# Patient Record
Sex: Female | Born: 1985 | Hispanic: No | Marital: Married | Smoking: Never smoker
Health system: Southern US, Community
[De-identification: ages and names within clinical notes are randomized; demographics above are authoritative.]

## PROBLEM LIST (undated history)

## (undated) ENCOUNTER — Inpatient Hospital Stay (HOSPITAL_COMMUNITY): Payer: Self-pay

## (undated) DIAGNOSIS — K759 Inflammatory liver disease, unspecified: Secondary | ICD-10-CM

## (undated) DIAGNOSIS — K219 Gastro-esophageal reflux disease without esophagitis: Secondary | ICD-10-CM

## (undated) DIAGNOSIS — T8859XA Other complications of anesthesia, initial encounter: Secondary | ICD-10-CM

## (undated) DIAGNOSIS — M1990 Unspecified osteoarthritis, unspecified site: Secondary | ICD-10-CM

## (undated) DIAGNOSIS — R002 Palpitations: Secondary | ICD-10-CM

## (undated) DIAGNOSIS — E559 Vitamin D deficiency, unspecified: Secondary | ICD-10-CM

## (undated) DIAGNOSIS — B181 Chronic viral hepatitis B without delta-agent: Secondary | ICD-10-CM

## (undated) DIAGNOSIS — R12 Heartburn: Secondary | ICD-10-CM

## (undated) DIAGNOSIS — M549 Dorsalgia, unspecified: Secondary | ICD-10-CM

## (undated) DIAGNOSIS — T4145XA Adverse effect of unspecified anesthetic, initial encounter: Secondary | ICD-10-CM

## (undated) DIAGNOSIS — J189 Pneumonia, unspecified organism: Secondary | ICD-10-CM

## (undated) DIAGNOSIS — E669 Obesity, unspecified: Secondary | ICD-10-CM

## (undated) DIAGNOSIS — E119 Type 2 diabetes mellitus without complications: Secondary | ICD-10-CM

## (undated) DIAGNOSIS — K589 Irritable bowel syndrome without diarrhea: Secondary | ICD-10-CM

## (undated) DIAGNOSIS — D649 Anemia, unspecified: Secondary | ICD-10-CM

## (undated) DIAGNOSIS — R609 Edema, unspecified: Secondary | ICD-10-CM

## (undated) HISTORY — DX: Anemia, unspecified: D64.9

## (undated) HISTORY — PX: CARPAL TUNNEL RELEASE: SHX101

## (undated) HISTORY — DX: Pneumonia, unspecified organism: J18.9

## (undated) HISTORY — DX: Irritable bowel syndrome, unspecified: K58.9

## (undated) HISTORY — DX: Dorsalgia, unspecified: M54.9

## (undated) HISTORY — DX: Edema, unspecified: R60.9

## (undated) HISTORY — DX: Heartburn: R12

## (undated) HISTORY — DX: Palpitations: R00.2

## (undated) HISTORY — DX: Obesity, unspecified: E66.9

## (undated) HISTORY — DX: Unspecified osteoarthritis, unspecified site: M19.90

## (undated) HISTORY — DX: Gastro-esophageal reflux disease without esophagitis: K21.9

## (undated) HISTORY — DX: Chronic viral hepatitis B without delta-agent: B18.1

## (undated) HISTORY — DX: Adverse effect of unspecified anesthetic, initial encounter: T41.45XA

## (undated) HISTORY — DX: Vitamin D deficiency, unspecified: E55.9

## (undated) HISTORY — DX: Other complications of anesthesia, initial encounter: T88.59XA

## (undated) HISTORY — PX: OTHER SURGICAL HISTORY: SHX169

## (undated) HISTORY — DX: Type 2 diabetes mellitus without complications: E11.9

---

## 2012-06-22 NOTE — L&D Delivery Note (Signed)
Delivery Note At 8:30 PM a viable female was delivered via Vaginal, Spontaneous Delivery (Presentation: ; Occiput Anterior).  APGAR: 8, 9; weight TBD.   Placenta status: Intact, Spontaneous.  Cord: 3 vessels with the following complications: None.    Anesthesia: Epidural  Episiotomy: None Lacerations: 2nd degree;Perineal Suture Repair: 3.0 vicryl Est. Blood Loss (mL): 400cc  Mom to postpartum.  Baby to nursery-stable.  Pt c/c/+2 and febrile with chorioamnionitis.  She pushed with good maternal effort to have a liveborn female via NSVD.  Placenta delivered spontaneously intact with 3 vessel cord.  Apgars 8/9. 2nd degree perineal repaired with 3.0 vicryl with good hemostatic result.  No other complications.   Taiesha Bovard L 04/02/2013, 9:16 PM

## 2013-03-06 ENCOUNTER — Encounter (HOSPITAL_COMMUNITY): Payer: Self-pay

## 2013-03-06 ENCOUNTER — Other Ambulatory Visit: Payer: Self-pay | Admitting: Advanced Practice Midwife

## 2013-03-06 ENCOUNTER — Inpatient Hospital Stay (HOSPITAL_COMMUNITY)
Admission: AD | Admit: 2013-03-06 | Discharge: 2013-03-06 | Disposition: A | Discharge status: Home / Self Care | Payer: Medicaid Other | Source: Ambulatory Visit | Attending: Obstetrics and Gynecology | Admitting: Obstetrics and Gynecology

## 2013-03-06 DIAGNOSIS — K5900 Constipation, unspecified: Secondary | ICD-10-CM | POA: Insufficient documentation

## 2013-03-06 DIAGNOSIS — N949 Unspecified condition associated with female genital organs and menstrual cycle: Secondary | ICD-10-CM

## 2013-03-06 DIAGNOSIS — O99891 Other specified diseases and conditions complicating pregnancy: Secondary | ICD-10-CM | POA: Insufficient documentation

## 2013-03-06 DIAGNOSIS — R109 Unspecified abdominal pain: Secondary | ICD-10-CM | POA: Insufficient documentation

## 2013-03-06 DIAGNOSIS — O212 Late vomiting of pregnancy: Secondary | ICD-10-CM | POA: Insufficient documentation

## 2013-03-06 DIAGNOSIS — R002 Palpitations: Secondary | ICD-10-CM | POA: Insufficient documentation

## 2013-03-06 LAB — URINALYSIS, ROUTINE W REFLEX MICROSCOPIC
Glucose, UA: NEGATIVE mg/dL
Hgb urine dipstick: NEGATIVE
Leukocytes, UA: NEGATIVE
Protein, ur: NEGATIVE mg/dL
pH: 6.5 (ref 5.0–8.0)

## 2013-03-06 MED ORDER — METOCLOPRAMIDE HCL 10 MG PO TABS: 10.0000 mg | ORAL_TABLET | Freq: Every day | ORAL | Status: DC

## 2013-03-06 NOTE — MAU Note (Signed)
Patient states she has gotten prenatal care in [place]. Has been having abdominal and back pain for about 3 days. States she has had nausea for the entire pregnancy and has been taking medication but is out of it. Now having nausea and vomiting again. Denies bleeding or leaking and reports good fetal movement.

## 2013-03-06 NOTE — MAU Provider Note (Signed)
History     CSN: [id number]  Arrival date and time: 03/06/13 1301   None     Chief Complaint  Patient presents with  . Abdominal Pain  . Nausea  . Back Pain  27 y.o. G1P0 @[redacted]w[redacted]d    HPI Comments: [name] presents today with abdominal pain and nausea with vomiting. She has pain in lower abdomen described as pulling, made worse by sitting down and sitting down while laying on her side. If she stays on her side, she feels burning pain down the back of her leg. Takes prenatal vitamins but ran out. She has also had nausea for the past two days with one episode of vomiting this morning. She takes Metoclopramide 10mg  QD for nausea, which has helped, but ran out two days ago.She drinks about 8 oz of water because she feels nauseated if she drinks more. Has not been able to eat regularly for two days. She has had prenatal care in [place] but is not able to obtain her records. She has been in the [place] for two days. Says reports having a negative HIV, RPR, and GC/CT and is Rubella immune, but does not have records. She reports good fetal movement, no contractions, no vaginal discharge, no LOF, no vaginal bleeding. She has no diarrhea. She has constipation. She also has palpitations that occur 5-6 times per day, with no chest pain or shortness of breath. She has a history of these palpitations since she was 27 years old. Sometimes they wake her from her sleep, but she does not take any medication. She is currently having palpitations. She has no blurry vision, no headache and no RUQ pain.   OB History   Grav Para Term Preterm Abortions TAB SAB Ect Mult Living   1               History reviewed. No pertinent past medical history.  History reviewed. No pertinent past surgical history.  Family History  Problem Relation Age of Onset  . Alcohol abuse Neg Hx   . Arthritis Neg Hx   . Asthma Neg Hx   . Birth defects Neg Hx   . Cancer Neg Hx   . COPD Neg Hx   . Depression Neg Hx   .  Diabetes Neg Hx   . Drug abuse Neg Hx   . Early death Neg Hx   . Hearing loss Neg Hx   . Heart disease Neg Hx   . Hyperlipidemia Neg Hx   . Hypertension Neg Hx   . Kidney disease Neg Hx   . Learning disabilities Neg Hx   . Mental illness Neg Hx   . Mental retardation Neg Hx   . Miscarriages / Stillbirths Neg Hx   . Stroke Neg Hx   . Vision loss Neg Hx     History  Substance Use Topics  . Smoking status: Never Smoker   . Smokeless tobacco: Not on file  . Alcohol Use: No    Allergies: Allergies not on file  No prescriptions prior to admission    Review of Systems  Constitutional: Negative for fever and chills.  Eyes: Negative for blurred vision and double vision.  Respiratory: Negative for shortness of breath.   Cardiovascular: Positive for palpitations. Negative for chest pain and leg swelling.  Gastrointestinal: Positive for nausea, vomiting, abdominal pain and constipation. Negative for diarrhea.  Neurological: Negative for dizziness and loss of consciousness.   Physical Exam   Blood pressure 97/78, pulse 90, temperature  98.3 F (36.8 C), temperature source Oral, resp. rate 16, height 5' 0.25" (1.53 m), weight 97.07 kg (214 lb), SpO2 100.00%.  Physical Exam  Nursing note and vitals reviewed. Constitutional: She is oriented to person, place, and time. She appears well-developed and well-nourished. No distress.  HENT:  Mouth/Throat: Oropharynx is clear and moist.  Cardiovascular: Normal rate, regular rhythm and normal heart sounds.   No murmur heard. Respiratory: Effort normal and breath sounds normal. No respiratory distress. She has no wheezes.  GI: Soft. Bowel sounds are normal. There is no tenderness. There is no rebound and no guarding.  Musculoskeletal: Normal range of motion. She exhibits edema. She exhibits no tenderness.  Neurological: She is alert and oriented to person, place, and time.  Skin: Skin is warm and dry. She is not diaphoretic. No erythema.    Dilation: Closed Effacement (%): Thick Cervical Position: Posterior Station: -3 Exam by:: Dr Netty/g morris RN  FHT: 140bpm, moderate variability, +accels, no decels, category 1 UC: none   MAU Course  Procedures 12-lead EKG for complaint of palpitations  MDM - cervical exam to check if she is dilated   Assessment and Plan   27 yo G1P0 at [redacted]w[redacted]d with round ligament pain  Assessment: #Round ligament pain #Nausea & vomiting    Plan - restarting her Reglan for her nausea and vomiting since it has worked in the past - encouraged to keep hydrated - given information about round ligament pain - will follow-up in clinic for continued prenatal care   Jacquelin Hawking, MD 03/06/2013, 2:33 PM   I have seen this patient and agree with the above resident's note. Message sent to clinic for pt to establish care.  LEFTWICH-KIRBY, Courtland Reas Certified Nurse-Midwife

## 2013-03-09 NOTE — MAU Provider Note (Signed)
Attestation of Attending Supervision of Advanced Practitioner (CNM/NP): Evaluation and management procedures were performed by the Advanced Practitioner under my supervision and collaboration.  I have reviewed the Advanced Practitioner's note and chart, and I agree with the management and plan.  Tanishi Nault 03/09/2013 8:30 AM   

## 2013-03-13 ENCOUNTER — Ambulatory Visit (INDEPENDENT_AMBULATORY_CARE_PROVIDER_SITE_OTHER): Payer: Medicaid Other | Admitting: Obstetrics & Gynecology

## 2013-03-13 ENCOUNTER — Encounter: Payer: Self-pay | Admitting: Obstetrics & Gynecology

## 2013-03-13 VITALS — BP 105/73 | Temp 97.2°F | Wt 214.5 lb

## 2013-03-13 DIAGNOSIS — Z3403 Encounter for supervision of normal first pregnancy, third trimester: Secondary | ICD-10-CM | POA: Insufficient documentation

## 2013-03-13 DIAGNOSIS — O0930 Supervision of pregnancy with insufficient antenatal care, unspecified trimester: Secondary | ICD-10-CM

## 2013-03-13 LAB — POCT URINALYSIS DIP (DEVICE)
Bilirubin Urine: NEGATIVE
Glucose, UA: NEGATIVE mg/dL
Hgb urine dipstick: NEGATIVE
Ketones, ur: NEGATIVE mg/dL
Leukocytes, UA: NEGATIVE
Nitrite: NEGATIVE
Protein, ur: NEGATIVE mg/dL
Specific Gravity, Urine: 1.02 (ref 1.005–1.030)

## 2013-03-13 NOTE — Progress Notes (Signed)
Transfer care from [place].  No complications except UTI

## 2013-03-13 NOTE — Progress Notes (Signed)
Pulse- 97  Edema-feet and sometimes fingers  Pain-in legs at night Weight gain 11-20lbs New ob packet given

## 2013-03-13 NOTE — Patient Instructions (Signed)

## 2013-03-14 LAB — HEMOGLOBINOPATHY EVALUATION
Hemoglobin Other: 2.1 % — ABNORMAL HIGH
Hgb A2 Quant: 0.3 % — ABNORMAL LOW (ref 2.2–3.2)
Hgb A: 97.6 % (ref 96.8–97.8)
Hgb S Quant: 0 %

## 2013-03-14 LAB — OBSTETRIC PANEL
Basophils Absolute: 0 10*3/uL (ref 0.0–0.1)
Eosinophils Absolute: 0.1 10*3/uL (ref 0.0–0.7)
Eosinophils Relative: 1 % (ref 0–5)
MCH: 21.1 pg — ABNORMAL LOW (ref 26.0–34.0)
MCHC: 31.3 g/dL (ref 30.0–36.0)
MCV: 67.4 fL — ABNORMAL LOW (ref 78.0–100.0)
Platelets: 140 10*3/uL — ABNORMAL LOW (ref 150–400)
RDW: 16 % — ABNORMAL HIGH (ref 11.5–15.5)

## 2013-03-14 LAB — CULTURE, OB URINE

## 2013-03-16 LAB — HGB ELECTROPHORESIS REFLEXED REPORT: Hemoglobin A2 - HGBRFX: 2.1 % (ref 1.8–3.5)

## 2013-03-23 ENCOUNTER — Encounter: Payer: Self-pay | Admitting: Obstetrics & Gynecology

## 2013-03-27 ENCOUNTER — Encounter: Payer: Self-pay | Admitting: General Practice

## 2013-03-27 ENCOUNTER — Ambulatory Visit (INDEPENDENT_AMBULATORY_CARE_PROVIDER_SITE_OTHER): Payer: Medicaid Other | Admitting: Advanced Practice Midwife

## 2013-03-27 VITALS — BP 116/67 | Temp 97.5°F | Wt 216.1 lb

## 2013-03-27 DIAGNOSIS — Z23 Encounter for immunization: Secondary | ICD-10-CM

## 2013-03-27 DIAGNOSIS — Z3403 Encounter for supervision of normal first pregnancy, third trimester: Secondary | ICD-10-CM

## 2013-03-27 DIAGNOSIS — IMO0002 Reserved for concepts with insufficient information to code with codable children: Secondary | ICD-10-CM

## 2013-03-27 DIAGNOSIS — O1203 Gestational edema, third trimester: Secondary | ICD-10-CM

## 2013-03-27 LAB — POCT URINALYSIS DIP (DEVICE)
Bilirubin Urine: NEGATIVE
Nitrite: NEGATIVE
Urobilinogen, UA: 0.2 mg/dL (ref 0.0–1.0)
pH: 5.5 (ref 5.0–8.0)

## 2013-03-27 LAB — OB RESULTS CONSOLE GBS: GBS: NEGATIVE

## 2013-03-27 NOTE — Progress Notes (Signed)
Doing well. Needs proof of pregnancy letter. Getting flu shot today. Plans to breastfeed. GBS done. Other cultures done a month ago. 2+ pedal edema, elevate. BP normal

## 2013-03-27 NOTE — Progress Notes (Signed)
P= 91 Edema noted in feet, pt. States that it was pitting earlier in legs and feet. Reports pain/ pressure at hips and pelvic area.  Pt. Failed 1hr glucose from last visit, will need to schedule and appointment for 3hr; pt. Informed and verbalizes understanding.  Pt. Wants flu vaccine.

## 2013-03-27 NOTE — Addendum Note (Signed)
Addended by: Louanna Raw on: 03/27/2013 01:26 PM   Modules accepted: Orders

## 2013-03-27 NOTE — Patient Instructions (Signed)
Edema  Edema is an abnormal build-up of fluids in tissues. Because this is partly dependent on gravity (water flows to the lowest place), it is more common in the legs and thighs (lower extremities). It is also common in the looser tissues, like around the eyes. Painless swelling of the feet and ankles is common and increases as a person ages. It may affect both legs and may include the calves or even thighs. When squeezed, the fluid may move out of the affected area and may leave a dent for a few moments.  CAUSES    Prolonged standing or sitting in one place for extended periods of time. Movement helps pump tissue fluid into the veins, and absence of movement prevents this, resulting in edema.   Varicose veins. The valves in the veins do not work as well as they should. This causes fluid to leak into the tissues.   Fluid and salt overload.   Injury, burn, or surgery to the leg, ankle, or foot, may damage veins and allow fluid to leak out.   Sunburn damages vessels. Leaky vessels allow fluid to go out into the sunburned tissues.   Allergies (from insect bites or stings, medications or chemicals) cause swelling by allowing vessels to become leaky.   Protein in the blood helps keep fluid in your vessels. Low protein, as in malnutrition, allows fluid to leak out.   Hormonal changes, including pregnancy and menstruation, cause fluid retention. This fluid may leak out of vessels and cause edema.   Medications that cause fluid retention. Examples are sex hormones, blood pressure medications, steroid treatment, or anti-depressants.   Some illnesses cause edema, especially heart failure, kidney disease, or liver disease.   Surgery that cuts veins or lymph nodes, such as surgery done for the heart or for breast cancer, may result in edema.  DIAGNOSIS   Your caregiver is usually easily able to determine what is causing your swelling (edema) by simply asking what is wrong (getting a history) and examining you (doing  a physical). Sometimes x-rays, EKG (electrocardiogram or heart tracing), and blood work may be done to evaluate for underlying medical illness.  TREATMENT   General treatment includes:   Leg elevation (or elevation of the affected body part).   Restriction of fluid intake.   Prevention of fluid overload.   Compression of the affected body part. Compression with elastic bandages or support stockings squeezes the tissues, preventing fluid from entering and forcing it back into the blood vessels.   Diuretics (also called water pills or fluid pills) pull fluid out of your body in the form of increased urination. These are effective in reducing the swelling, but can have side effects and must be used only under your caregiver's supervision. Diuretics are appropriate only for some types of edema.  The specific treatment can be directed at any underlying causes discovered. Heart, liver, or kidney disease should be treated appropriately.  HOME CARE INSTRUCTIONS    Elevate the legs (or affected body part) above the level of the heart, while lying down.   Avoid sitting or standing still for prolonged periods of time.   Avoid putting anything directly under the knees when lying down, and do not wear constricting clothing or garters on the upper legs.   Exercising the legs causes the fluid to work back into the veins and lymphatic channels. This may help the swelling go down.   The pressure applied by elastic bandages or support stockings can help reduce ankle swelling.     A low-salt diet may help reduce fluid retention and decrease the ankle swelling.   Take any medications exactly as prescribed.  SEEK MEDICAL CARE IF:   Your edema is not responding to recommended treatments.  SEEK IMMEDIATE MEDICAL CARE IF:    You develop shortness of breath or chest pain.   You cannot breathe when you lay down; or if, while lying down, you have to get up and go to the window to get your breath.   You are having increasing  swelling without relief from treatment.   You develop a fever over 102 F (38.9 C).   You develop pain or redness in the areas that are swollen.   Tell your caregiver right away if you have gained 3 lb/1.4 kg in 1 day or 5 lb/2.3 kg in a week.  MAKE SURE YOU:    Understand these instructions.   Will watch your condition.   Will get help right away if you are not doing well or get worse.  Document Released: 06/08/2005 Document Revised: 12/08/2011 Document Reviewed: 01/25/2008  [employer] Patient Information 2014 [employer].

## 2013-03-28 ENCOUNTER — Encounter: Payer: Self-pay | Admitting: Advanced Practice Midwife

## 2013-03-30 ENCOUNTER — Inpatient Hospital Stay (HOSPITAL_COMMUNITY)
Admission: AD | Admit: 2013-03-30 | Discharge: 2013-03-30 | Disposition: A | Discharge status: Home / Self Care | Payer: Medicaid Other | Source: Ambulatory Visit | Attending: Family Medicine | Admitting: Family Medicine

## 2013-03-30 ENCOUNTER — Encounter (HOSPITAL_COMMUNITY): Payer: Self-pay | Admitting: *Deleted

## 2013-03-30 DIAGNOSIS — O4700 False labor before 37 completed weeks of gestation, unspecified trimester: Secondary | ICD-10-CM | POA: Insufficient documentation

## 2013-03-30 DIAGNOSIS — R109 Unspecified abdominal pain: Secondary | ICD-10-CM | POA: Insufficient documentation

## 2013-03-30 NOTE — MAU Note (Addendum)
Started having pain in lower abd  Yesterday evening. Becoming more regular, stronger and closer.  Gush of clear fluid at 1100 yesterday, none since

## 2013-03-31 ENCOUNTER — Encounter (HOSPITAL_COMMUNITY): Payer: Self-pay | Admitting: *Deleted

## 2013-03-31 ENCOUNTER — Inpatient Hospital Stay (HOSPITAL_COMMUNITY)
Admission: AD | Admit: 2013-03-31 | Discharge: 2013-03-31 | Disposition: A | Discharge status: Home / Self Care | Payer: Medicaid Other | Source: Ambulatory Visit | Attending: Family Medicine | Admitting: Family Medicine

## 2013-03-31 DIAGNOSIS — R109 Unspecified abdominal pain: Secondary | ICD-10-CM | POA: Insufficient documentation

## 2013-03-31 DIAGNOSIS — O4703 False labor before 37 completed weeks of gestation, third trimester: Secondary | ICD-10-CM

## 2013-03-31 DIAGNOSIS — O4700 False labor before 37 completed weeks of gestation, unspecified trimester: Secondary | ICD-10-CM | POA: Insufficient documentation

## 2013-03-31 MED ORDER — BUTORPHANOL TARTRATE 1 MG/ML IJ SOLN
2.0000 mg | INTRAMUSCULAR | Status: DC | PRN
  Administered 2013-03-31: 2 mg via INTRAMUSCULAR
  Filled 2013-03-31: qty 2

## 2013-03-31 MED ORDER — ACETAMINOPHEN 325 MG PO TABS
650.0000 mg | ORAL_TABLET | Freq: Once | ORAL | Status: AC
  Administered 2013-03-31: 650 mg via ORAL
  Filled 2013-03-31: qty 2

## 2013-03-31 NOTE — MAU Note (Signed)
Pt had states earlier that she felt better, when RN entered room pt was sleeping. Cervix rechecked. Instructed pt RN would call MD but that she would be going home, EFM dced and pt told she can get up to dress. VSS. Pt then states she is hurting and doesn't think she can go home with this pain. Call to md , will give pt tylenol and d/c to home. Pt states she is hurting too bad to go home. Same reported to MD. Dr Michail Jewels in to talk to pt.

## 2013-03-31 NOTE — MAU Provider Note (Signed)
History     CSN: [id number]  Arrival date and time: 03/31/13 0248  Chief Complaint  Patient presents with  . Labor Eval   HPI [name] is a 27y.o G1P0 at 36w3 who presented with worsening abdominal pain for the past 2 days. Pt started to notice contractions become closer together, worsening in intensity. Feeling good fetal movement. No vaginal bleeding or discharge.   OB History   Grav Para Term Preterm Abortions TAB SAB Ect Mult Living   1              1. Current  History reviewed. No pertinent past medical history.  History reviewed. No pertinent past surgical history.  Family History  Problem Relation Age of Onset  . Alcohol abuse Neg Hx   . Arthritis Neg Hx   . Asthma Neg Hx   . Birth defects Neg Hx   . Cancer Neg Hx   . COPD Neg Hx   . Depression Neg Hx   . Diabetes Neg Hx   . Drug abuse Neg Hx   . Early death Neg Hx   . Hearing loss Neg Hx   . Heart disease Neg Hx   . Hyperlipidemia Neg Hx   . Hypertension Neg Hx   . Kidney disease Neg Hx   . Learning disabilities Neg Hx   . Mental illness Neg Hx   . Mental retardation Neg Hx   . Miscarriages / Stillbirths Neg Hx   . Stroke Neg Hx   . Vision loss Neg Hx     History  Substance Use Topics  . Smoking status: Never Smoker   . Smokeless tobacco: Not on file  . Alcohol Use: No    Allergies: No Known Allergies  Prescriptions prior to admission  Medication Sig Dispense Refill  . metoCLOPramide (REGLAN) 10 MG tablet Take 1 tablet (10 mg total) by mouth daily.  30 tablet  2  . Prenatal Vit-Fe Fumarate-FA (PRENATAL MULTIVITAMIN) TABS tablet Take 1 tablet by mouth daily at 12 noon.        Review of Systems  Constitutional: Negative for fever and malaise/fatigue.  HENT: Negative for congestion and sore throat.   Eyes: Negative for blurred vision and discharge.  Respiratory: Negative for cough and wheezing.   Cardiovascular: Negative for chest pain and palpitations.  Gastrointestinal: Positive for  abdominal pain. Negative for heartburn and constipation.  Genitourinary: Negative for dysuria, urgency and frequency.  Musculoskeletal: Negative for myalgias.  Skin: Negative for rash.  Neurological: Negative for dizziness, seizures, loss of consciousness, weakness and headaches.  Endo/Heme/Allergies: Does not bruise/bleed easily.  Psychiatric/Behavioral: Negative for depression.   Physical Exam   Blood pressure 109/74, pulse 83, temperature 97.8 F (36.6 C), temperature source Oral, resp. rate 22, height 5' (1.524 m), weight 97.977 kg (216 lb), last menstrual period 07/18/2012, SpO2 100.00%.  Physical Exam  Constitutional: She is oriented to person, place, and time. She appears well-developed and well-nourished.  HENT:  Head: Normocephalic and atraumatic.  Eyes: EOM are normal.  Neck: Neck supple.  Cardiovascular: Normal rate.   Respiratory: Effort normal. No respiratory distress.  GI: Soft. There is tenderness.  Musculoskeletal: Normal range of motion.  Neurological: She is alert and oriented to person, place, and time.  Skin: Skin is warm and dry.    MAU Course  Procedures  MDM Cervical exam, 1, 90, -1 anterior by [facility] RN Stadol 2gm IM q3  FHR- baseline 125, mod variable, post accels, no decls Uterine contractions irreg  every 2-4 mins Assessment and Plan  Pt is a 27y.o G1P0 at 36w3 presenting for contractions prior to term  1. Abdominal pain- false labor before 37wks; not having consistent contraction pattern nor making active cervical change upon several reassessments -s/p 2gm stadol, pain initially improved only to 8/10 but upon reassessment pt appearing much more comfortable -reviewed labor precautions with pt -encouraged to f/up at sched prenatal apptmt  2. FHR tracing- reassuring -category I  Anselm Lis 03/31/2013, 4:12 AM   I have seen and examined this patient and agree the above assessment. CRESENZO-DISHMAN,Shontae Rosiles 03/31/2013 9:03 AM

## 2013-03-31 NOTE — MAU Note (Signed)
Worsening contractions.  

## 2013-03-31 NOTE — MAU Provider Note (Signed)
Chart reviewed and agree with management and plan.  

## 2013-04-02 ENCOUNTER — Encounter (HOSPITAL_COMMUNITY): Payer: Medicaid Other | Admitting: Anesthesiology

## 2013-04-02 ENCOUNTER — Encounter (HOSPITAL_COMMUNITY): Payer: Self-pay | Admitting: *Deleted

## 2013-04-02 ENCOUNTER — Inpatient Hospital Stay (HOSPITAL_COMMUNITY)
Admission: AD | Admit: 2013-04-02 | Discharge: 2013-04-04 | DRG: 774 | Disposition: A | Discharge status: Home / Self Care | Payer: Medicaid Other | Source: Ambulatory Visit | Attending: Obstetrics & Gynecology | Admitting: Obstetrics & Gynecology

## 2013-04-02 ENCOUNTER — Inpatient Hospital Stay (HOSPITAL_COMMUNITY): Payer: Medicaid Other | Admitting: Anesthesiology

## 2013-04-02 DIAGNOSIS — O411090 Infection of amniotic sac and membranes, unspecified, unspecified trimester, not applicable or unspecified: Secondary | ICD-10-CM

## 2013-04-02 DIAGNOSIS — B1910 Unspecified viral hepatitis B without hepatic coma: Secondary | ICD-10-CM

## 2013-04-02 LAB — CBC
HCT: 34.8 % — ABNORMAL LOW (ref 36.0–46.0)
MCHC: 31.6 g/dL (ref 30.0–36.0)
Platelets: 174 10*3/uL (ref 150–400)
RBC: 5.15 MIL/uL — ABNORMAL HIGH (ref 3.87–5.11)
RDW: 15.6 % — ABNORMAL HIGH (ref 11.5–15.5)
WBC: 12.3 10*3/uL — ABNORMAL HIGH (ref 4.0–10.5)

## 2013-04-02 LAB — URINE MICROSCOPIC-ADD ON

## 2013-04-02 LAB — URINALYSIS, ROUTINE W REFLEX MICROSCOPIC
Ketones, ur: >80 mg/dL — AB
Protein, ur: NEGATIVE mg/dL
Urobilinogen, UA: 0.2 mg/dL (ref 0.0–1.0)

## 2013-04-02 LAB — RPR: RPR Ser Ql: NONREACTIVE

## 2013-04-02 LAB — ABO/RH: ABO/RH(D): O POS

## 2013-04-02 LAB — GLUCOSE, CAPILLARY: Glucose-Capillary: 96 mg/dL (ref 70–99)

## 2013-04-02 MED ORDER — OXYTOCIN 40 UNITS IN LACTATED RINGERS INFUSION - SIMPLE MED
62.5000 mL/h | INTRAVENOUS | Status: DC
  Filled 2013-04-02: qty 1000

## 2013-04-02 MED ORDER — LIDOCAINE HCL (PF) 1 % IJ SOLN
INTRAMUSCULAR | Status: DC | PRN
  Administered 2013-04-02 (×4): 4 mL

## 2013-04-02 MED ORDER — FENTANYL 2.5 MCG/ML BUPIVACAINE 1/10 % EPIDURAL INFUSION (WH - ANES)
14.0000 mL/h | INTRAMUSCULAR | Status: DC | PRN
  Administered 2013-04-02: 14 mL/h via EPIDURAL
  Filled 2013-04-02: qty 125

## 2013-04-02 MED ORDER — PHENYLEPHRINE 40 MCG/ML (10ML) SYRINGE FOR IV PUSH (FOR BLOOD PRESSURE SUPPORT)
80.0000 ug | PREFILLED_SYRINGE | INTRAVENOUS | Status: DC | PRN
  Administered 2013-04-02: 80 ug via INTRAVENOUS
  Filled 2013-04-02: qty 2

## 2013-04-02 MED ORDER — LACTATED RINGERS IV SOLN: 500.0000 mL | Freq: Once | INTRAVENOUS | Status: DC

## 2013-04-02 MED ORDER — EPHEDRINE 5 MG/ML INJ
10.0000 mg | INTRAVENOUS | Status: DC | PRN
  Filled 2013-04-02: qty 2

## 2013-04-02 MED ORDER — ONDANSETRON HCL 4 MG/2ML IJ SOLN: 4.0000 mg | Freq: Four times a day (QID) | INTRAMUSCULAR | Status: DC | PRN

## 2013-04-02 MED ORDER — GENTAMICIN SULFATE 40 MG/ML IJ SOLN
150.0000 mg | Freq: Once | INTRAVENOUS | Status: AC
  Administered 2013-04-02: 150 mg via INTRAVENOUS
  Filled 2013-04-02: qty 3.75

## 2013-04-02 MED ORDER — TERBUTALINE SULFATE 1 MG/ML IJ SOLN: 0.2500 mg | Freq: Once | INTRAMUSCULAR | Status: AC | PRN

## 2013-04-02 MED ORDER — EPHEDRINE 5 MG/ML INJ
10.0000 mg | INTRAVENOUS | Status: DC | PRN
  Filled 2013-04-02: qty 2
  Filled 2013-04-02: qty 4

## 2013-04-02 MED ORDER — GENTAMICIN SULFATE 40 MG/ML IJ SOLN
140.0000 mg | Freq: Three times a day (TID) | INTRAVENOUS | Status: DC
  Filled 2013-04-02 (×2): qty 3.5

## 2013-04-02 MED ORDER — OXYTOCIN BOLUS FROM INFUSION
500.0000 mL | INTRAVENOUS | Status: DC
  Administered 2013-04-02: 500 mL via INTRAVENOUS

## 2013-04-02 MED ORDER — OXYTOCIN 40 UNITS IN LACTATED RINGERS INFUSION - SIMPLE MED
1.0000 m[IU]/min | INTRAVENOUS | Status: DC
  Administered 2013-04-02: 2 m[IU]/min via INTRAVENOUS

## 2013-04-02 MED ORDER — LIDOCAINE HCL (PF) 1 % IJ SOLN
30.0000 mL | INTRAMUSCULAR | Status: DC | PRN
  Filled 2013-04-02 (×2): qty 30

## 2013-04-02 MED ORDER — FENTANYL CITRATE 0.05 MG/ML IJ SOLN
100.0000 ug | INTRAMUSCULAR | Status: DC | PRN
  Administered 2013-04-02: 100 ug via INTRAVENOUS
  Filled 2013-04-02: qty 2

## 2013-04-02 MED ORDER — OXYCODONE-ACETAMINOPHEN 5-325 MG PO TABS: 1.0000 | ORAL_TABLET | ORAL | Status: DC | PRN

## 2013-04-02 MED ORDER — LACTATED RINGERS IV SOLN
500.0000 mL | INTRAVENOUS | Status: DC | PRN
  Administered 2013-04-02: 500 mL via INTRAVENOUS

## 2013-04-02 MED ORDER — SODIUM CHLORIDE 0.9 % IV SOLN
2.0000 g | Freq: Four times a day (QID) | INTRAVENOUS | Status: DC
  Administered 2013-04-02: 2 g via INTRAVENOUS
  Filled 2013-04-02 (×4): qty 2000

## 2013-04-02 MED ORDER — DIPHENHYDRAMINE HCL 50 MG/ML IJ SOLN: 12.5000 mg | INTRAMUSCULAR | Status: DC | PRN

## 2013-04-02 MED ORDER — PHENYLEPHRINE 40 MCG/ML (10ML) SYRINGE FOR IV PUSH (FOR BLOOD PRESSURE SUPPORT)
80.0000 ug | PREFILLED_SYRINGE | INTRAVENOUS | Status: DC | PRN
  Filled 2013-04-02: qty 2
  Filled 2013-04-02: qty 5

## 2013-04-02 MED ORDER — CITRIC ACID-SODIUM CITRATE 334-500 MG/5ML PO SOLN: 30.0000 mL | ORAL | Status: DC | PRN

## 2013-04-02 MED ORDER — IBUPROFEN 600 MG PO TABS
600.0000 mg | ORAL_TABLET | Freq: Four times a day (QID) | ORAL | Status: DC | PRN
  Administered 2013-04-02: 600 mg via ORAL
  Filled 2013-04-02: qty 1

## 2013-04-02 MED ORDER — ACETAMINOPHEN 325 MG PO TABS
650.0000 mg | ORAL_TABLET | ORAL | Status: DC | PRN
  Administered 2013-04-02: 650 mg via ORAL
  Filled 2013-04-02: qty 2

## 2013-04-02 MED ORDER — LACTATED RINGERS IV SOLN
INTRAVENOUS | Status: DC
  Administered 2013-04-02 (×2): via INTRAVENOUS

## 2013-04-02 NOTE — Progress Notes (Signed)
ANTIBIOTIC CONSULT NOTE - INITIAL  Pharmacy Consult for  Gentamicin Indication: r/o Chorioamnionitis  No Known Allergies  Patient Measurements: Height: 5' (152.4 cm) Weight: 215 lb (97.523 kg) IBW/kg (Calculated) : 45.5 Dosing Body Weight: 61.1 kg  Vital Signs: Temp: 100.7 F (38.2 C) (10/12 1930) Temp src: Axillary (10/12 1930) BP: 116/77 mmHg (10/12 2004) Pulse Rate: 126 (10/12 2004)   Labs:  Recent Labs  04/02/13 0900  WBC 12.3*  HGB 11.0*  PLT 174   CrCl is unknown because no creatinine reading has been taken. Estimated Serum Creatinine ~ 0.8 mg%  Microbiology: Recent Results (from the past 720 hour(s))  CULTURE, OB URINE     Status: None   Collection Time    03/13/13 12:35 PM      Result Value Range Status   Colony Count >=100,000 COLONIES/ML   Final   Organism ID, Bacteria Multiple bacterial morphotypes present, none   Final   Organism ID, Bacteria predominant. Suggest appropriate recollection if    Final   Organism ID, Bacteria clinically indicated.   Final  CULTURE, BETA STREP (GROUP B ONLY)     Status: None   Collection Time    03/27/13 12:30 PM      Result Value Range Status   Organism ID, Bacteria NO GROUP B STREP (S.AGALACTIAE) ISOLATED   Final    Medical History: History reviewed. No pertinent past medical history.  Medications: Ampicillin 2gm IV Q6 hours.  Assessment: r/o chorioamnionitis, increase temp=100*7   Goal of Therapy: Peak ~6-7mg /L, Trough < 1mg /L  Plan: 1. Load Gentamicin 150mg . 2. Maintenance Gent 140mg  IV Q 8 hours to begin 8 hours after load. 3. Draw SCr if continued.   Hovey-Rankin, Marthe Dant 04/02/2013,9:02 PM

## 2013-04-02 NOTE — Progress Notes (Signed)
Morgan [name] is a 27 y.o. G1P0 at [redacted]w[redacted]d admitted for Preterm labor  Subjective: Pt more comfortable after epidural but unable to feel figengers and hands. Pt eing reevaluated by anethesia  Objective: BP 123/72  Pulse 219  Temp(Src) 98.5 F (36.9 C) (Oral)  Resp 18  Ht 5' (1.524 m)  Wt 97.523 kg (215 lb)  BMI 41.99 kg/m2  SpO2 100%  LMP 07/18/2012      FHT:  FHR: 140s bpm, variability: moderate,  accelerations:  Abscent,  decelerations:  Absent UC:   regular, every 4 minutes SVE:   Dilation: 6 Effacement (%): 100 Station: -1 Exam by:: j.Thornton, RN  Labs: Lab Results  Component Value Date   WBC 12.3* 04/02/2013   HGB 11.0* 04/02/2013   HCT 34.8* 04/02/2013   MCV 67.6* 04/02/2013   PLT 174 04/02/2013    Assessment / Plan: Spontaneous labor, progressing normally  Labor: Progressing normally and expectant management Preeclampsia:  no signs or symptoms of toxicity Fetal Wellbeing:  Category I Pain Control:  Epidural and and being reevaluated by anesthesia I/D:  n/a Anticipated MOD:  NSVD  Clemens Lachman RYAN 04/02/2013, 1:13 PM

## 2013-04-02 NOTE — Anesthesia Preprocedure Evaluation (Signed)
Anesthesia Evaluation  Patient identified by MRN, date of birth, ID band Patient awake    Reviewed: Allergy & Precautions, H&P , NPO status , Patient's Chart, lab work & pertinent test results, reviewed documented beta blocker date and time   History of Anesthesia Complications Negative for: history of anesthetic complications  Airway Mallampati: I TM Distance: >3 FB Neck ROM: full    Dental  (+) Teeth Intact Removable retainer:   Pulmonary neg pulmonary ROS,  breath sounds clear to auscultation        Cardiovascular negative cardio ROS  Rhythm:regular Rate:Normal     Neuro/Psych negative neurological ROS  negative psych ROS   GI/Hepatic negative GI ROS, Neg liver ROS,   Endo/Other  Morbid obesity  Renal/GU negative Renal ROS     Musculoskeletal   Abdominal   Peds  Hematology negative hematology ROS (+)   Anesthesia Other Findings   Reproductive/Obstetrics (+) Pregnancy                           Anesthesia Physical Anesthesia Plan  ASA: III  Anesthesia Plan: Epidural   Post-op Pain Management:    Induction:   Airway Management Planned:   Additional Equipment:   Intra-op Plan:   Post-operative Plan:   Informed Consent: I have reviewed the patients History and Physical, chart, labs and discussed the procedure including the risks, benefits and alternatives for the proposed anesthesia with the patient or authorized representative who has indicated his/her understanding and acceptance.     Plan Discussed with:   Anesthesia Plan Comments:         Anesthesia Quick Evaluation

## 2013-04-02 NOTE — Progress Notes (Signed)
Spoke with dr odom aware that fhr tachy - order to give 500cc bolus and do sve

## 2013-04-02 NOTE — Anesthesia Procedure Notes (Signed)
Epidural Patient location during procedure: OB Start time: 04/02/2013 10:25 AM  Staffing Performed by: anesthesiologist   Preanesthetic Checklist Completed: patient identified, site marked, surgical consent, pre-op evaluation, timeout performed, IV checked, risks and benefits discussed and monitors and equipment checked  Epidural Patient position: sitting Prep: site prepped and draped and DuraPrep Patient monitoring: continuous pulse ox and blood pressure Approach: midline Injection technique: LOR air  Needle:  Needle type: Tuohy  Needle gauge: 17 G Needle length: 9 cm and 9 Needle insertion depth: 8 cm Catheter type: closed end flexible Catheter size: 19 Gauge Catheter at skin depth: 13 cm Test dose: negative  Assessment Events: blood not aspirated, injection not painful, no injection resistance, negative IV test and no paresthesia  Additional Notes Discussed risk of headache, infection, bleeding, nerve injury and failed or incomplete block.  Patient voices understanding and wishes to proceed.  Epidural placed easily on first attempt.  No paresthesia.  Patient tolerated procedure well with no apparent complications.  Jasmine December, MDReason for block:procedure for pain

## 2013-04-02 NOTE — MAU Note (Signed)
Pt presents with complaints of abdominal pain and back pain throughout the night she is not able to sleep due to the pain. Denies any bleeding or leakage of fluid

## 2013-04-02 NOTE — Progress Notes (Signed)
Morgan [name] is a 27 y.o. G1P0 at [redacted]w[redacted]d admitted for Preterm labor  Subjective: Pt with epidural doing well. S/p AROM at 1610 Objective: BP 125/83  Pulse 145  Temp(Src) 98 F (36.7 C) (Oral)  Resp 18  Ht 5' (1.524 m)  Wt 97.523 kg (215 lb)  BMI 41.99 kg/m2  SpO2 100%  LMP 07/18/2012      FHT:  FHR: \150s bpm, variability: moderate,  accelerations:  Abscent,  decelerations:  Absent UC:   regular, every 2-4 minutes SVE:   Dilation: 9 Effacement (%): 90 Station: 0 Exam by:: dr Ike Bene  Labs: Lab Results  Component Value Date   WBC 12.3* 04/02/2013   HGB 11.0* 04/02/2013   HCT 34.8* 04/02/2013   MCV 67.6* 04/02/2013   PLT 174 04/02/2013    Assessment / Plan: Spontaneous labor, progressing normally  Labor: Progressing normally and expectant management Preeclampsia:  no signs or symptoms of toxicity Fetal Wellbeing:  Category II for period of fetal tachycardia in last Pain Control:  Epidural and and being reevaluated by anesthesia I/D:  n/a Anticipated MOD:  NSVD  Morgan [name] 04/02/2013, 4:14 PM

## 2013-04-02 NOTE — Progress Notes (Signed)
Dr. Rodman Pickle called to bedside for pt c/o numbness in lips and hands--epidural turned off awaiting Anesthesiologist assessment

## 2013-04-02 NOTE — H&P (Signed)
Morgan [name] is a 27 y.o. female G1P0 @ [redacted]w[redacted]d presenting for continuous pain with no relief and constant back pain. She has not been able to sleep since Friday due to pain. Watery discharge started last evening, no gush of fluid or vaginal bleeding. She is feeling the baby move since admission to  MAU. She is breathing through her contractions on monitor. Started feeling contractions on Friday and have become increasingly stronger. First half of [facility] was completed in [place], she reports everything has been normal UTD.   Maternal Medical History:  Reason for admission: Contractions.   Contractions: Onset was more than 2 days ago.   Frequency: regular.   Perceived severity is moderate.    Fetal activity: Perceived fetal activity is normal.   Last perceived fetal movement was within the past hour.      OB History   Grav Para Term Preterm Abortions TAB SAB Ect Mult Living   1              History reviewed. No pertinent past medical history. History reviewed. No pertinent past surgical history. Family History: family history is negative for Alcohol abuse, Arthritis, Asthma, Birth defects, Cancer, COPD, Depression, Diabetes, Drug abuse, Early death, Hearing loss, Heart disease, Hyperlipidemia, Hypertension, Kidney disease, Learning disabilities, Mental illness, Mental retardation, Miscarriages / Stillbirths, Stroke, and Vision loss. Social History:  reports that she has never smoked. She does not have any smokeless tobacco history on file. She reports that she does not drink alcohol or use illicit drugs.   Prenatal Transfer Tool  Maternal Diabetes: No Genetic Screening: Normal Maternal Ultrasounds/Referrals: Normal; Normal Anatomy ultrasound (per patient in [place]) Fetal Ultrasounds or other Referrals:  None Maternal Substance Abuse:  No Significant Maternal Medications:  None Significant Maternal Lab Results:  Lab values include: Group B Strep negative Other Comments:  None Clinic  [facility]    Dating  LMP/Ultrasound: weeks Ultrasound consistent with LMP: Yes   Genetic Screen  1 Screen: AFP: Quad: NIPS:   Anatomic [place]  nl per pt   GTT  Early: Third trimester: 141 >> needs 3 hr test   TDaP vaccine    Flu vaccine  Given 03/27/13   GBS    Baby Food  Breast   Contraception  Mini Pill   Circumcision  yes   Pediatrician  undecided     ROS Negative, with the exception of above mentioned in HPI  Dilation: 4 Effacement (%): 100 Station: -2 Exam by:: G Morris RN Blood pressure 116/82, pulse 115, temperature 97.7 F (36.5 C), temperature source Oral, resp. rate 18, height 5' (1.524 m), weight 97.523 kg (215 lb), last menstrual period 07/18/2012. Exam Physical Exam  BP 116/82  Pulse 115  Temp(Src) 97.7 F (36.5 C) (Oral)  Resp 18  Ht 5' (1.524 m)  Wt 97.523 kg (215 lb)  BMI 41.99 kg/m2  LMP 07/18/2012 Gen: NAD. Feeling contractions and breathing through them.  HEENT: AT. [place].  Bilateral eyes without injections or icterus. MMM.  CV: RRR  Chest: CTAB, no wheeze or crackles Abd: Soft. Gravid. NTND. Marland Kitchen  Ext: No erythema. No edema.  Skin: No rashes, purpura or petechiae.  Neuro:EOMi. Alert. Grossly intact.  Psych:No signs of depression.   SVE: 5/90/-2  EFM: FHR  140's, Accelerations present, No decels noted. CAT 1 tracing.  Prenatal labs: ABO, Rh: O/POS/-- (09/22 1235) Antibody: NEG (09/22 1235) Rubella: 3.23 (09/22 1235) RPR: NON REAC (09/22 1235)  HBsAg: POSITIVE (09/22 1235)  HIV: NON REACTIVE (  09/22 1235)  GBS:   Negative  Assessment/Plan: G1P0 @ [redacted]w[redacted]d with SVE: 5/90/-2, contractions every 3-5 minutes,  admit to LD for active labor 1 hour GTT 141, 3 hour never completed. FERN: Negative GBS negative Anticipate NSVD   Kuneff, Renee 04/02/2013, 8:32 AM   FSG at 93. No additional management.  I spoke with and examined patient and agree with resident's note and plan of care.  Tawana Scale, MD OB Fellow 04/02/2013 12:19 PM

## 2013-04-03 ENCOUNTER — Encounter: Payer: Self-pay | Admitting: Obstetrics & Gynecology

## 2013-04-03 ENCOUNTER — Encounter (HOSPITAL_COMMUNITY): Payer: Self-pay | Admitting: *Deleted

## 2013-04-03 ENCOUNTER — Ambulatory Visit (HOSPITAL_COMMUNITY): Payer: Medicaid Other | Attending: Obstetrics & Gynecology

## 2013-04-03 LAB — CBC
HCT: 26.7 % — ABNORMAL LOW (ref 36.0–46.0)
Hemoglobin: 8.7 g/dL — ABNORMAL LOW (ref 12.0–15.0)
MCV: 67.3 fL — ABNORMAL LOW (ref 78.0–100.0)
RBC: 3.97 MIL/uL (ref 3.87–5.11)
RDW: 15.9 % — ABNORMAL HIGH (ref 11.5–15.5)

## 2013-04-03 LAB — URINE CULTURE
Colony Count: NO GROWTH
Culture: NO GROWTH

## 2013-04-03 LAB — HEPATIC FUNCTION PANEL
ALT: 21 U/L (ref 0–35)
Albumin: 1.9 g/dL — ABNORMAL LOW (ref 3.5–5.2)
Bilirubin, Direct: 0.2 mg/dL (ref 0.0–0.3)
Indirect Bilirubin: 0.2 mg/dL — ABNORMAL LOW (ref 0.3–0.9)
Total Protein: 4.7 g/dL — ABNORMAL LOW (ref 6.0–8.3)

## 2013-04-03 MED ORDER — ONDANSETRON HCL 4 MG PO TABS: 4.0000 mg | ORAL_TABLET | ORAL | Status: DC | PRN

## 2013-04-03 MED ORDER — SENNOSIDES-DOCUSATE SODIUM 8.6-50 MG PO TABS
2.0000 | ORAL_TABLET | ORAL | Status: DC
  Administered 2013-04-04: 2 via ORAL
  Filled 2013-04-03: qty 2

## 2013-04-03 MED ORDER — PRENATAL MULTIVITAMIN CH
1.0000 | ORAL_TABLET | Freq: Every day | ORAL | Status: DC
  Administered 2013-04-03 – 2013-04-04 (×2): 1 via ORAL
  Filled 2013-04-03 (×2): qty 1

## 2013-04-03 MED ORDER — LANOLIN HYDROUS EX OINT: TOPICAL_OINTMENT | CUTANEOUS | Status: DC | PRN

## 2013-04-03 MED ORDER — DIBUCAINE 1 % RE OINT: 1.0000 "application " | TOPICAL_OINTMENT | RECTAL | Status: DC | PRN

## 2013-04-03 MED ORDER — ZOLPIDEM TARTRATE 5 MG PO TABS: 5.0000 mg | ORAL_TABLET | Freq: Every evening | ORAL | Status: DC | PRN

## 2013-04-03 MED ORDER — SIMETHICONE 80 MG PO CHEW: 80.0000 mg | CHEWABLE_TABLET | ORAL | Status: DC | PRN

## 2013-04-03 MED ORDER — IBUPROFEN 600 MG PO TABS
600.0000 mg | ORAL_TABLET | Freq: Four times a day (QID) | ORAL | Status: DC
  Administered 2013-04-03 – 2013-04-04 (×7): 600 mg via ORAL
  Filled 2013-04-03 (×7): qty 1

## 2013-04-03 MED ORDER — FERROUS SULFATE 325 (65 FE) MG PO TABS
325.0000 mg | ORAL_TABLET | Freq: Two times a day (BID) | ORAL | Status: DC
  Administered 2013-04-03 – 2013-04-04 (×4): 325 mg via ORAL
  Filled 2013-04-03 (×4): qty 1

## 2013-04-03 MED ORDER — DIPHENHYDRAMINE HCL 25 MG PO CAPS: 25.0000 mg | ORAL_CAPSULE | Freq: Four times a day (QID) | ORAL | Status: DC | PRN

## 2013-04-03 MED ORDER — BENZOCAINE-MENTHOL 20-0.5 % EX AERO
1.0000 "application " | INHALATION_SPRAY | CUTANEOUS | Status: DC | PRN
  Administered 2013-04-03: 1 via TOPICAL
  Filled 2013-04-03: qty 56

## 2013-04-03 MED ORDER — WITCH HAZEL-GLYCERIN EX PADS: 1.0000 "application " | MEDICATED_PAD | CUTANEOUS | Status: DC | PRN

## 2013-04-03 MED ORDER — MEASLES, MUMPS & RUBELLA VAC ~~LOC~~ INJ
0.5000 mL | INJECTION | Freq: Once | SUBCUTANEOUS | Status: DC
  Filled 2013-04-03: qty 0.5

## 2013-04-03 MED ORDER — OXYCODONE-ACETAMINOPHEN 5-325 MG PO TABS
1.0000 | ORAL_TABLET | ORAL | Status: DC | PRN
  Administered 2013-04-03 – 2013-04-04 (×3): 1 via ORAL
  Filled 2013-04-03 (×3): qty 1

## 2013-04-03 MED ORDER — ONDANSETRON HCL 4 MG/2ML IJ SOLN: 4.0000 mg | INTRAMUSCULAR | Status: DC | PRN

## 2013-04-03 MED ORDER — TETANUS-DIPHTH-ACELL PERTUSSIS 5-2.5-18.5 LF-MCG/0.5 IM SUSP
0.5000 mL | Freq: Once | INTRAMUSCULAR | Status: AC
  Administered 2013-04-03: 0.5 mL via INTRAMUSCULAR
  Filled 2013-04-03: qty 0.5

## 2013-04-03 NOTE — Progress Notes (Signed)
Post Partum Day 1. Subjective: no complaints, up ad lib, voiding, tolerating PO and + flatus  Objective: Blood pressure 95/69, pulse 80, temperature 97.6 F (36.4 C), temperature source Oral, resp. rate 20, height 5' (1.524 m), weight 97.523 kg (215 lb), last menstrual period 07/18/2012, SpO2 99.00%, unknown if currently breastfeeding.  Physical Exam:  General: alert, cooperative, appears stated age and no distress Lochia: appropriate Uterine Fundus: firm DVT Evaluation: No evidence of DVT seen on physical exam.   Recent Labs  04/02/13 0900 04/03/13 0515  HGB 11.0* 8.7*  HCT 34.8* 26.7*    Assessment/Plan: Plan for discharge tomorrow, Discharge home, Breastfeeding and Contraception minipill. Patient's chart said she received her first round of prenatal care in [place]; it was actually in [place]. She has not been to [place] since childhood.  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 There are labs out to confirm her Hep B status. Baby received immunoglobulin after birth. Patient does not have a pediatrician and wants a list prior to discharge.   LOS: 1 day   Bing Plume 04/03/2013, 7:31 AM   I have seen and examined this patient and agree with above documentation in the PA student's note. Pt's Hgb dropped to 8.7 and was started on iron this AM also.  Agree with all of above as well.   Rulon Abide, M.D. [facility] Fellow 04/03/2013 7:53 AM

## 2013-04-03 NOTE — Progress Notes (Signed)
Ur chart review completed.  

## 2013-04-03 NOTE — Addendum Note (Signed)
Addendum created 04/03/13 0940 by Dana Allan, MD   Modules edited: Anesthesia Medication Administration

## 2013-04-03 NOTE — Lactation Note (Signed)
This note was copied from the chart of Morgan Nour Hago. Lactation Consultation Note First Lactation attempt mom speaking on phone and asked lactation to come back.  On return visit baby was sleeping in crib, Hand expression reviewed with colostrum given to baby on gloved finger.  Baby awakened giving feeding cues.  Assistance needed to latch baby in football hold on right breast.  Mom reported pain of 6, multiple attempts at repositioning pain decreased to a 5. Baby has burst of rhythmic suckling, a few swallows were heard.  Baby's suck was disorganized and dysfunctional at time. #20 NS attempted, baby refused latch. Mom desires supplementation to be given with bottle. Plan will be to pump every 3 hours and supplement colostrum with formula to total with feedings. Slow flow nipples provided.   Patient Name: Morgan [name] [id number] Date: 04/03/2013 Reason for consult: Follow-up assessment;Breast/nipple pain;Late preterm infant   Maternal Data    Feeding Feeding Type: Breast Fed Length of feed: 25 min  LATCH Score/Interventions Latch: Grasps breast easily, tongue down, lips flanged, rhythmical sucking.  Audible Swallowing: A few with stimulation Intervention(s): Skin to skin;Hand expression Intervention(s): Skin to skin;Hand expression;Alternate breast massage  Type of Nipple: Everted at rest and after stimulation  Comfort (Breast/Nipple): Filling, red/small blisters or bruises, mild/mod discomfort  Problem noted: Mild/Moderate discomfort Interventions (Mild/moderate discomfort): Hand massage;Hand expression  Hold (Positioning): Assistance needed to correctly position infant at breast and maintain latch. Intervention(s): Breastfeeding basics reviewed;Support Pillows;Position options;Skin to skin  LATCH Score: 7  Lactation Tools Discussed/Used Tools: Nipple Shields Nipple shield size: 20 Breast pump type: Double-Electric Breast Pump   Consult Status Consult Status:  Follow-up Date: 04/04/13 Follow-up type: In-patient    Morgan [name] 04/03/2013, 5:16 PM

## 2013-04-03 NOTE — Anesthesia Postprocedure Evaluation (Signed)
  Anesthesia Post-op Note  Patient: Morgan [name]  Procedure(s) Performed: * No procedures listed *  Patient Location: Mother/Baby  Anesthesia Type:Epidural  Level of Consciousness: awake  Airway and Oxygen Therapy: Patient Spontanous Breathing  Post-op Pain: none  Post-op Assessment: Patient's Cardiovascular Status Stable, Respiratory Function Stable, Patent Airway, No signs of Nausea or vomiting, Adequate PO intake, Pain level controlled, No headache, No backache, No residual numbness and No residual motor weakness  Post-op Vital Signs: Reviewed and stable  Complications: No apparent anesthesia complications

## 2013-04-04 LAB — HEPATITIS B E ANTIGEN: Hep B E Ag: NEGATIVE

## 2013-04-04 MED ORDER — FERROUS SULFATE 325 (65 FE) MG PO TABS: 325.0000 mg | ORAL_TABLET | Freq: Two times a day (BID) | ORAL | Status: DC

## 2013-04-04 MED ORDER — NORETHINDRONE 0.35 MG PO TABS: 1.0000 | ORAL_TABLET | Freq: Every day | ORAL | Status: DC

## 2013-04-04 MED ORDER — IBUPROFEN 600 MG PO TABS: 600.0000 mg | ORAL_TABLET | Freq: Four times a day (QID) | ORAL | Status: DC

## 2013-04-04 NOTE — Discharge Summary (Signed)
Attestation of Attending Supervision of Advanced Practitioner (CNM/NP): Evaluation and management procedures were performed by the Advanced Practitioner under my supervision and collaboration.  I have reviewed the Advanced Practitioner's note and chart, and I agree with the management and plan.  HARRAWAY-SMITH, Gracynn Rajewski 8:54 AM     

## 2013-04-04 NOTE — Lactation Note (Signed)
This note was copied from the chart of Morgan Anne-Marie Hago. Lactation Consultation Note Mom called to request assistance with feeding.  With mom's permission, demonstrated hand expression to mom; after about a minute, drops of colostrum formed.  Assisted mom to position baby in football on the right. Baby has deep latch with rhythmic sucking and audible swallowing (using stethoscope). Mom reassured. Mom does wince in discomfort with latch. Comfort gels provided with instructions for use.    Patient Name: Morgan [name] [id number] Date: 04/04/2013 Reason for consult: Follow-up assessment;Late preterm infant;Infant < 6lbs   Maternal Data Has patient been taught Hand Expression?: Yes  Feeding Feeding Type: Breast Fed Length of feed: 5 min  LATCH Score/Interventions Latch: Grasps breast easily, tongue down, lips flanged, rhythmical sucking.  Audible Swallowing: Spontaneous and intermittent Intervention(s): Hand expression;Skin to skin  Type of Nipple: Everted at rest and after stimulation  Comfort (Breast/Nipple): Filling, red/small blisters or bruises, mild/mod discomfort  Problem noted: Mild/Moderate discomfort Interventions (Mild/moderate discomfort): Comfort gels  Hold (Positioning): Assistance needed to correctly position infant at breast and maintain latch. Intervention(s): Breastfeeding basics reviewed;Support Pillows;Position options;Skin to skin  LATCH Score: 8  Lactation Tools Discussed/Used     Consult Status Consult Status: Follow-up    Octavio Manns [facility] 04/04/2013, 11:24 AM

## 2013-04-04 NOTE — Progress Notes (Signed)
AVS done with patient and sitz bath given with instructions. For 2nd degree lacaration for peri care.

## 2013-04-04 NOTE — Lactation Note (Signed)
This note was copied from the chart of Morgan Khristine Hago. Lactation Consultation Note Mom states she has "no milk", and is supplementing with formula. Mom states baby has a good latch, but she does not hear swallows. Mom states that she does not see milk in the pump, states pump is painful, and hand expression is painful.  Offered to assist with a feeding. Mom states baby had formula about an hour ago.  Instructed mom to attempt breast feeding before offering a bottle of formula. Instructed mom that it can be difficult to hear swallows this early (36 hours of life), and pumping at this point usually does not yield breast milk. Inst mom that I can return with next feeding to attempt hand expression, and to listen with stethoscope for audible swallows while baby is latched. Direct number provided for mom to call for baby's next feeding.    Patient Name: Morgan [name] [id number] Date: 04/04/2013 Reason for consult: Follow-up assessment   Maternal Data    Feeding    LATCH Score/Interventions                      Lactation Tools Discussed/Used     Consult Status Consult Status: PRN    Lenard Forth 04/04/2013, 10:01 AM

## 2013-04-04 NOTE — Discharge Summary (Signed)
Obstetric Discharge Summary Reason for Admission: onset of labor at 36w5 Prenatal Procedures: none Intrapartum Procedures: spontaneous vaginal delivery Postpartum Procedures: none Complications-Operative and Postpartum: none and 2nd degree perineal laceration Hemoglobin  Date Value Range Status  04/03/2013 8.7* 12.0 - 15.0 g/dL Final     REPEATED TO VERIFY     DELTA CHECK NOTED     HCT  Date Value Range Status  04/03/2013 26.7* 36.0 - 46.0 % Final    Physical Exam:  General: alert, cooperative and no distress Lochia: appropriate Uterine Fundus: firm Incision: healing well DVT Evaluation: No evidence of DVT seen on physical exam.  Discharge Diagnoses: Preterm labor- delivered  Discharge Information: Date: 04/04/2013 Activity: unrestricted Diet: routine Medications: Ibuprofen Condition: stable Instructions: refer to practice specific booklet Discharge to: home   Newborn Data: Live born female  Birth Weight: 6 lb 2.4 oz (2790 g) APGAR: 8, 9  Home with mother.  [name] is a 27 y.o G1P0101 who presented at 36w5 in active labor. Pt progressed normally, was noted to be febrile with chorio, placed on amp and gent for coverage. A viable M was delivered via NSVD. Complications of delivery include 2nd degree perineal lac which was repaired with 3.0 vicryl. EBL 400. Pt was hemodynamically stable. There was concern for hepB, given pt prev hx. Labs were sent. Hep B E Ag was negative. Other labs pending include core antibody, surface antigen and e antibody. Pt did well postpartum she will continue on iron on d/c as hgb dropped from 11-> 8.7. Plan is for breast feeding and micronor for contraception. Pt will follow up at [facility] in 4-6 days post-partum.   Morgan [name] 04/04/2013, 7:42 AM  I have seen this patient and agree with the above resident's note.  LEFTWICH-KIRBY, Morgan [name] Certified Nurse-Midwife

## 2013-04-05 LAB — HEPATITIS B SURF AG CONFIRMATION: Hepatitis B Surf Ag Confirmation: POSITIVE — AB

## 2013-04-05 LAB — HEPATITIS B CORE ANTIBODY, IGM: Hep B C IgM: NONREACTIVE

## 2013-04-05 LAB — HEPATITIS B SURFACE ANTIGEN

## 2013-04-06 ENCOUNTER — Ambulatory Visit: Payer: Self-pay

## 2013-04-06 NOTE — Lactation Note (Signed)
This note was copied from the chart of Morgan [name]. Lactation Consultation Note   Follow up consult with this mom of a now 37 2/7 week correced gestation baby, weighing just under 6 pounds today. Mom's mlk is transitioning in, andshe is somewhat engorged, and breast are very tender. Mom feels like she has a low grade fever. Her nipples are healing - mom using sore nipple shells and lanolin. Still sore, tender.I assisted mom with football hold and latching baby. He latche deeply with strong suckles and swallows. I will assist mom with pumping, and breast care after feeding is done.Mom to make an appointment with [facility], and I will loan her a DEP. I will also mke a follow up lacttion consult for mom and baby.  Patient Name: Morgan Peni Rupard ZOXWR'U Date: 04/06/2013 Reason for consult: Follow-up assessment;Infant < 6lbs   Maternal Data    Feeding Feeding Type: Breast Fed Length of feed: 10 min  LATCH Score/Interventions Latch: Grasps breast easily, tongue down, lips flanged, rhythmical sucking.  Audible Swallowing: Spontaneous and intermittent Intervention(s): Skin to skin;Hand expression  Type of Nipple: Everted at rest and after stimulation  Comfort (Breast/Nipple): Engorged, cracked, bleeding, large blisters, severe discomfort Problem noted: Engorgment;Cracked, bleeding, blisters, bruises Intervention(s): Ice Intervention(s): Expressed breast milk to nipple;Double electric pump  Problem noted: Cracked, bleeding, blisters, bruises;Filling;Mild/Moderate discomfort Interventions (Mild/moderate discomfort): Pre-pump if needed  Hold (Positioning): Assistance needed to correctly position infant at breast and maintain latch. Intervention(s): Breastfeeding basics reviewed;Support Pillows;Position options;Skin to skin  LATCH Score: 7  Lactation Tools Discussed/Used WIC Program: No (mom is going to call to apply and then get a DEP wic loaner) Pump Review: Setup, frequency, and cleaning;Milk  Storage;Other (comment) (engorgement care, hand expression)   Consult Status Consult Status: Follow-up Follow-up type: Out-patient    Alfred Levins 04/06/2013, 9:56 AM

## 2013-04-06 NOTE — Lactation Note (Signed)
This note was copied from the chart of Morgan Allye Hago. Lactation Consultation Note      Follow up consult with this mom and baby, immediatly post frenotomy done by Dr Ronalee Red. The baby latches easily, and mom reports that she is more comfortable, but still feels some pinching. With suck training, I  Could feel the baby's tongue under my finger some of the time, but he was still humping his tongue in the back. I showed mom how to gently push down on his tongue with her finger during suck training, and to gently stimulate his gums to increase tongue mobility. Mom also loaned a DEP, and knows to pump 4-6 times a day, to supplement the baby, and protect her milk supply. Mom was very full this morning, but after breast feeding and pumping, she is softer. Mom expressed 2 mls, which I fed to the baby with a nipple.  I also gave mom ice backs to apply to her breast, which also helped with some engorgement. Dr. Maggie Font is writing a prescription for mom for All purpose nipple cream, and I told mom to bring this to [facility] in [facility].  Mom will call to set up an outpatient lactation consult, at her convenience.  Patient Name: Morgan [name] [id number] Date: 04/06/2013 Reason for consult: Follow-up assessment   Maternal Data    Feeding Feeding Type: Breast Fed Length of feed: 40 min  LATCH Score/Interventions Latch: Grasps breast easily, tongue down, lips flanged, rhythmical sucking.  Audible Swallowing: Spontaneous and intermittent Intervention(s): Skin to skin;Hand expression  Type of Nipple: Everted at rest and after stimulation  Comfort (Breast/Nipple): Filling, red/small blisters or bruises, mild/mod discomfort Problem noted: Cracked, bleeding, blisters, bruises (mom has rx for all purpose nipple cream) Intervention(s): Ice Intervention(s): Expressed breast milk to nipple  Problem noted: Mild/Moderate discomfort (mom reports decreased pain after frenotomy done) Interventions  (Filling): Double electric pump Interventions (Severe discomfort): Observe pumping  Hold (Positioning): No assistance needed to correctly position infant at breast. Intervention(s): Breastfeeding basics reviewed;Support Pillows;Position options;Skin to skin  LATCH Score: 9  Lactation Tools Discussed/Used Tools: Flanges (increased mom to size 30 flanges) Flange Size: 30 WIC Program: Yes (mom hs appointment 10/27 to apply/DEP) Pump Review: Setup, frequency, and cleaning   Consult Status Consult Status: Complete Follow-up type: Call as needed (mom to come in for o/p lactation once she gets settled at home)    Morgan [name] 04/06/2013, 2:31 PM

## 2013-04-06 NOTE — Lactation Note (Signed)
This note was copied from the chart of Morgan Nayelis Hago. Lactation Consultation Note    Follow up consult. Baby fe well for 40 minutes ifn football hold on left breast. Baby then latched to right breast in football hold. Audible swallows heard. Initial latch extremel;y painful for mom. Baby has a short, thin frenulum noted toward the front of his tongue. Dr. Ezequiel Essex to be made aware  Patient Name: Morgan [name] [id number] Date: 04/06/2013 Reason for consult: Follow-up assessment   Maternal Data    Feeding Feeding Type: Breast Fed Length of feed: 40 min  LATCH Score/Interventions Latch: Grasps breast easily, tongue down, lips flanged, rhythmical sucking.  Audible Swallowing: Spontaneous and intermittent Intervention(s): Skin to skin;Hand expression Intervention(s): Skin to skin;Hand expression  Type of Nipple: Everted at rest and after stimulation  Comfort (Breast/Nipple): Filling, red/small blisters or bruises, mild/mod discomfort Problem noted: Cracked, bleeding, blisters, bruises Intervention(s): Ice;Hand expression Intervention(s): Expressed breast milk to nipple;Double electric pump  Problem noted: Severe discomfort Interventions  (Cracked/bleeding/bruising/blister): Lanolin Interventions (Mild/moderate discomfort): Pre-pump if needed  Hold (Positioning): No assistance needed to correctly position infant at breast. Intervention(s): Breastfeeding basics reviewed;Support Pillows;Position options;Skin to skin  LATCH Score: 9  Lactation Tools Discussed/Used WIC Program: No (mom is going to call to apply and then get a DEP wic loaner) Pump Review: Setup, frequency, and cleaning;Milk Storage;Other (comment) (engorgement care, hand expression)   Consult Status Consult Status: Follow-up Follow-up type: Out-patient    Alfred Levins 04/06/2013, 10:31 AM

## 2013-06-01 ENCOUNTER — Encounter: Payer: Self-pay | Admitting: Obstetrics & Gynecology

## 2013-06-01 ENCOUNTER — Ambulatory Visit (INDEPENDENT_AMBULATORY_CARE_PROVIDER_SITE_OTHER): Payer: Medicaid Other | Admitting: Obstetrics & Gynecology

## 2013-06-01 VITALS — BP 111/81 | HR 65 | Temp 97.0°F | Ht 62.0 in | Wt 194.9 lb

## 2013-06-01 DIAGNOSIS — E229 Hyperfunction of pituitary gland, unspecified: Secondary | ICD-10-CM

## 2013-06-01 DIAGNOSIS — Z309 Encounter for contraceptive management, unspecified: Secondary | ICD-10-CM

## 2013-06-01 DIAGNOSIS — E221 Hyperprolactinemia: Secondary | ICD-10-CM | POA: Insufficient documentation

## 2013-06-01 DIAGNOSIS — D649 Anemia, unspecified: Secondary | ICD-10-CM

## 2013-06-01 LAB — CBC
HCT: 35.5 % — ABNORMAL LOW (ref 36.0–46.0)
MCHC: 31.5 g/dL (ref 30.0–36.0)
Platelets: 276 10*3/uL (ref 150–400)
RDW: 15.3 % (ref 11.5–15.5)
WBC: 5.4 10*3/uL (ref 4.0–10.5)

## 2013-06-01 LAB — TSH: TSH: 1.538 u[IU]/mL (ref 0.350–4.500)

## 2013-06-01 MED ORDER — NORETHINDRONE 0.35 MG PO TABS: 1.0000 | ORAL_TABLET | Freq: Every day | ORAL | Status: DC

## 2013-06-01 NOTE — Progress Notes (Signed)
Patient ID: Morgan [name], female   DOB: 1986-05-25, 27 y.o.   MRN: [id number] Subjective:     Morgan [name] is a 27 y.o. female who presents for a postpartum visit. She is 8 weeks postpartum following a spontaneous vaginal delivery. I have fully reviewed the prenatal and intrapartum course. The delivery was at 36 5/7 gestational weeks. Outcome: spontaneous vaginal delivery. Anesthesia: epidural. Postpartum course has been uncomlicated. Baby's course has been unremarkable. Baby is feeding by both breast and bottle - unknown. Bleeding thin lochia. Bowel function is pt complains of constipation. Bladder function is normal. Patient is not sexually active. Contraception method is abstinence. Postpartum depression screening: negative.  The following portions of the patient's history were reviewed and updated as appropriate: allergies, current medications, past family history, past medical history, past social history, past surgical history and problem list.  Review of Systems A comprehensive review of systems was negative.   Objective:    BP 111/81  Pulse 65  Temp(Src) 97 F (36.1 C) (Oral)  Ht 5\' 2"  (1.575 m)  Wt 194 lb 14.4 oz (88.406 kg)  BMI 35.64 kg/m2  Breastfeeding? Yes              Abdomen: soft, non-tender; bowel sounds normal; no masses,  no organomegaly   Vulva:  perineal laceration still healing.  No s/sx of infection  Vagina: normal vagina  Cervix:  no cervical motion tenderness  Corpus: normal size, contour, position, consistency, mobility, non-tender  Adnexa:  no mass, fullness, tenderness           Assessment:     8 weeks postpartum exam. Pap smear not done at today's visit.  H/o prolactinemia- prev treated Plan:    1. Contraception: POP's 2. Labs: CBC, TSH  And prolactin 3. Follow up in: 8 months or as needed.

## 2013-06-01 NOTE — Patient Instructions (Signed)
Norethindrone tablets (contraception) What is this medicine? NORETHINDRONE (nor eth IN drone) is an oral contraceptive. The product contains a female hormone known as a progestin. It is used to prevent pregnancy. This medicine may be used for other purposes; ask your health care provider or pharmacist if you have questions. COMMON BRAND NAME(S): Camila, Errin , Heather, Jencycla, Jolivette , Lyza, Nor-QD, Nora-BE, Ortho Micronor What should I tell my health care provider before I take this medicine? They need to know if you have any of these conditions: -blood vessel disease or blood clots -breast, cervical, or vaginal cancer -diabetes -heart disease -kidney disease -liver disease -mental depression -migraine -seizures -stroke -vaginal bleeding -an unusual or allergic reaction to norethindrone, other medicines, foods, dyes, or preservatives -pregnant or trying to get pregnant -breast-feeding How should I use this medicine? Take this medicine by mouth with a glass of water. You may take it with or without food. Follow the directions on the prescription label. Take this medicine at the same time each day and in the order directed on the package. Do not take your medicine more often than directed. Contact your pediatrician regarding the use of this medicine in children. Special care may be needed. This medicine has been used in female children who have started having menstrual periods. A patient package insert for the product will be given with each prescription and refill. Read this sheet carefully each time. The sheet may change frequently. Overdosage: If you think you have taken too much of this medicine contact a poison control center or emergency room at once. NOTE: This medicine is only for you. Do not share this medicine with others. What if I miss a dose? Try not to miss a dose. Every time you miss a dose or take a dose late your chance of pregnancy increases. When 1 pill is missed  (even if only 3 hours late), take the missed pill as soon as possible and continue taking a pill each day at the regular time (use a back up method of birth control for the next 48 hours). If more than 1 dose is missed, use an additional birth control method for the rest of your pill pack until menses occurs. Contact your health care professional if more than 1 dose has been missed. What may interact with this medicine? Do not take this medicine with any of the following medications: -amprenavir or fosamprenavir -bosentan This medicine may also interact with the following medications: -antibiotics or medicines for infections, especially rifampin, rifabutin, rifapentine, and griseofulvin, and possibly penicillins or tetracyclines -aprepitant -barbiturate medicines, such as phenobarbital -carbamazepine -felbamate -modafinil -oxcarbazepine -phenytoin -ritonavir or other medicines for HIV infection or AIDS -St. John's wort -topiramate This list may not describe all possible interactions. Give your health care provider a list of all the medicines, herbs, non-prescription drugs, or dietary supplements you use. Also tell them if you smoke, drink alcohol, or use illegal drugs. Some items may interact with your medicine. What should I watch for while using this medicine? Visit your doctor or health care professional for regular checks on your progress. You will need a regular breast and pelvic exam and Pap smear while on this medicine. Use an additional method of birth control during the first cycle that you take these tablets. If you have any reason to think you are pregnant, stop taking this medicine right away and contact your doctor or health care professional. If you are taking this medicine for hormone related problems, it may take several   cycles of use to see improvement in your condition. This medicine does not protect you against HIV infection (AIDS) or any other sexually transmitted  diseases. What side effects may I notice from receiving this medicine? Side effects that you should report to your doctor or health care professional as soon as possible: -breast tenderness or discharge -pain in the abdomen, chest, groin or leg -severe headache -skin rash, itching, or hives -sudden shortness of breath -unusually weak or tired -vision or speech problems -yellowing of skin or eyes Side effects that usually do not require medical attention (report to your doctor or health care professional if they continue or are bothersome): -changes in sexual desire -change in menstrual flow -facial hair growth -fluid retention and swelling -headache -irritability -nausea -weight gain or loss This list may not describe all possible side effects. Call your doctor for medical advice about side effects. You may report side effects to FDA at 1-800-FDA-1088. Where should I keep my medicine? Keep out of the reach of children. Store at room temperature between 15 and 30 degrees C (59 and 86 degrees F). Throw away any unused medicine after the expiration date. NOTE: This sheet is a summary. It may not cover all possible information. If you have questions about this medicine, talk to your doctor, pharmacist, or health care provider.  2014, Elsevier/Gold Standard. (2012-02-26 16:41:35)  

## 2013-06-02 LAB — PROLACTIN: Prolactin: 5.4 ng/mL

## 2013-06-22 NOTE — L&D Delivery Note (Addendum)
Delivery Note I was called to attend this delivery d/t rapid progression.  After one contraction (that she pushed through), at 9:06 PM a viable female was delivered vaginally, Spontaneous Delivery (Presentation: ROA  ).  APGAR: 8/9 ; weight pending. At this point, Dr. Gaynell FaceMarshall walked in and assumed care.    Placenta status: , .  Cord:  with the following complications: .  Cord pH: not done  Anesthesia:  epidural Episiotomy: none Lacerations:  Suture Repair: 2.0 Est. Blood Loss (mL):   Mom to postpartum.  Baby to Couplet care / Skin to Skin.  MARSHALL,BERNARD A 04/12/2014, 9:16 PM

## 2013-12-11 ENCOUNTER — Encounter: Payer: Self-pay | Admitting: Obstetrics & Gynecology

## 2013-12-11 ENCOUNTER — Encounter: Payer: Medicaid Other | Admitting: Obstetrics & Gynecology

## 2013-12-11 ENCOUNTER — Ambulatory Visit (INDEPENDENT_AMBULATORY_CARE_PROVIDER_SITE_OTHER): Payer: Medicaid Other | Admitting: Obstetrics & Gynecology

## 2013-12-11 VITALS — BP 114/77 | HR 90 | Temp 98.6°F | Wt 210.0 lb

## 2013-12-11 DIAGNOSIS — Z3482 Encounter for supervision of other normal pregnancy, second trimester: Secondary | ICD-10-CM

## 2013-12-11 DIAGNOSIS — Z8619 Personal history of other infectious and parasitic diseases: Secondary | ICD-10-CM | POA: Insufficient documentation

## 2013-12-11 DIAGNOSIS — O09899 Supervision of other high risk pregnancies, unspecified trimester: Secondary | ICD-10-CM | POA: Insufficient documentation

## 2013-12-11 DIAGNOSIS — Z3480 Encounter for supervision of other normal pregnancy, unspecified trimester: Secondary | ICD-10-CM

## 2013-12-11 DIAGNOSIS — E669 Obesity, unspecified: Secondary | ICD-10-CM | POA: Insufficient documentation

## 2013-12-11 DIAGNOSIS — B181 Chronic viral hepatitis B without delta-agent: Secondary | ICD-10-CM

## 2013-12-11 LAB — HEMOGLOBIN A1C
HEMOGLOBIN A1C: 5.5 % (ref <5.7)
Mean Plasma Glucose: 111 mg/dL (ref <117)

## 2013-12-11 LAB — HEPATIC FUNCTION PANEL
ALT: <8 U/L (ref 0–35)
AST: 10 U/L (ref 0–37)
Albumin: 3.3 g/dL — ABNORMAL LOW (ref 3.5–5.2)
Alkaline Phosphatase: 58 U/L (ref 39–117)
BILIRUBIN DIRECT: 0.1 mg/dL (ref 0.0–0.3)
BILIRUBIN TOTAL: 0.3 mg/dL (ref 0.2–1.2)
Indirect Bilirubin: 0.2 mg/dL (ref 0.2–1.2)
Total Protein: 5.9 g/dL — ABNORMAL LOW (ref 6.0–8.3)

## 2013-12-11 LAB — OB RESULTS CONSOLE GC/CHLAMYDIA
Chlamydia: NEGATIVE
Gonorrhea: NEGATIVE

## 2013-12-12 LAB — VARICELLA ZOSTER ANTIBODY, IGG: Varicella IgG: 1023 Index — ABNORMAL HIGH (ref <135.00)

## 2013-12-12 LAB — OBSTETRIC PANEL
ANTIBODY SCREEN: NEGATIVE
Basophils Absolute: 0 10*3/uL (ref 0.0–0.1)
Basophils Relative: 0 % (ref 0–1)
EOS ABS: 0.1 10*3/uL (ref 0.0–0.7)
EOS PCT: 2 % (ref 0–5)
HEMATOCRIT: 31.9 % — AB (ref 36.0–46.0)
Hemoglobin: 10 g/dL — ABNORMAL LOW (ref 12.0–15.0)
Hepatitis B Surface Ag: POSITIVE — AB
Lymphocytes Relative: 37 % (ref 12–46)
Lymphs Abs: 1.6 10*3/uL (ref 0.7–4.0)
MCH: 19.6 pg — AB (ref 26.0–34.0)
MCHC: 31.3 g/dL (ref 30.0–36.0)
MCV: 62.5 fL — ABNORMAL LOW (ref 78.0–100.0)
MONO ABS: 0.3 10*3/uL (ref 0.1–1.0)
Monocytes Relative: 7 % (ref 3–12)
Neutro Abs: 2.4 10*3/uL (ref 1.7–7.7)
Neutrophils Relative %: 54 % (ref 43–77)
Platelets: 169 10*3/uL (ref 150–400)
RBC: 5.1 MIL/uL (ref 3.87–5.11)
RDW: 16.6 % — ABNORMAL HIGH (ref 11.5–15.5)
RUBELLA: 3.02 {index} — AB (ref <0.90)
Rh Type: POSITIVE
WBC: 4.4 10*3/uL (ref 4.0–10.5)

## 2013-12-12 LAB — VITAMIN D 25 HYDROXY (VIT D DEFICIENCY, FRACTURES): VIT D 25 HYDROXY: 17 ng/mL — AB (ref 30–89)

## 2013-12-12 LAB — HIV ANTIBODY (ROUTINE TESTING W REFLEX): HIV: NONREACTIVE

## 2013-12-12 LAB — GC/CHLAMYDIA PROBE AMP
CT PROBE, AMP APTIMA: NEGATIVE
GC Probe RNA: NEGATIVE

## 2013-12-12 NOTE — Patient Instructions (Signed)
Glucose Tolerance Test This is a test to see how your body processes carbohydrates. This test is often done to check patients for diabetes or the possibility of developing it. PREPARATION FOR TEST You should have nothing to eat or drink 12 hours before the test. You will be given a form of sugar (glucose) and then blood samples will be drawn from your vein to determine the level of sugar in your blood. Alternatively, blood may be drawn from your finger for testing. You should not smoke or exercise during the test. NORMAL FINDINGS  Fasting: 70-115 mg/dL  30 minutes: less than 200 mg/dL  1 hour: less than 200 mg/dL  2 hours: less than 140 mg/dL  3 hours: 70-115 mg/dL  4 hours: 70-115 mg/dL Ranges for normal findings may vary among different laboratories and hospitals. You should always check with your doctor after having lab work or other tests done to discuss the meaning of your test results and whether your values are considered within normal limits. MEANING OF TEST Your caregiver will go over the test results with you and discuss the importance and meaning of your results, as well as treatment options and the need for additional tests. OBTAINING THE TEST RESULTS It is your responsibility to obtain your test results. Ask the lab or department performing the test when and how you will get your results. Document Released: 07/01/2004 Document Revised: 08/31/2011 Document Reviewed: 05/19/2008 ExitCare Patient Information 2015 [employer]. This information is not intended to replace advice given to you by your health care provider. Make sure you discuss any questions you have with your health care provider.  

## 2013-12-12 NOTE — Progress Notes (Signed)
Subjective:    Morgan FootmanIreen M [name] is being seen today for her first obstetrical visit.  This is not a planned pregnancy. She is at 3431w6d gestation. Her obstetrical history is significant for obesity.  Patient was unable to breast feed her last infant. Pregnancy history fully reviewed.    Menstrual History: OB History   Grav Para Term Preterm Abortions TAB SAB Ect Mult Living   2 1  1      1        Patient's last menstrual period was 07/18/2013.    Past Medical History  Diagnosis Date  . Complication of anesthesia     Patient states when given the epidural the numbness went up to her nose.   Marland Kitchen. Hepatitis B carrier     History reviewed. No pertinent past surgical history.   (Not in a hospital admission) No Known Allergies  History  Substance Use Topics  . Smoking status: Never Smoker   . Smokeless tobacco: Never Used  . Alcohol Use: No    Family History  Problem Relation Age of Onset  . Alcohol abuse Neg Hx   . Arthritis Neg Hx   . Asthma Neg Hx   . Birth defects Neg Hx   . Cancer Neg Hx   . COPD Neg Hx   . Depression Neg Hx   . Diabetes Neg Hx   . Drug abuse Neg Hx   . Early death Neg Hx   . Hearing loss Neg Hx   . Heart disease Neg Hx   . Hyperlipidemia Neg Hx   . Hypertension Neg Hx   . Kidney disease Neg Hx   . Learning disabilities Neg Hx   . Mental illness Neg Hx   . Mental retardation Neg Hx   . Miscarriages / Stillbirths Neg Hx   . Stroke Neg Hx   . Vision loss Neg Hx      Review of Systems Constitutional: negative for weight loss Gastrointestinal: negative for vomiting Genitourinary:negative for genital lesions and vaginal discharge and dysuria Musculoskeletal:negative for back pain Behavioral/Psych: negative for abusive relationship, depression, illegal drug usage and tobacco use    Objective:     General Appearance:    Alert, cooperative, no distress, appears stated age  Head:    Normocephalic, without obvious abnormality, atraumatic  Eyes:     PERRL, conjunctiva/corneas clear, EOM's intact, fundi    benign, both eyes  Ears:    Normal TM's and external ear canals, both ears  Nose:   Nares normal, septum midline, mucosa normal, no drainage    or sinus tenderness  Throat:   Lips, mucosa, and tongue normal; teeth and gums normal  Neck:   Supple, symmetrical, trachea midline, no adenopathy;    thyroid:  no enlargement/tenderness/nodules; no carotid   bruit or JVD  Back:     Symmetric, no curvature, ROM normal, no CVA tenderness  Lungs:     Clear to auscultation bilaterally, respirations unlabored  Chest Wall:    No tenderness or deformity   Heart:    Regular rate and rhythm, S1 and S2 normal, no murmur, rub   or gallop  Breast Exam:    No tenderness, masses, or nipple abnormality  Abdomen:     Soft, non-tender, bowel sounds active all four quadrants,    no masses  Genitalia:    Normal female without lesion, discharge or tenderness  Extremities:   Extremities normal, atraumatic, no cyanosis or edema  Pulses:   2+ and symmetric all  extremities  Skin:   Skin color, texture, turgor normal, no rashes or lesions  Lymph nodes:   Cervical, supraclavicular, and axillary nodes normal  Neurologic:   CNII-XII intact, normal strength, sensation and reflexes    throughout      Lab Review Urine pregnancy test Labs reviewed no Radiologic studies reviewed no Assessment:    Pregnancy at 5548w0d weeks    Plan:    Prenatal vitamins.  Counseling provided regarding continued use of seat belts, cessation of alcohol consumption, smoking or use of illicit drugs; infection precautions i.e., influenza/TDAP immunizations, toxoplasmosis,CMV, parvovirus, listeria and varicella; workplace safety, exercise during pregnancy; routine dental care, safe medications, sexual activity, hot tubs, saunas, pools, travel, caffeine use, fish and methlymercury, potential toxins, hair treatments, varicose veins Weight gain recommendations per IOM guidelines reviewed:  obese/BMI >30->gain  11 - 20 lbs Problem list reviewed and updated. Role of ultrasound in pregnancy discussed; fetal survey: ordered. Amniocentesis discussed: not indicated.  Meds ordered this encounter  Medications  . tobramycin (TOBREX) 0.3 % ophthalmic ointment    Sig: 1 application 3 (three) times daily.   Orders Placed This Encounter  Procedures  . Culture, OB Urine  . GC/Chlamydia Probe Amp  . Obstetric panel  . HIV antibody  . Hemoglobinopathy evaluation  . Varicella zoster antibody, IgG  . Vit D  25 hydroxy (rtn osteoporosis monitoring)  . Hepatic function panel  . Hemoglobin A1c  . Hepatitis B DNA, ultraquantitative, PCR    Follow up in 4 weeks.

## 2013-12-13 LAB — HEMOGLOBINOPATHY EVALUATION
HGB A2 QUANT: 2.1 % — AB (ref 2.2–3.2)
Hemoglobin Other: 0 %
Hgb A: 97.9 % — ABNORMAL HIGH (ref 96.8–97.8)
Hgb F Quant: 0 % (ref 0.0–2.0)
Hgb S Quant: 0 %

## 2013-12-13 LAB — CULTURE, OB URINE
Colony Count: NO GROWTH
Organism ID, Bacteria: NO GROWTH

## 2013-12-14 LAB — HEPATITIS B DNA, ULTRAQUANTITATIVE, PCR: Hepatitis B DNA: NOT DETECTED IU/mL (ref <20)

## 2013-12-19 ENCOUNTER — Telehealth: Payer: Self-pay | Admitting: *Deleted

## 2013-12-19 NOTE — Telephone Encounter (Signed)
Ms. Shane CrutchKoonz calling regarding lab results 6/22 on our patient. [facility] we are aware of patient hep B status as well as the patient is also. She has been pregnant before and this was positive on previous testing.

## 2013-12-26 ENCOUNTER — Telehealth: Payer: Self-pay | Admitting: *Deleted

## 2013-12-26 NOTE — Telephone Encounter (Signed)
Ms. Wilford GristKoontz contacted the office regarding Ms. Hago. Ms. Wilford GristKoontz stated she had received the message left by Erskine SquibbJane but also needed Ms. Hago's EDD. Ms. Wilford GristKoontz made aware of patient's EDD.

## 2014-01-03 ENCOUNTER — Other Ambulatory Visit: Payer: Medicaid Other

## 2014-01-03 ENCOUNTER — Ambulatory Visit (INDEPENDENT_AMBULATORY_CARE_PROVIDER_SITE_OTHER): Payer: Medicaid Other | Admitting: Obstetrics & Gynecology

## 2014-01-03 ENCOUNTER — Encounter: Payer: Self-pay | Admitting: Obstetrics & Gynecology

## 2014-01-03 VITALS — BP 102/67 | HR 83 | Temp 97.9°F | Wt 211.0 lb

## 2014-01-03 DIAGNOSIS — Z3480 Encounter for supervision of other normal pregnancy, unspecified trimester: Secondary | ICD-10-CM

## 2014-01-03 DIAGNOSIS — Z3482 Encounter for supervision of other normal pregnancy, second trimester: Secondary | ICD-10-CM

## 2014-01-03 LAB — CBC
HCT: 32.5 % — ABNORMAL LOW (ref 36.0–46.0)
Hemoglobin: 9.8 g/dL — ABNORMAL LOW (ref 12.0–15.0)
MCH: 19.3 pg — ABNORMAL LOW (ref 26.0–34.0)
MCHC: 30.2 g/dL (ref 30.0–36.0)
MCV: 64.1 fL — ABNORMAL LOW (ref 78.0–100.0)
Platelets: 173 10*3/uL (ref 150–400)
RBC: 5.07 MIL/uL (ref 3.87–5.11)
RDW: 16.9 % — ABNORMAL HIGH (ref 11.5–15.5)
WBC: 5.3 10*3/uL (ref 4.0–10.5)

## 2014-01-03 LAB — POCT URINALYSIS DIPSTICK
Bilirubin, UA: NEGATIVE
Glucose, UA: NEGATIVE
Ketones, UA: NEGATIVE
Leukocytes, UA: NEGATIVE
Nitrite, UA: NEGATIVE
PH UA: 5
PROTEIN UA: NEGATIVE
SPEC GRAV UA: 1.015
UROBILINOGEN UA: NEGATIVE

## 2014-01-04 ENCOUNTER — Encounter: Payer: Medicaid Other | Admitting: Obstetrics & Gynecology

## 2014-01-04 ENCOUNTER — Other Ambulatory Visit: Payer: Medicaid Other

## 2014-01-04 LAB — HIV ANTIBODY (ROUTINE TESTING W REFLEX): HIV 1&2 Ab, 4th Generation: NONREACTIVE

## 2014-01-04 LAB — RPR

## 2014-01-04 LAB — GLUCOSE TOLERANCE, 2 HOURS W/ 1HR
GLUCOSE, FASTING: 77 mg/dL (ref 70–99)
Glucose, 1 hour: 169 mg/dL (ref 70–170)
Glucose, 2 hour: 148 mg/dL — ABNORMAL HIGH (ref 70–139)

## 2014-01-05 ENCOUNTER — Encounter: Payer: Self-pay | Admitting: Obstetrics & Gynecology

## 2014-01-05 DIAGNOSIS — D509 Iron deficiency anemia, unspecified: Secondary | ICD-10-CM | POA: Insufficient documentation

## 2014-01-05 NOTE — Progress Notes (Signed)
Subjective:    Morgan [name] is a 28 y.o. female being seen today for her obstetrical visit. She is at 7323w1d gestation. Patient reports: no complaints . Fetal movement: normal.  Problem List Items Addressed This Visit   Supervision of other normal pregnancy - Primary   Relevant Orders      POCT urinalysis dipstick (Completed)      Glucose Tolerance, 2 Hours w/1 Hour (Completed)      CBC (Completed)      HIV antibody (Completed)      RPR (Completed)      US OB Comp + 14 Wk     Patient Active Problem List   Diagnosis Date Noted  . Hepatitis B carrier 12/11/2013  . Supervision of other normal pregnancy 12/11/2013  . Short interval between pregnancies affecting pregnancy, antepartum 12/11/2013  . Obesity (BMI 35.0-39.9 without comorbidity) 12/11/2013  . Hyperprolactinemia 06/01/2013   Objective:    BP 102/67  Pulse 83  Temp(Src) 97.9 F (36.6 C)  Wt 95.709 kg (211 lb)  LMP 07/18/2013 FHT: 160 BPM  Uterine Size: size equals dates     Assessment:    Pregnancy @ 478w3d    Plan:   Labs, problem list reviewed and updated 2 hr GTT today Follow up in 4 weeks.

## 2014-01-05 NOTE — Patient Instructions (Signed)
Tetanus, Diphtheria, Pertussis (Tdap) Vaccine  What You Need to Know  WHY GET VACCINATED?  Tetanus, diphtheria and pertussis can be very serious diseases, even for adolescents and adults. Tdap vaccine can protect us from these diseases.  TETANUS (Lockjaw) causes painful muscle tightening and stiffness, usually all over the body.  · It can lead to tightening of muscles in the head and neck so you can't open your mouth, swallow, or sometimes even breathe. Tetanus kills about 1 out of 5 people who are infected.  DIPHTHERIA can cause a thick coating to form in the back of the throat.  · It can lead to breathing problems, paralysis, heart failure, and death.  PERTUSSIS (Whooping Cough) causes severe coughing spells, which can cause difficulty breathing, vomiting and disturbed sleep.  · It can also lead to weight loss, incontinence, and rib fractures. Up to 2 in 100 adolescents and 5 in 100 adults with pertussis are hospitalized or have complications, which could include pneumonia and death.  These diseases are caused by bacteria. Diphtheria and pertussis are spread from person to person through coughing or sneezing. Tetanus enters the body through cuts, scratches, or wounds.  Before vaccines, the [place] saw as many as 200,000 cases a year of diphtheria and pertussis, and hundreds of cases of tetanus. Since vaccination began, tetanus and diphtheria have dropped by about 99% and pertussis by about 80%.  TDAP VACCINE  Tdap vaccine can protect adolescents and adults from tetanus, diphtheria, and pertussis. One dose of Tdap is routinely given at age 11 or 12. People who did not get Tdap at that age should get it as soon as possible.  Tdap is especially important for health care professionals and anyone having close contact with a baby younger than 12 months.  Pregnant women should get a dose of Tdap during every pregnancy, to protect the newborn from pertussis. Infants are most at risk for severe, life-threatening  complications from pertussis.  A similar vaccine, called Td, protects from tetanus and diphtheria, but not pertussis. A Td booster should be given every 10 years. Tdap may be given as one of these boosters if you have not already gotten a dose. Tdap may also be given after a severe cut or burn to prevent tetanus infection.  Your doctor can give you more information.  Tdap may safely be given at the same time as other vaccines.  SOME PEOPLE SHOULD NOT GET THIS VACCINE  · If you ever had a life-threatening allergic reaction after a dose of any tetanus, diphtheria, or pertussis containing vaccine, OR if you have a severe allergy to any part of this vaccine, you should not get Tdap. Tell your doctor if you have any severe allergies.  · If you had a coma, or long or multiple seizures within 7 days after a childhood dose of DTP or DTaP, you should not get Tdap, unless a cause other than the vaccine was found. You can still get Td.  · Talk to your doctor if you:  ¨ have epilepsy or another nervous system problem,  ¨ had severe pain or swelling after any vaccine containing diphtheria, tetanus or pertussis,  ¨ ever had Guillain-Barré Syndrome (GBS),  ¨ aren't feeling well on the day the shot is scheduled.  RISKS OF A VACCINE REACTION  With any medicine, including vaccines, there is a chance of side effects. These are usually mild and go away on their own, but serious reactions are also possible.  Brief fainting spells can follow   a vaccination, leading to injuries from falling. Sitting or lying down for about 15 minutes can help prevent these. Tell your doctor if you feel dizzy or light-headed, or have vision changes or ringing in the ears.  Mild problems following Tdap (Did not interfere with activities)  · Pain where the shot was given (about 3 in 4 adolescents or 2 in 3 adults)  · Redness or swelling where the shot was given (about 1 person in 5)  · Mild fever of at least 100.4°F (up to about 1 in 25 adolescents or 1 in  100 adults)  · Headache (about 3 or 4 people in 10)  · Tiredness (about 1 person in 3 or 4)  · Nausea, vomiting, diarrhea, stomach ache (up to 1 in 4 adolescents or 1 in 10 adults)  · Chills, body aches, sore joints, rash, swollen glands (uncommon)  Moderate problems following Tdap (Interfered with activities, but did not require medical attention)  · Pain where the shot was given (about 1 in 5 adolescents or 1 in 100 adults)  · Redness or swelling where the shot was given (up to about 1 in 16 adolescents or 1 in 25 adults)  · Fever over 102°F (about 1 in 100 adolescents or 1 in 250 adults)  · Headache (about 3 in 20 adolescents or 1 in 10 adults)  · Nausea, vomiting, diarrhea, stomach ache (up to 1 or 3 people in 100)  · Swelling of the entire arm where the shot was given (up to about 3 in 100).  Severe problems following Tdap (Unable to perform usual activities, required medical attention)  · Swelling, severe pain, bleeding and redness in the arm where the shot was given (rare).  A severe allergic reaction could occur after any vaccine (estimated less than 1 in a million doses).  WHAT IF THERE IS A SERIOUS REACTION?  What should I look for?  · Look for anything that concerns you, such as signs of a severe allergic reaction, very high fever, or behavior changes.  Signs of a severe allergic reaction can include hives, swelling of the face and throat, difficulty breathing, a fast heartbeat, dizziness, and weakness. These would start a few minutes to a few hours after the vaccination.  What should I do?  · If you think it is a severe allergic reaction or other emergency that can't wait, call 9-1-1 or get the person to the nearest hospital. Otherwise, call your doctor.  · Afterward, the reaction should be reported to the "Vaccine Adverse Event Reporting System" (VAERS). Your doctor might file this report, or you can do it yourself through the VAERS web site at www.vaers.hhs.gov, or by calling 1-800-822-7967.  VAERS is  only for reporting reactions. They do not give medical advice.   THE NATIONAL VACCINE INJURY COMPENSATION PROGRAM  The National Vaccine Injury Compensation Program (VICP) is a federal program that was created to compensate people who may have been injured by certain vaccines.  Persons who believe they may have been injured by a vaccine can learn about the program and about filing a claim by calling 1-800-338-2382 or visiting the VICP website at www.hrsa.gov/vaccinecompensation.  HOW CAN I LEARN MORE?  · Ask your doctor.  · Call your local or state health department.  · Contact the Centers for Disease Control and Prevention (CDC):  ¨ Call 1-800-232-4636 or visit CDC's website at www.cdc.gov/vaccines.  CDC Tdap Vaccine VIS (10/29/11)  Document Released: 12/08/2011 Document Revised: 10/03/2012 Document Reviewed: 09/28/2012  ExitCare®   Patient Information ©2015 [employer]. This information is not intended to replace advice given to you by your health care provider. Make sure you discuss any questions you have with your health care provider.

## 2014-01-11 ENCOUNTER — Inpatient Hospital Stay (HOSPITAL_COMMUNITY)
Admission: AD | Admit: 2014-01-11 | Payer: Medicaid Other | Source: Ambulatory Visit | Admitting: Obstetrics & Gynecology

## 2014-01-17 ENCOUNTER — Other Ambulatory Visit: Payer: Self-pay | Admitting: Obstetrics & Gynecology

## 2014-01-17 ENCOUNTER — Ambulatory Visit (HOSPITAL_COMMUNITY)
Admission: RE | Admit: 2014-01-17 | Discharge: 2014-01-17 | Disposition: A | Discharge status: Home / Self Care | Payer: Medicaid Other | Source: Ambulatory Visit | Attending: Obstetrics & Gynecology | Admitting: Obstetrics & Gynecology

## 2014-01-17 DIAGNOSIS — Z3482 Encounter for supervision of other normal pregnancy, second trimester: Secondary | ICD-10-CM

## 2014-01-17 DIAGNOSIS — Z3689 Encounter for other specified antenatal screening: Secondary | ICD-10-CM | POA: Insufficient documentation

## 2014-01-17 IMAGING — US US OB DETAIL+14 WK
1 series · 12 of 28 positions shown · non-contrast
Comparison: none

[Series 1: us ob comp +14 wk · 107 acquisitions, 12 frames shown]
[im 4/107]
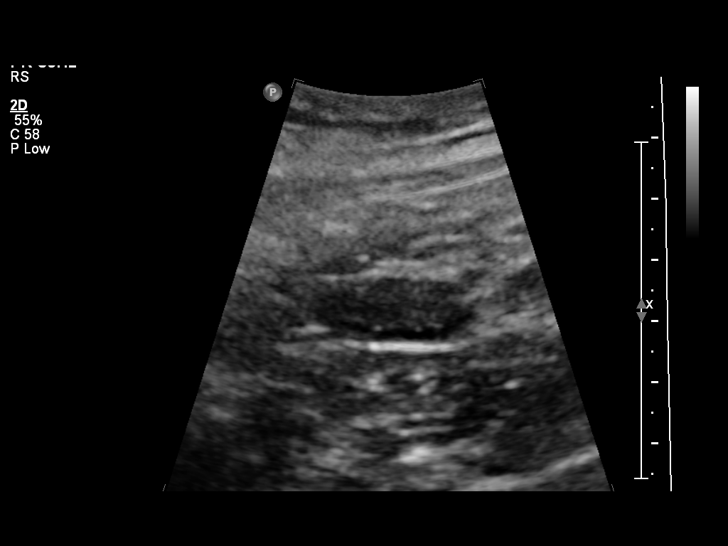
[im 12/107]
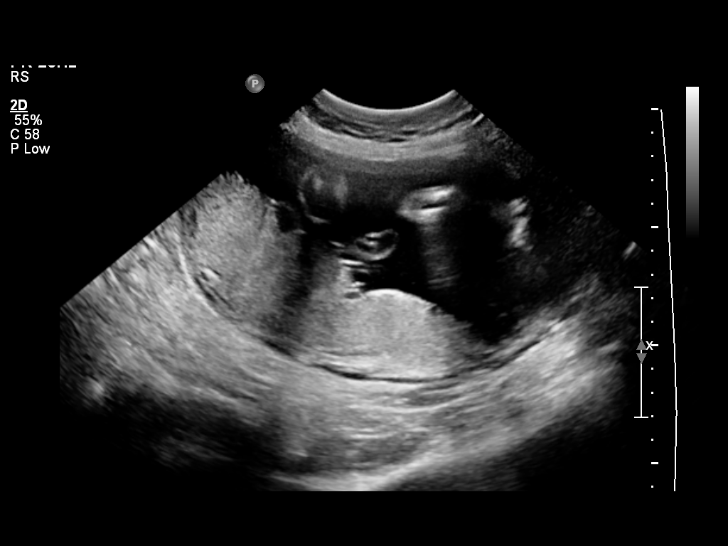
[im 20/107]
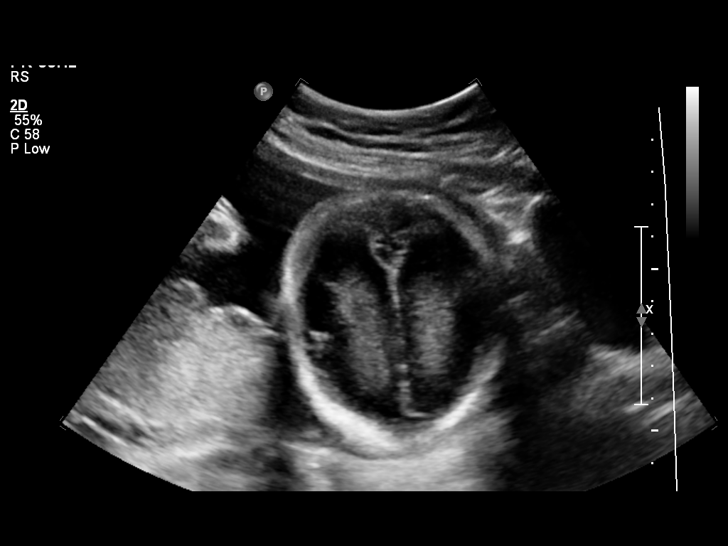
[im 32/107]
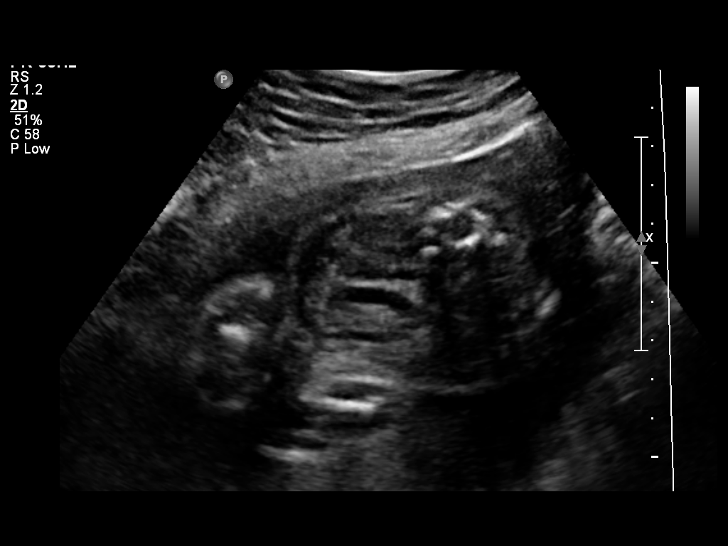
[im 40/107]
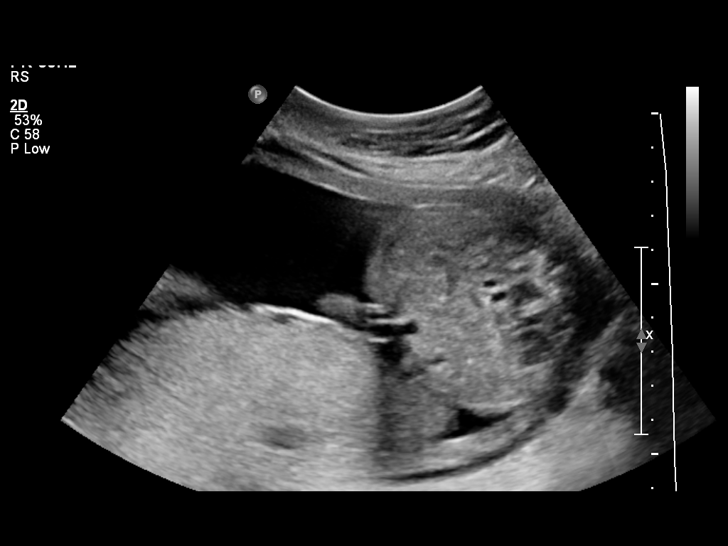
[im 48/107]
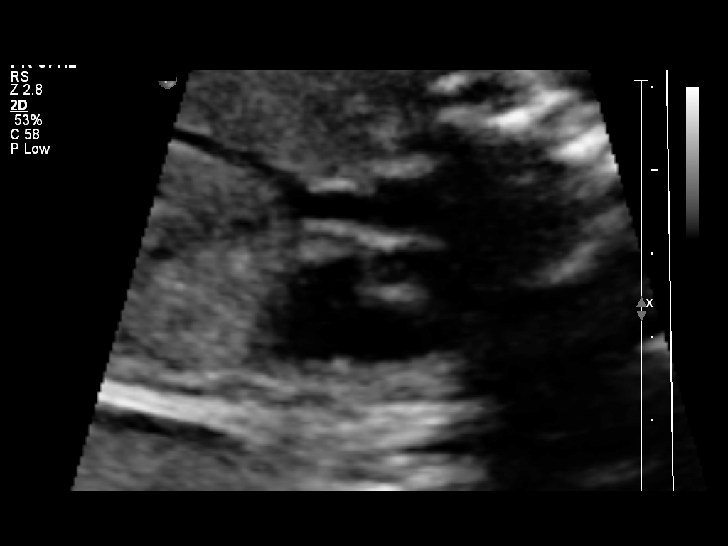
[im 59/107]
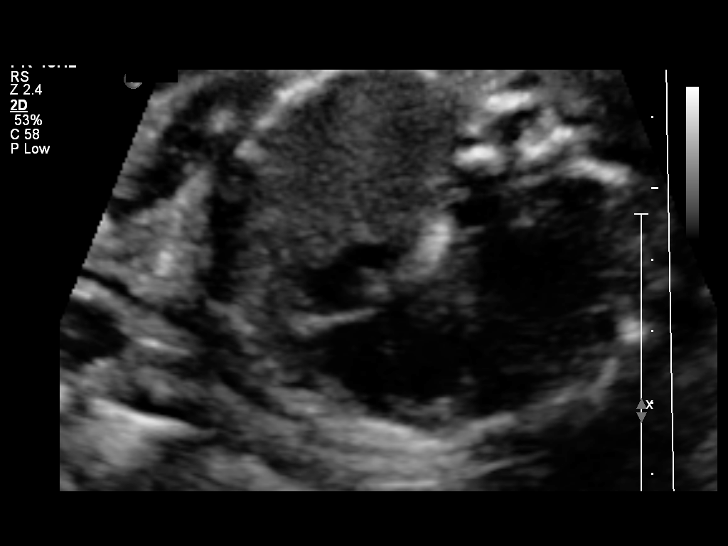
[im 67/107]
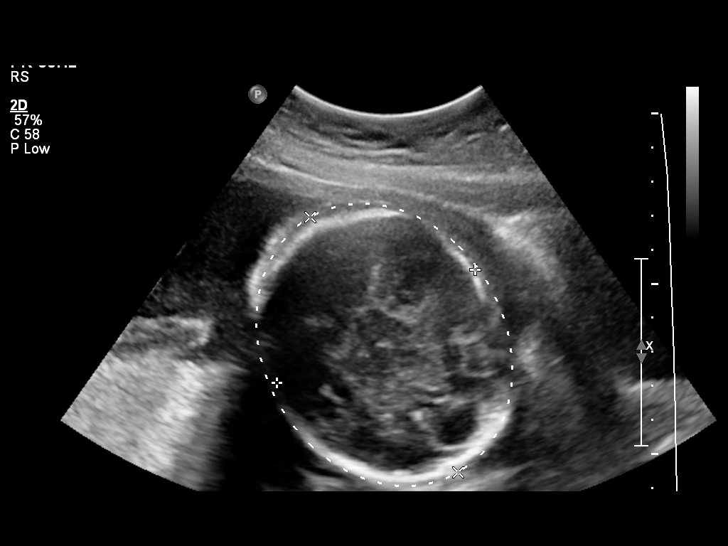
[im 75/107]
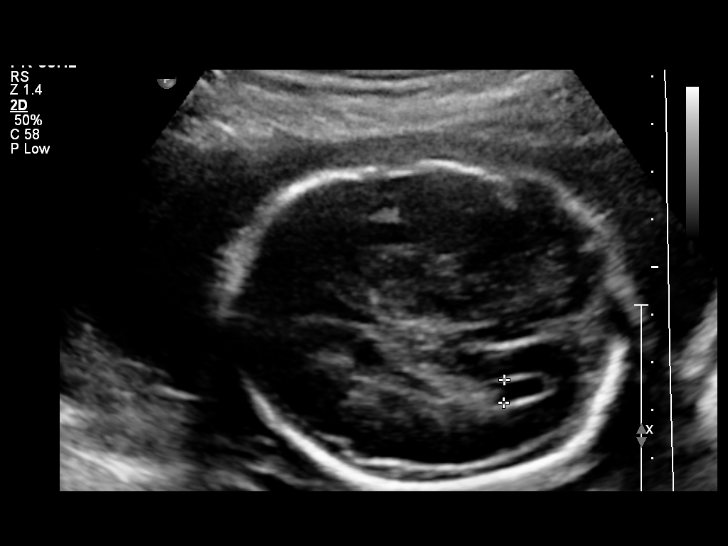
[im 87/107]
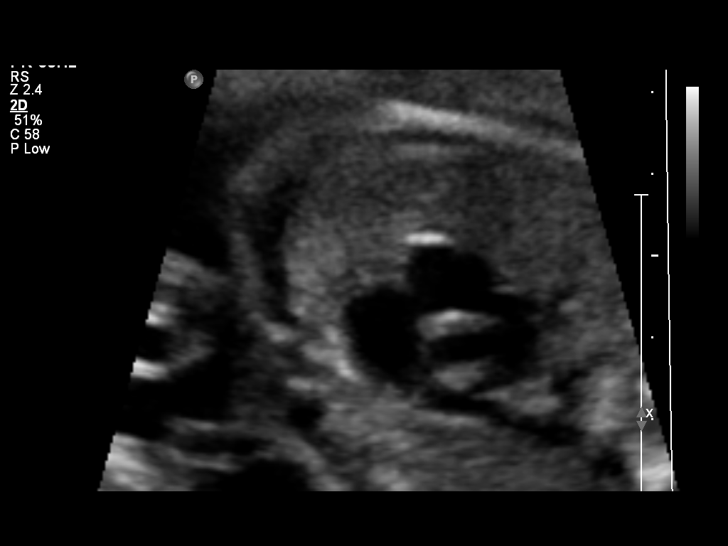
[im 95/107]
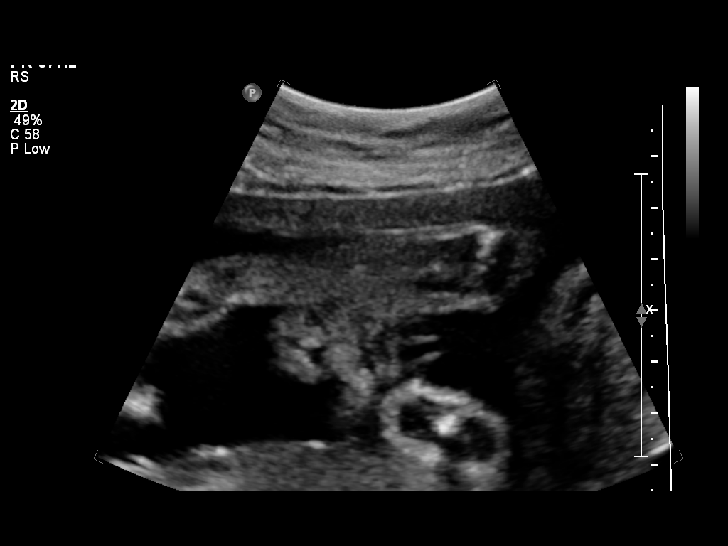
[im 103/107]
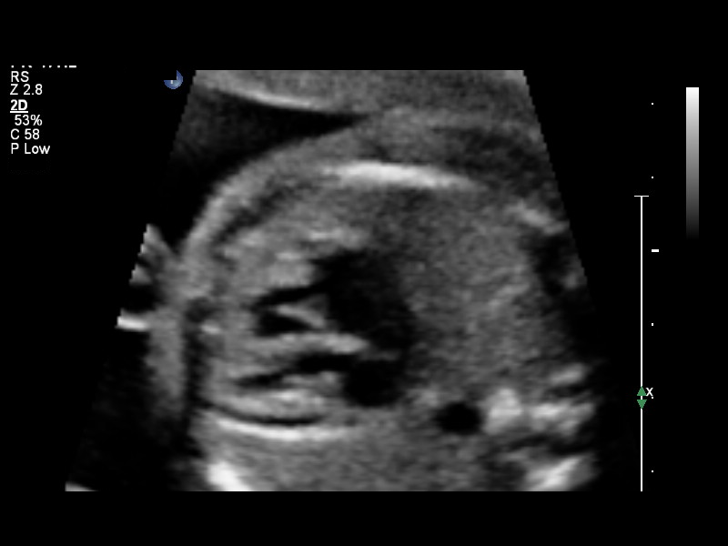

[12 of 28 positions shown; findings below may reference images not displayed]

OBSTETRICS REPORT
                      (Signed Final 01/17/2014 [DATE])

Service(s) Provided

 US OB COMP + 14 WK                                    76805.1
Indications

 Detailed fetal anatomic survey
 Maternal morbid obesity
Fetal Evaluation

 Num Of Fetuses:    1
 Fetal Heart Rate:  142                          bpm
 Cardiac Activity:  Observed
 Presentation:      Cephalic
 Placenta:          Posterior, above cervical
                    os
 P. Cord            Visualized
 Insertion:

 Amniotic Fluid
 AFI FV:      Subjectively within normal limits
                                             Larg Pckt:     5.1  cm
Biometry

 BPD:     64.9  mm     G. Age:  26w 2d                CI:        70.03   70 - 86
                                                      FL/HC:      19.7   18.6 -

 HC:     247.4  mm     G. Age:  26w 6d       50  %    HC/AC:      1.16   1.04 -

 AC:     213.2  mm     G. Age:  25w 6d       32  %    FL/BPD:     75.2   71 - 87
 FL:      48.8  mm     G. Age:  26w 3d       44  %    FL/AC:      22.9   20 - 24

 Est. FW:     899  gm           2 lb     54  %
Gestational Age

 LMP:           26w 1d        Date:  07/18/13                 EDD:   04/24/14
 U/S Today:     26w 3d                                        EDD:   04/22/14
 Best:          26w 1d     Det. By:  LMP  (07/18/13)          EDD:   04/24/14
Anatomy

 Cranium:          Appears normal         Aortic Arch:      Not well visualized
 Fetal Cavum:      Appears normal         Ductal Arch:      Not well visualized
 Ventricles:       Appears normal         Diaphragm:        Appears normal
 Choroid Plexus:   Appears normal         Stomach:          Appears normal, left
                                                            sided
 Cerebellum:       Appears normal         Abdomen:          Appears normal
 Posterior Fossa:  Appears normal         Abdominal Wall:   Appears nml (cord
                                                            insert, abd wall)
 Nuchal Fold:      Not applicable (>20    Cord Vessels:     Appears normal (3
                   wks GA)                                  vessel cord)
 Face:             Appears normal         Kidneys:          Appear normal
                   (orbits and profile)
 Lips:             Appears normal         Bladder:          Appears normal
 Heart:            Appears normal         Spine:            Appears normal
                   (4CH, axis, and
                   situs)
 RVOT:             Appears normal         Lower             Appears normal
                                          Extremities:
 LVOT:             Appears normal         Upper             Appears normal
                                          Extremities:

 Other:  Male gender. Heels and 5th digit visualized. Technically difficult due
         to maternal habitus and fetal position.
Targeted Anatomy

 Fetal Central Nervous System
 Cisterna Magna:
Cervix Uterus Adnexa

 Cervical Length:    3.4      cm

 Cervix:       Normal appearance by transabdominal scan.
 Left Ovary:    Size(cm) L: 2.79 x W: 1.41 x H: 1.39  Volume(cc):
 Right Ovary:   Size(cm) L: 2.41 x W: 0.84 x H: 1.08  Volume(cc):
Impression

 SIUP at 91w0d (remote read)
 EFW 54th%
 No dysmorphic features
 Limitations as detailed above
 No previa
Recommendations

 In setting of morbid obesity, fundal heights are likely
 unreliable in terms of assessing fetal growth adequacy.
 Hence, I recommend follow up interval growth in 6 weeks.

 questions or concerns.

## 2014-01-18 ENCOUNTER — Other Ambulatory Visit: Payer: Self-pay | Admitting: *Deleted

## 2014-01-25 ENCOUNTER — Ambulatory Visit (INDEPENDENT_AMBULATORY_CARE_PROVIDER_SITE_OTHER): Payer: Medicaid Other | Admitting: Obstetrics & Gynecology

## 2014-01-25 ENCOUNTER — Encounter: Payer: Self-pay | Admitting: Obstetrics & Gynecology

## 2014-01-25 VITALS — BP 118/57 | HR 85 | Temp 97.8°F | Wt 211.0 lb

## 2014-01-25 DIAGNOSIS — Z3480 Encounter for supervision of other normal pregnancy, unspecified trimester: Secondary | ICD-10-CM

## 2014-01-25 DIAGNOSIS — Z3482 Encounter for supervision of other normal pregnancy, second trimester: Secondary | ICD-10-CM

## 2014-01-25 LAB — POCT URINALYSIS DIPSTICK
Blood, UA: NEGATIVE
GLUCOSE UA: NEGATIVE
KETONES UA: NEGATIVE
Nitrite, UA: NEGATIVE
Protein, UA: NEGATIVE
SPEC GRAV UA: 1.02
pH, UA: 6

## 2014-01-25 NOTE — Patient Instructions (Signed)
Pregnancy and Travel Most pregnant woman can safely travel until the last month of the pregnancy. However, pregnant women with medical problems or problems with their pregnancy should limit or avoid travel. The best time to travel is between 14 and 28 weeks of the pregnancy. During this period, morning sickness should be minimal and other problems are less likely to develop. GENERAL TRAVEL TIPS Before you go:  Discuss your trip with your health care provider and get examined shortly before you go.  Get a copy of your medical records and be sure to take them with you.  Try to get names of doctors and hospitals in the area you will be visiting.  Pack your pillow if you can.  Get a good night's sleep the night before you make your trip. During your trip:  Ask for locations of doctors and hospitals if you did not do this before leaving.  Wear flat, comfortable shoes.  Eat a balanced diet, drink a lot of fluids, and take your vitamins and supplements.  Do not wear yourself out.  Do not ride on a motorcycle.  Rest. If you spent a lot of time traveling, lie down for 30 or more minutes with your feet slightly raised after you reach your destination. TIPS FOR TRAVELING TO A FOREIGN COUNTRY Before you go:  Ask your health care provider if there are any medicines that are safe to take if you get diarrhea, constipation, nausea, or vomiting.  Make copies of your medical records in case you lose the originals. During your trip:  Do not eat uncooked foods of any kind.  Drink bottled water and do not use ice.  Wash fruits and vegetables with hot, soapy water.  Only drink pasteurized milk. TIPS FOR TRAVELING BY CAR  Wear your seat belt properly.  If you are in the front seat, sit as far away from the dashboard as possible to avoid getting hit hard if the air bag deploys in an accident.  Stop about every 2 hours to use the restroom and walk around. This helps the circulation in your  legs.  Keep water, crackers, and fruit in the car.  Do not travel for more than 6 hours a day. TIPS FOR TRAVELING BY BUS  Before making a reservation, ask whether your bus will have a restroom.  Take water, crackers, and fruit with you.  Get out and walk around if and when the bus stops.  Move your arms and legs when seated. This helps with your circulation. TIPS FOR TRAVELING BY TRAIN Before making a reservation, ask if your train will have a sleeping car and more than one restroom. TIPS FOR TRAVELING BY AIRPLANE  Before booking your trip, ask about the airline's rules about pregnancy. Pregnant women may be restricted from flying after a certain time of the pregnancy. Every airline has its own rules and regulations.  Ask whether the airplane cabin will be pressurized. Do not board an unpressurized plane that will fly above 7,000 ft (2,100 km).  Try to get a bulkhead or an aisle seat.  Wear layered clothing because the temperature in the cabin can change.  Take water, crackers, and fruit with you on the airplane.  Put all your medicines and medical records in your carry-on bag.  Avoid drinks with caffeine and do not eat a big meal.  Do not walk around the airplane to stretch your legs.  Move your arms and legs while sitting to help with your circulation.  Wear your  seat belt at all times. TIPS FOR TRAVELING BY CRUISE SHIP  Before booking your trip, ask the following questions:  Are pregnant women allowed on the cruise ship?  Is there a medical facility and health care provider on board?  Does the ship dock in cities where there are health care providers and medical facilities?  Before booking your trip, ask your health care provider if:  It is safe for you to take medicines if you get seasick.  It is safe for you to wear acupressure wristbands to prevent getting seasick. If your health care provider says it is safe, consider purchasing one. Document Released:  05/21/2008 Document Revised: 06/13/2013 Document Reviewed: 05/05/2013 [facility] Patient Information 2015 [place], [place]. This information is not intended to replace advice given to you by your health care provider. Make sure you discuss any questions you have with your health care provider.

## 2014-01-25 NOTE — Progress Notes (Signed)
Subjective:    Morgan [name] is a 28 y.o. female being seen today for her obstetrical visit. She is at 4953w2d gestation. Patient reports: no complaints . Fetal movement: normal.  Problem List Items Addressed This Visit     Unprioritized   Supervision of other normal pregnancy - Primary   Relevant Orders      POCT urinalysis dipstick     Patient Active Problem List   Diagnosis Date Noted  . Iron deficiency anemia 01/05/2014  . Hepatitis B carrier 12/11/2013  . Supervision of other normal pregnancy 12/11/2013  . Short interval between pregnancies affecting pregnancy, antepartum 12/11/2013  . Obesity (BMI 35.0-39.9 without comorbidity) 12/11/2013  . Hyperprolactinemia 06/01/2013   Objective:    BP 118/57  Pulse 85  Temp(Src) 97.8 F (36.6 C)  Wt 95.709 kg (211 lb)  LMP 07/18/2013 FHT: 160 BPM  Uterine Size: size equals dates     Assessment:    Pregnancy @ 4953w2d     Plan:   Labs, problem list reviewed and updated Follow up in 2 weeks.

## 2014-02-06 ENCOUNTER — Telehealth: Payer: Self-pay

## 2014-02-06 ENCOUNTER — Other Ambulatory Visit: Payer: Self-pay | Admitting: *Deleted

## 2014-02-06 DIAGNOSIS — O365931 Maternal care for other known or suspected poor fetal growth, third trimester, fetus 1: Secondary | ICD-10-CM

## 2014-02-06 NOTE — Telephone Encounter (Signed)
let patient know u/s appt on 02/28/14 @ 1:45pm

## 2014-02-08 ENCOUNTER — Ambulatory Visit (INDEPENDENT_AMBULATORY_CARE_PROVIDER_SITE_OTHER): Payer: Medicaid Other | Admitting: Obstetrics & Gynecology

## 2014-02-08 ENCOUNTER — Encounter: Payer: Self-pay | Admitting: Obstetrics & Gynecology

## 2014-02-08 VITALS — BP 110/75 | HR 101 | Temp 98.5°F | Wt 210.0 lb

## 2014-02-08 DIAGNOSIS — Z3480 Encounter for supervision of other normal pregnancy, unspecified trimester: Secondary | ICD-10-CM

## 2014-02-08 DIAGNOSIS — Z3483 Encounter for supervision of other normal pregnancy, third trimester: Secondary | ICD-10-CM

## 2014-02-08 DIAGNOSIS — K589 Irritable bowel syndrome without diarrhea: Secondary | ICD-10-CM

## 2014-02-08 DIAGNOSIS — Z23 Encounter for immunization: Secondary | ICD-10-CM

## 2014-02-08 DIAGNOSIS — B181 Chronic viral hepatitis B without delta-agent: Secondary | ICD-10-CM

## 2014-02-08 LAB — POCT URINALYSIS DIPSTICK
Blood, UA: NEGATIVE
Glucose, UA: NEGATIVE
KETONES UA: NEGATIVE
NITRITE UA: NEGATIVE
Spec Grav, UA: 1.02
pH, UA: 5

## 2014-02-08 MED ORDER — TETANUS-DIPHTH-ACELL PERTUSSIS 5-2.5-18.5 LF-MCG/0.5 IM SUSP
0.5000 mL | Freq: Once | INTRAMUSCULAR | Status: AC
  Administered 2014-02-08: 0.5 mL via INTRAMUSCULAR

## 2014-02-08 MED ORDER — DICYCLOMINE HCL 20 MG PO TABS: 20.0000 mg | ORAL_TABLET | Freq: Three times a day (TID) | ORAL | Status: DC

## 2014-02-08 NOTE — Progress Notes (Signed)
Subjective:    Morgan [name] is a 28 y.o. female being seen today for her obstetrical visit. She is at 7555w2d gestation. Patient reports no complaints and IBS symptoms--diarrhea. Fetal movement: normal.  Problem List Items Addressed This Visit     Unprioritized   Hepatitis B carrier   Supervision of other normal pregnancy - Primary   Relevant Medications      Tdap (BOOSTRIX) injection 0.5 mL (Completed)      BENTYL 20 MG PO TABS   Other Relevant Orders      POCT urinalysis dipstick (Completed)      Flu Vaccine QUAD 36+ mos IM (Completed)     Patient Active Problem List   Diagnosis Date Noted  . Iron deficiency anemia 01/05/2014  . Hepatitis B carrier 12/11/2013  . Supervision of other normal pregnancy 12/11/2013  . Short interval between pregnancies affecting pregnancy, antepartum 12/11/2013  . Obesity (BMI 35.0-39.9 without comorbidity) 12/11/2013  . Hyperprolactinemia 06/01/2013   Objective:    BP 110/75  Pulse 101  Temp(Src) 98.5 F (36.9 C)  Wt 95.255 kg (210 lb)  LMP 07/18/2013 FHT:  160 BPM  Uterine Size: size equals dates  Presentation: unsure     Assessment:    Pregnancy @ 6255w2d weeks  IBS--symptomatic Plan:     labs reviewed, problem list updated TDAP/flu vaccine offered   Orders Placed This Encounter  Procedures  . Flu Vaccine QUAD 36+ mos IM  . POCT urinalysis dipstick   Meds ordered this encounter  Medications  . Tdap (BOOSTRIX) injection 0.5 mL    Sig:   . dicyclomine (BENTYL) 20 MG tablet    Sig: Take 1 tablet (20 mg total) by mouth 4 (four) times daily - after meals and at bedtime.    Dispense:  120 tablet    Refill:  5   Follow up in 2 Weeks.

## 2014-02-08 NOTE — Progress Notes (Signed)
Patient reports she is having pain and pressure without change. Patient is requesting Tdap and flu shot today

## 2014-02-11 DIAGNOSIS — K589 Irritable bowel syndrome without diarrhea: Secondary | ICD-10-CM | POA: Insufficient documentation

## 2014-02-11 NOTE — Patient Instructions (Signed)

## 2014-02-22 ENCOUNTER — Encounter: Payer: Self-pay | Admitting: Obstetrics & Gynecology

## 2014-02-22 ENCOUNTER — Ambulatory Visit (INDEPENDENT_AMBULATORY_CARE_PROVIDER_SITE_OTHER): Payer: Medicaid Other | Admitting: Obstetrics & Gynecology

## 2014-02-22 VITALS — BP 114/72 | Temp 98.1°F | Wt 211.0 lb

## 2014-02-22 DIAGNOSIS — Z3483 Encounter for supervision of other normal pregnancy, third trimester: Secondary | ICD-10-CM

## 2014-02-22 DIAGNOSIS — Z3480 Encounter for supervision of other normal pregnancy, unspecified trimester: Secondary | ICD-10-CM

## 2014-02-22 LAB — POCT URINALYSIS DIPSTICK
BILIRUBIN UA: NEGATIVE
Blood, UA: NEGATIVE
GLUCOSE UA: NEGATIVE
Ketones, UA: NEGATIVE
LEUKOCYTES UA: NEGATIVE
Nitrite, UA: NEGATIVE
Protein, UA: NEGATIVE
Spec Grav, UA: <=1.005
UROBILINOGEN UA: NEGATIVE
pH, UA: 7

## 2014-02-22 NOTE — Progress Notes (Signed)
Subjective:    Morgan [name] is a 28 y.o. female being seen today for her obstetrical visit. She is at [redacted]w[redacted]d gestation. Patient reports IBS symptoms improving. Fetal movement: normal.  Problem List Items Addressed This Visit     Unprioritized   Supervision of other normal pregnancy - Primary   Relevant Orders      POCT urinalysis dipstick     Patient Active Problem List   Diagnosis Date Noted  . IBS (irritable bowel syndrome) 02/11/2014  . Iron deficiency anemia 01/05/2014  . Hepatitis B carrier 12/11/2013  . Supervision of other normal pregnancy 12/11/2013  . Short interval between pregnancies affecting pregnancy, antepartum 12/11/2013  . Obesity (BMI 35.0-39.9 without comorbidity) 12/11/2013  . Hyperprolactinemia 06/01/2013   Objective:    BP 114/72  Temp(Src) 98.1 F (36.7 C)  Wt 95.709 kg (211 lb)  LMP 07/18/2013 FHT:  140 BPM  Uterine Size: size equals dates  Presentation: unsure     Assessment:    Pregnancy @ [redacted]w[redacted]d weeks  IBS--symptomatic Plan:     labs reviewed, problem list updated     Orders Placed This Encounter  Procedures  . POCT urinalysis dipstick   Follow up in 2 Weeks.

## 2014-02-23 NOTE — Patient Instructions (Signed)
Third Trimester of Pregnancy The third trimester is from week 29 through week 42, months 7 through 9. The third trimester is a time when the fetus is growing rapidly. At the end of the ninth month, the fetus is about 20 inches in length and weighs 6-10 pounds.  BODY CHANGES Your body goes through many changes during pregnancy. The changes vary from woman to woman.   Your weight will continue to increase. You can expect to gain 25-35 pounds (11-16 kg) by the end of the pregnancy.  You may begin to get stretch marks on your hips, abdomen, and breasts.  You may urinate more often because the fetus is moving lower into your pelvis and pressing on your bladder.  You may develop or continue to have heartburn as a result of your pregnancy.  You may develop constipation because certain hormones are causing the muscles that push waste through your intestines to slow down.  You may develop hemorrhoids or swollen, bulging veins (varicose veins).  You may have pelvic pain because of the weight gain and pregnancy hormones relaxing your joints between the bones in your pelvis. Backaches may result from overexertion of the muscles supporting your posture.  You may have changes in your hair. These can include thickening of your hair, rapid growth, and changes in texture. Some women also have hair loss during or after pregnancy, or hair that feels dry or thin. Your hair will most likely return to normal after your baby is born.  Your breasts will continue to grow and be tender. A yellow discharge may leak from your breasts called colostrum.  Your belly button may stick out.  You may feel short of breath because of your expanding uterus.  You may notice the fetus "dropping," or moving lower in your abdomen.  You may have a bloody mucus discharge. This usually occurs a few days to a week before labor begins.  Your cervix becomes thin and soft (effaced) near your due date. WHAT TO EXPECT AT YOUR PRENATAL  EXAMS  You will have prenatal exams every 2 weeks until week 36. Then, you will have weekly prenatal exams. During a routine prenatal visit:  You will be weighed to make sure you and the fetus are growing normally.  Your blood pressure is taken.  Your abdomen will be measured to track your baby's growth.  The fetal heartbeat will be listened to.  Any test results from the previous visit will be discussed.  You may have a cervical check near your due date to see if you have effaced. At around 36 weeks, your caregiver will check your cervix. At the same time, your caregiver will also perform a test on the secretions of the vaginal tissue. This test is to determine if a type of bacteria, Group B streptococcus, is present. Your caregiver will explain this further. Your caregiver may ask you:  What your birth plan is.  How you are feeling.  If you are feeling the baby move.  If you have had any abnormal symptoms, such as leaking fluid, bleeding, severe headaches, or abdominal cramping.  If you have any questions. Other tests or screenings that may be performed during your third trimester include:  Blood tests that check for low iron levels (anemia).  Fetal testing to check the health, activity level, and growth of the fetus. Testing is done if you have certain medical conditions or if there are problems during the pregnancy. FALSE LABOR You may feel small, irregular contractions that   eventually go away. These are called Braxton Hicks contractions, or false labor. Contractions may last for hours, days, or even weeks before true labor sets in. If contractions come at regular intervals, intensify, or become painful, it is best to be seen by your caregiver.  SIGNS OF LABOR   Menstrual-like cramps.  Contractions that are 5 minutes apart or less.  Contractions that start on the top of the uterus and spread down to the lower abdomen and back.  A sense of increased pelvic pressure or back  pain.  A watery or bloody mucus discharge that comes from the vagina. If you have any of these signs before the 37th week of pregnancy, call your caregiver right away. You need to go to the hospital to get checked immediately. HOME CARE INSTRUCTIONS   Avoid all smoking, herbs, alcohol, and unprescribed drugs. These chemicals affect the formation and growth of the baby.  Follow your caregiver's instructions regarding medicine use. There are medicines that are either safe or unsafe to take during pregnancy.  Exercise only as directed by your caregiver. Experiencing uterine cramps is a good sign to stop exercising.  Continue to eat regular, healthy meals.  Wear a good support bra for breast tenderness.  Do not use hot tubs, steam rooms, or saunas.  Wear your seat belt at all times when driving.  Avoid raw meat, uncooked cheese, cat litter boxes, and soil used by cats. These carry germs that can cause birth defects in the baby.  Take your prenatal vitamins.  Try taking a stool softener (if your caregiver approves) if you develop constipation. Eat more high-fiber foods, such as fresh vegetables or fruit and whole grains. Drink plenty of fluids to keep your urine clear or pale yellow.  Take warm sitz baths to soothe any pain or discomfort caused by hemorrhoids. Use hemorrhoid cream if your caregiver approves.  If you develop varicose veins, wear support hose. Elevate your feet for 15 minutes, 3-4 times a day. Limit salt in your diet.  Avoid heavy lifting, wear low heal shoes, and practice good posture.  Rest a lot with your legs elevated if you have leg cramps or low back pain.  Visit your dentist if you have not gone during your pregnancy. Use a soft toothbrush to brush your teeth and be gentle when you floss.  A sexual relationship may be continued unless your caregiver directs you otherwise.  Do not travel far distances unless it is absolutely necessary and only with the approval  of your caregiver.  Take prenatal classes to understand, practice, and ask questions about the labor and delivery.  Make a trial run to the hospital.  Pack your hospital bag.  Prepare the baby's nursery.  Continue to go to all your prenatal visits as directed by your caregiver. SEEK MEDICAL CARE IF:  You are unsure if you are in labor or if your water has broken.  You have dizziness.  You have mild pelvic cramps, pelvic pressure, or nagging pain in your abdominal area.  You have persistent nausea, vomiting, or diarrhea.  You have a bad smelling vaginal discharge.  You have pain with urination. SEEK IMMEDIATE MEDICAL CARE IF:   You have a fever.  You are leaking fluid from your vagina.  You have spotting or bleeding from your vagina.  You have severe abdominal cramping or pain.  You have rapid weight loss or gain.  You have shortness of breath with chest pain.  You notice sudden or extreme swelling   of your face, hands, ankles, feet, or legs.  You have not felt your baby move in over an hour.  You have severe headaches that do not go away with medicine.  You have vision changes. Document Released: 06/02/2001 Document Revised: 06/13/2013 Document Reviewed: 08/09/2012 [employer] Patient Information 2015 [employer]. This information is not intended to replace advice given to you by your health care provider. Make sure you discuss any questions you have with your health care provider.  

## 2014-02-28 ENCOUNTER — Ambulatory Visit (HOSPITAL_COMMUNITY)
Admission: RE | Admit: 2014-02-28 | Discharge: 2014-02-28 | Disposition: A | Discharge status: Home / Self Care | Payer: Medicaid Other | Source: Ambulatory Visit | Attending: Obstetrics & Gynecology | Admitting: Obstetrics & Gynecology

## 2014-02-28 DIAGNOSIS — E669 Obesity, unspecified: Secondary | ICD-10-CM | POA: Insufficient documentation

## 2014-02-28 DIAGNOSIS — O99210 Obesity complicating pregnancy, unspecified trimester: Secondary | ICD-10-CM

## 2014-02-28 DIAGNOSIS — O365931 Maternal care for other known or suspected poor fetal growth, third trimester, fetus 1: Secondary | ICD-10-CM

## 2014-02-28 DIAGNOSIS — O365990 Maternal care for other known or suspected poor fetal growth, unspecified trimester, not applicable or unspecified: Secondary | ICD-10-CM | POA: Insufficient documentation

## 2014-02-28 DIAGNOSIS — Z3689 Encounter for other specified antenatal screening: Secondary | ICD-10-CM

## 2014-02-28 IMAGING — US US OB FOLLOW-UP
1 series · 12 of 28 positions shown · non-contrast
Comparison: none

[Series 1: us ob follow-up · 0.18mm/px · 12 of 43 slices shown]
[im 2/43]
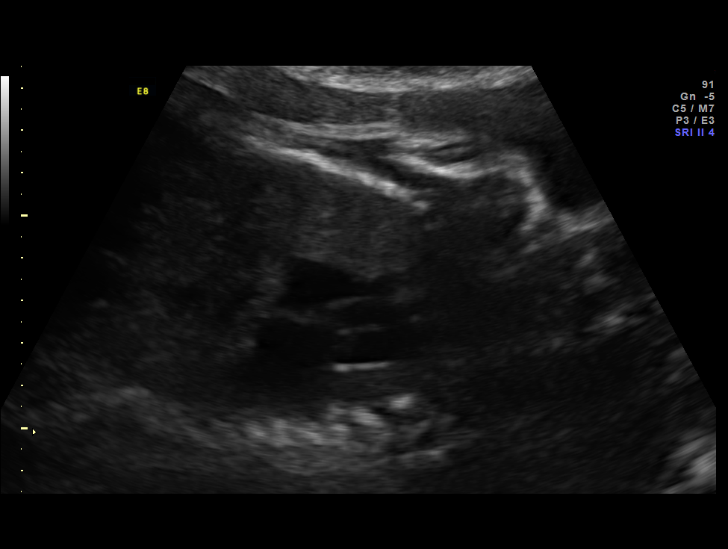
[im 5/43]
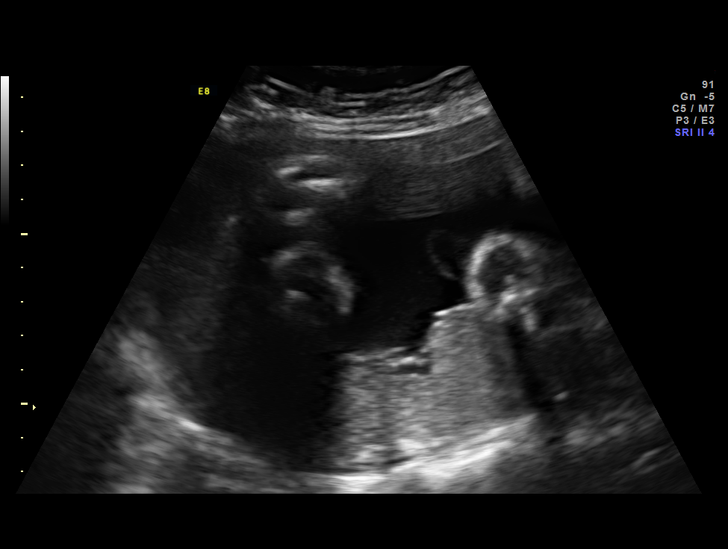
[im 8/43]
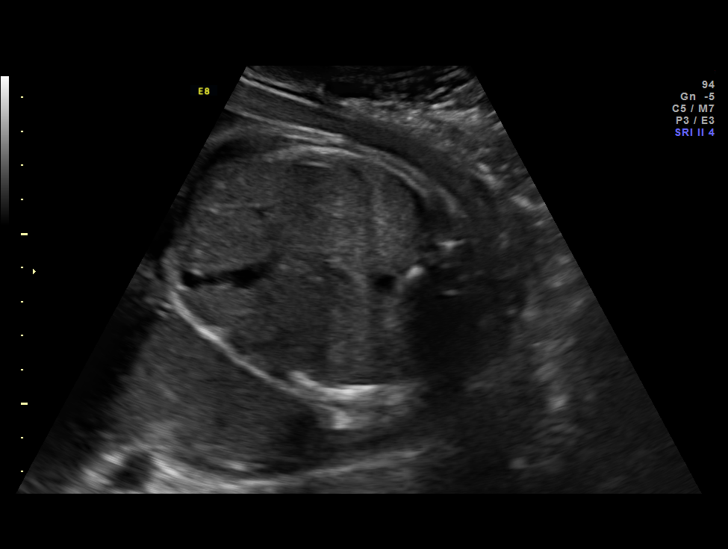
[im 13/43]
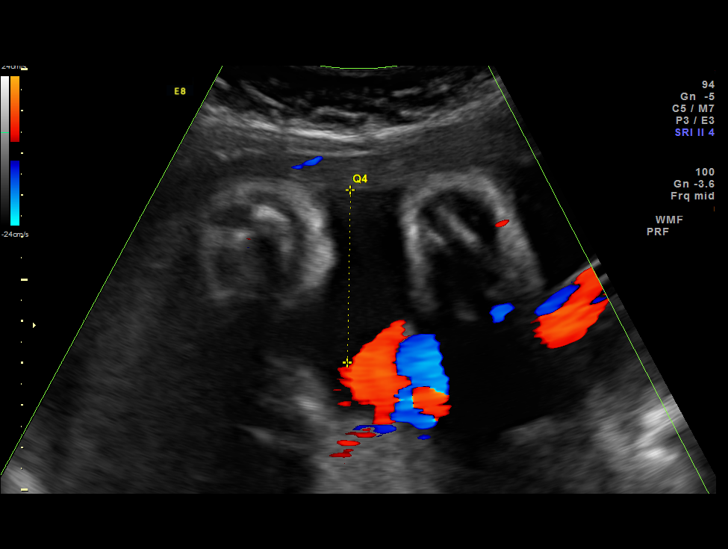
[im 16/43]
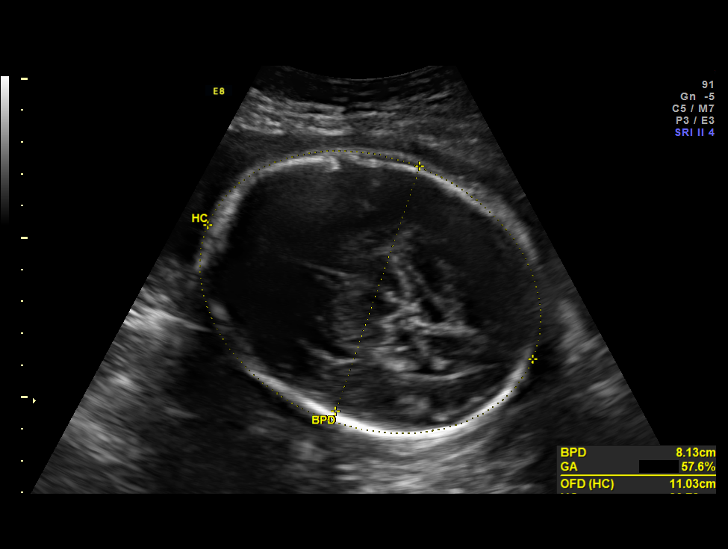
[im 19/43]
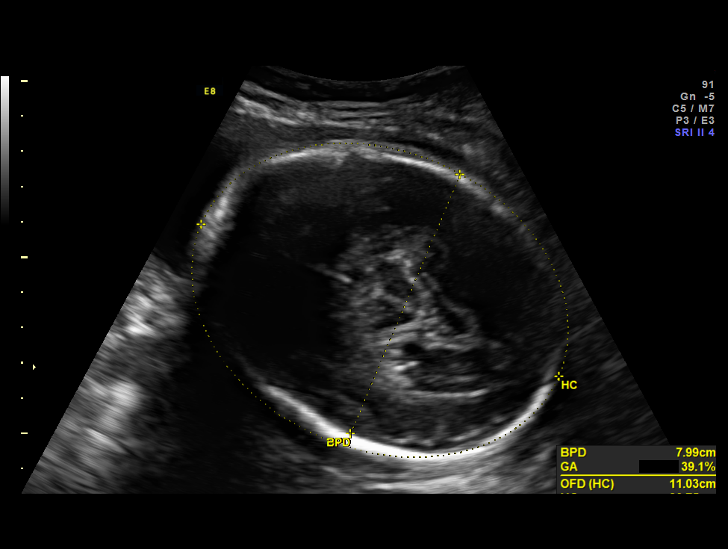
[im 24/43]
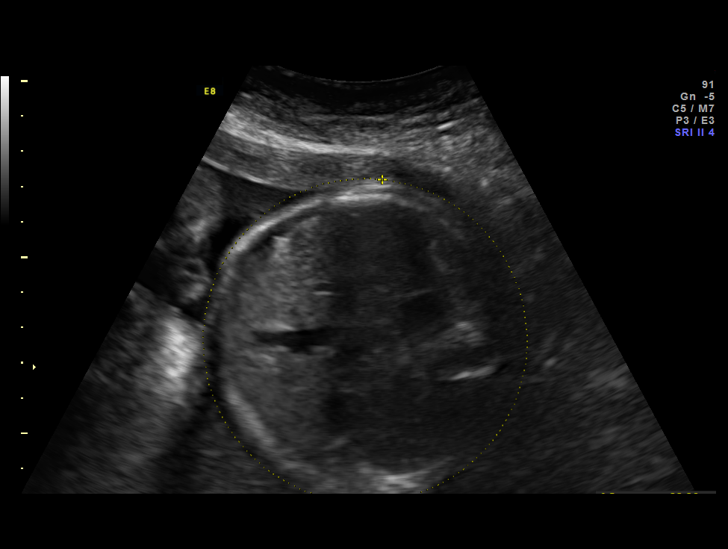
[im 27/43]
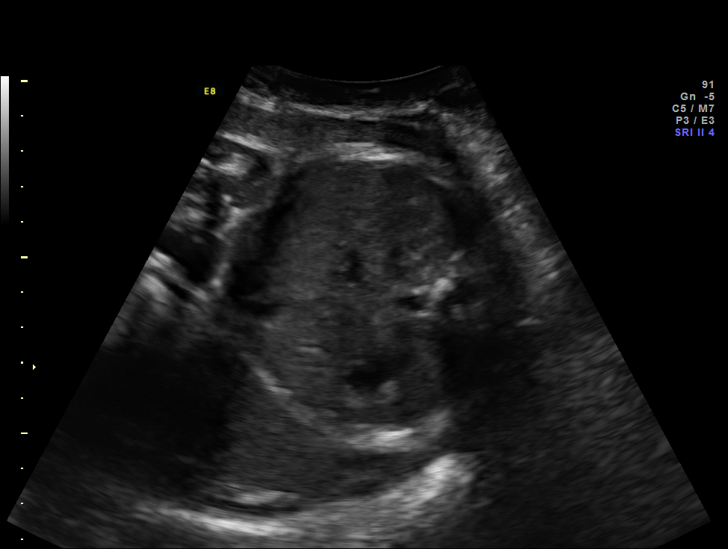
[im 30/43]
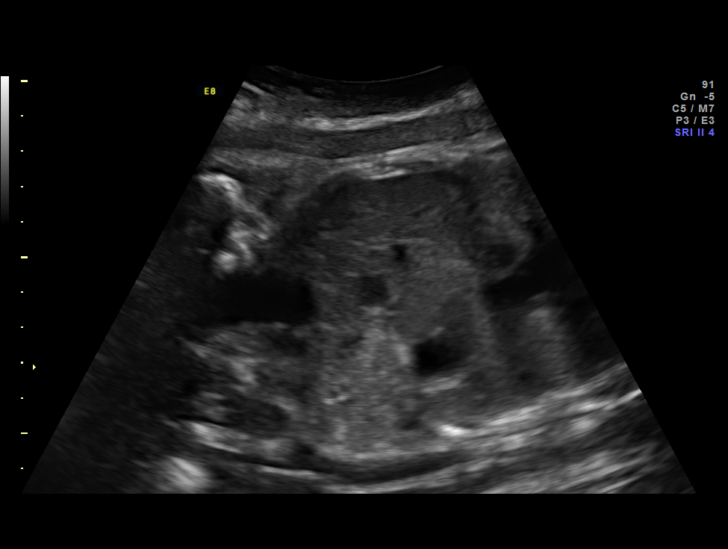
[im 35/43]
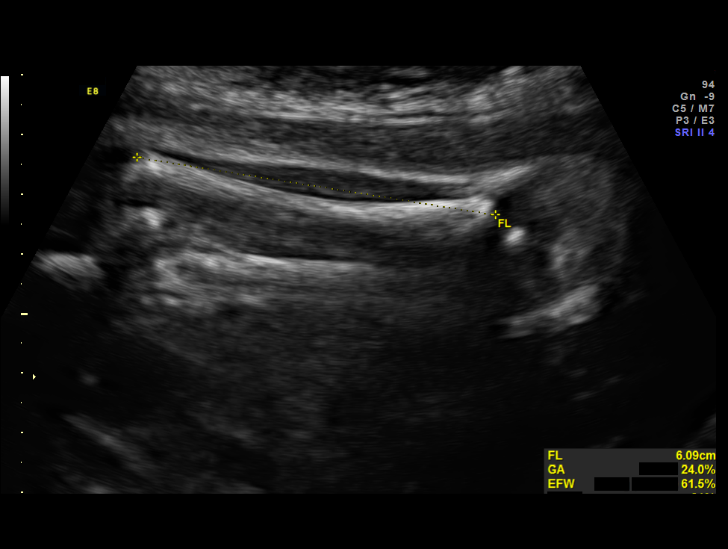
[im 38/43]
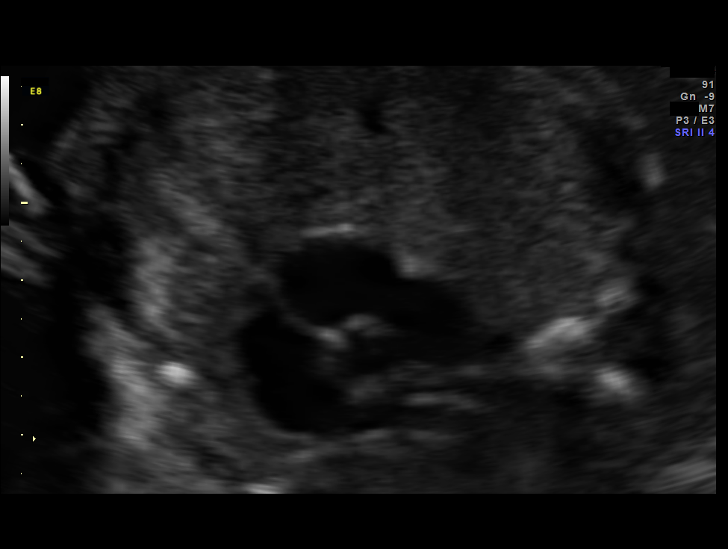
[im 41/43]
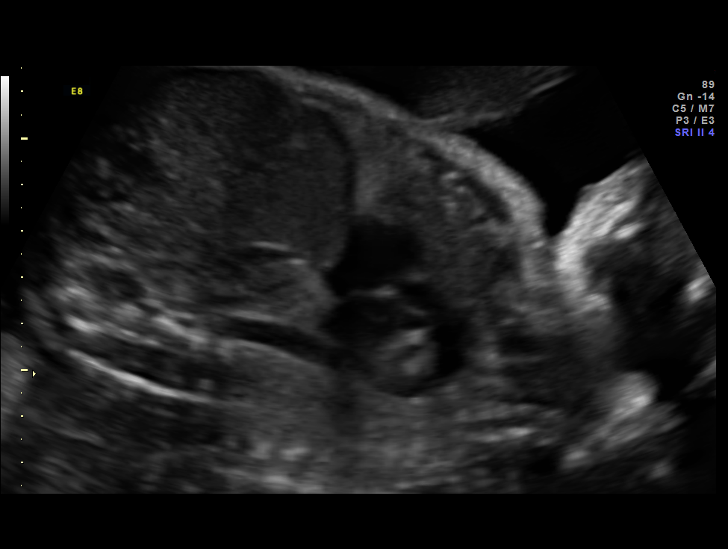

[12 of 28 positions shown; findings below may reference images not displayed]

OBSTETRICS REPORT
                      (Signed Final 02/28/2014 [DATE])

Service(s) Provided

 US OB FOLLOW UP                                       76816.1
Indications

 Maternal morbid obesity
 Follow-up incomplete fetal anatomic evaluation
Fetal Evaluation

 Num Of Fetuses:    1
 Fetal Heart Rate:  150                          bpm
 Cardiac Activity:  Observed
 Presentation:      Cephalic
 Placenta:          Posterior Fundal, above
                    cervical os
 P. Cord            Previously Visualized
 Insertion:

 Amniotic Fluid
 AFI FV:      Subjectively within normal limits
 AFI Sum:     12      cm       32  %Tile     Larg Pckt:     4.1  cm
 RUQ:   2.8     cm   RLQ:    4.1    cm    LUQ:   1.1     cm   LLQ:    4      cm
Biometry

 BPD:       80  mm     G. Age:  32w 1d                CI:        68.86   70 - 86
                                                      FL/HC:      19.8   19.1 -

 HC:       308  mm     G. Age:  34w 3d       72  %    HC/AC:      1.07   0.96 -

 AC:     288.6  mm     G. Age:  32w 6d       70  %    FL/BPD:     76.1   71 - 87
 FL:      60.9  mm     G. Age:  31w 5d       24  %    FL/AC:      21.1   20 - 24

 Est. FW:    1664  gm      4 lb 7 oz     65  %
Gestational Age

 LMP:           32w 1d        Date:  07/18/13                 EDD:   04/24/14
 U/S Today:     32w 5d                                        EDD:   04/20/14
 Best:          32w 1d     Det. By:  LMP  (07/18/13)          EDD:   04/24/14
Anatomy

 Cranium:          Appears normal         Aortic Arch:      Appears normal
 Fetal Cavum:      Previously seen        Ductal Arch:      Appears normal
 Ventricles:       Appears normal         Diaphragm:        Appears normal
 Choroid Plexus:   Previously seen        Stomach:          Appears normal, left
                                                            sided
 Cerebellum:       Previously seen        Abdomen:          Previously seen
 Posterior Fossa:  Previously seen        Abdominal Wall:   Previously seen
 Nuchal Fold:      Not applicable (>20    Cord Vessels:     Previously seen
                   wks GA)
 Face:             Orbits and profile     Kidneys:          Appear normal
                   previously seen
 Lips:             Previously seen        Bladder:          Appears normal
 Heart:            Appears normal         Spine:            Previously seen
                   (4CH, axis, and
                   situs)
 RVOT:             Appears normal         Lower             Previously seen
                                          Extremities:
 LVOT:             Appears normal         Upper             Previously seen
                                          Extremities:

 Other:  Male gender. Heels and 5th digit prev. visualized. Technically difficult
         due to maternal habitus and fetal position.
Cervix Uterus Adnexa

 Cervix:       Not visualized (advanced GA >17wks)
Impression

 Single IUP at 32w 1d
 Normal interval anatomy
 Fetal growth is appropriate (65th %tile)
 Posterior fundal placenta
 Normal amniotic fluid volume
Recommendations

 Follow-up ultrasounds as clinically indicated.

 questions or concerns.

## 2014-03-08 ENCOUNTER — Ambulatory Visit (INDEPENDENT_AMBULATORY_CARE_PROVIDER_SITE_OTHER): Payer: Medicaid Other | Admitting: Obstetrics & Gynecology

## 2014-03-08 ENCOUNTER — Encounter: Payer: Self-pay | Admitting: Obstetrics & Gynecology

## 2014-03-08 VITALS — BP 109/72 | HR 83 | Temp 96.4°F | Wt 209.0 lb

## 2014-03-08 DIAGNOSIS — Z3483 Encounter for supervision of other normal pregnancy, third trimester: Secondary | ICD-10-CM

## 2014-03-08 DIAGNOSIS — Z3480 Encounter for supervision of other normal pregnancy, unspecified trimester: Secondary | ICD-10-CM

## 2014-03-08 LAB — POCT URINALYSIS DIPSTICK
Bilirubin, UA: NEGATIVE
GLUCOSE UA: NEGATIVE
Ketones, UA: NEGATIVE
Leukocytes, UA: NEGATIVE
NITRITE UA: NEGATIVE
PROTEIN UA: NEGATIVE
RBC UA: NEGATIVE
Spec Grav, UA: 1.015
Urobilinogen, UA: NEGATIVE
pH, UA: 5

## 2014-03-14 NOTE — Patient Instructions (Signed)
Pregnancy, The Father's Role A father has an important role during their partners pregnancy, labor, delivery and afterward. It is important to help and support your partner through this new period. There are many physical and emotional changes that happen. To be helpful and supportive during this time, you should know and understand what is happening to your partner during the pregnancy, labor, delivery and postpartum period.  PREGNANCY Pregnancy lasts 40 weeks (plus or minus 2 weeks). The pregnancy is divided into three trimesters.   In the first 13 weeks, the mother feels tired, has painful breasts, may feel sick to her stomach (nauseated), throw up (vomit), urinates more often and may have mood changes. All of these changes are normal. If the father is aware of these, he can be more helpful, supportive and understanding. This may include helping with household duties and activities and spending more time with each other.  In the next 14 to 28 weeks, your partner is over the tiredness, nausea and vomiting. She will likely feel better and more energetic. This is the best time of the pregnancy to be more active together, go out more often or take trips. You will be able to see her belly popping out with the pregnancy. You may be able to feel the baby kick.  In the last 12 weeks, she may become more uncomfortable again because her abdomen is popping out more as the baby grows. She may have a hard time doing household chores, her balance may be off, she may have a hard time bending over, tires easily and has a tough time sleeping. At this time, you will realize the birth of your baby is close. You and your partner may have concerns about the safety of your partner and if the baby will be normal and healthy. These are all normal and natural feelings. You should talk with each other and your caregiver if you have any questions. Attend prenatal care visits with your partner. This is a good time for you to get  to know your caregiver, follow the pregnancy and ask questions. Prenatal visits are once a month for 6 months, then they are every 2 weeks for 2 months and then once a week the last month. You may have more prenatal visits if your caregiver feels it is needed. Your caregiver usually does an ultrasound of the baby at one of the prenatal visits or more often if needed. It is an exciting and emotional to see the baby moving and the heart beating.  Fathers can experience emotional changes during this time as well. These emotions can include happiness, excitement and feeling proud. Fathers may also be concerned about having new responsibilities. These include financial, educational and if it will change the relationship with his partner. These feelings are normal. They should be talked about openly and positively with each other. An important and often asked question is if sexual intercourse is safe during pregnancy and if it will harm the baby. Sexual intercourse is safe unless there is a problem with the pregnancy and your caregiver advises you to not have sexual intercourse. Because physical and emotional changes happen in pregnancy, your partner may not want to have sex during certain times. This is mostly true in the first and third trimesters. Trying different positions may make sexual intercourse comfortable. It is important for the both of you to discuss your feelings and desires with this problem. Talk to your caregiver about any questions you have about sexual intercourse during the   pregnancy. LABOR AND DELIVERY There are childbirth classes available for couples to take together. They help you understand what happens during labor and delivery. They also teach you how to help your partner with her labor pains, how to relax, breath properly during a contraction and focus on what is happening during labor. You may be asked to time the contractions, massage her back and breath with her during the contractions.  You are also there to see and enjoy the excitement your baby being born. If you have any feelings of fainting or are uncomfortable, tell someone to help you. You may be asked to leave the room if a problem develops during the labor or delivery. Sometimes a Cesarean Section (C-section) is scheduled or is an emergency during labor and delivery. A C-section is a major operation to deliver the baby. It is done through an incision in the abdomen and uterus. Your partner will be given a medicine to make her sleep (general anesthesia) or spinal anesthesia (numbing the body from the waist down). Most hospitals allow the father in the room for a C-section unless it is an emergency. Recovery from a C-section takes longer, is more uncomfortable and will require more help from the father. AFTER DELIVERY After the baby is born, the mother goes through many changes again. These changes could last 4 to 6 weeks or longer following a C-section. It is not unusual to be anxious, concerned and afraid that you may not be taking care of your newborn baby properly. Your partner may take a while to regain her strength. She may also get feelings of sadness (postpartum blues or depression), which is a more serious condition that may require medical treatment.  Your partner may decide to breastfeed the baby. This helps with bonding between the mother and the baby. Breastfeeding is the best way to feed the baby, but you may feel "left out." However, you can feel included by burping the baby and bottle feeding the baby with breast milk (collected by the mother) to give your partner some rest. This also helps you to bond with the baby. Breastfeeding mothers can get pregnant even if they are not having menstrual periods. Therefore, some form of birth control should be used if you do not want to get pregnant. Another question and concern is when it is safe to have sexual intercourse again. Usually it takes 4 to 6 weeks for healing to be over  with. It may take longer after a C-section. If you have any questions about having sexual intercourse or if it is painful, talk to your caregiver. As a father, you will be adjusting your role as the baby grows. Fatherhood is a on-going learning experience. You and your partner should still make time to be together alone and be the couple you were before the baby was born. This is helpful for you, your partner and your baby. As you can see, it is important for a father to be helpful, understanding and supportive during this special time. Document Released: 11/25/2007 Document Revised: 08/31/2011 Document Reviewed: 11/25/2007 Exit[employer] Patient Information 2015 [employer]. This information is not intended to replace advice given to you by your health care provider. Make sure you discuss any questions you have with your health care provider.  

## 2014-03-14 NOTE — Progress Notes (Signed)
Subjective:    Morgan [name] is a 29 y.o. female being seen today for her obstetrical visit. She is at [redacted]w[redacted]d gestation. Patient reports IBS symptoms improving. Fetal movement: normal.  Problem List Items Addressed This Visit     Unprioritized   Supervision of other normal pregnancy - Primary   Relevant Orders      POCT urinalysis dipstick (Completed)     Patient Active Problem List   Diagnosis Date Noted  . IBS (irritable bowel syndrome) 02/11/2014  . Iron deficiency anemia 01/05/2014  . Hepatitis B carrier 12/11/2013  . Supervision of other normal pregnancy 12/11/2013  . Short interval between pregnancies affecting pregnancy, antepartum 12/11/2013  . Obesity (BMI 35.0-39.9 without comorbidity) 12/11/2013  . Hyperprolactinemia 06/01/2013   Objective:    BP 109/72  Pulse 83  Temp(Src) 96.4 F (35.8 C)  Wt 94.802 kg (209 lb)  LMP 07/18/2013 FHT:  140 BPM  Uterine Size: size equals dates  Presentation: unsure     Assessment:    Pregnancy @ [redacted]w[redacted]d weeks   Plan:     labs reviewed, problem list updated     Orders Placed This Encounter  Procedures  . POCT urinalysis dipstick   Follow up in 2 Weeks.

## 2014-03-22 ENCOUNTER — Encounter: Payer: Medicaid Other | Admitting: Obstetrics & Gynecology

## 2014-03-29 ENCOUNTER — Encounter: Payer: Self-pay | Admitting: Obstetrics & Gynecology

## 2014-03-29 ENCOUNTER — Ambulatory Visit (INDEPENDENT_AMBULATORY_CARE_PROVIDER_SITE_OTHER): Payer: Medicaid Other | Admitting: Obstetrics & Gynecology

## 2014-03-29 VITALS — BP 115/77 | HR 97 | Temp 97.3°F | Wt 213.0 lb

## 2014-03-29 DIAGNOSIS — Z3483 Encounter for supervision of other normal pregnancy, third trimester: Secondary | ICD-10-CM

## 2014-03-29 LAB — CBC
HCT: 29.2 % — ABNORMAL LOW (ref 36.0–46.0)
Hemoglobin: 9 g/dL — ABNORMAL LOW (ref 12.0–15.0)
MCH: 18.6 pg — ABNORMAL LOW (ref 26.0–34.0)
MCHC: 30.8 g/dL (ref 30.0–36.0)
MCV: 60.2 fL — ABNORMAL LOW (ref 78.0–100.0)
PLATELETS: 183 10*3/uL (ref 150–400)
RBC: 4.85 MIL/uL (ref 3.87–5.11)
RDW: 16.5 % — AB (ref 11.5–15.5)
WBC: 6.4 10*3/uL (ref 4.0–10.5)

## 2014-03-29 LAB — POCT URINALYSIS DIPSTICK
Blood, UA: NEGATIVE
Glucose, UA: NEGATIVE
Ketones, UA: NEGATIVE
LEUKOCYTES UA: NEGATIVE
NITRITE UA: NEGATIVE
Protein, UA: NEGATIVE
Spec Grav, UA: 1.025
pH, UA: 5

## 2014-03-29 NOTE — Progress Notes (Signed)
Subjective:    Morgan [name] is a 28 y.o. female being seen today for her obstetrical visit. She is at 3730w2d gestation. Patient reports no complaints. Fetal movement: normal.  Problem List Items Addressed This Visit     Unprioritized   Supervision of other normal pregnancy - Primary   Relevant Orders      POCT urinalysis dipstick      Strep B DNA probe      CBC     Patient Active Problem List   Diagnosis Date Noted  . IBS (irritable bowel syndrome) 02/11/2014  . Iron deficiency anemia 01/05/2014  . Hepatitis B carrier 12/11/2013  . Supervision of other normal pregnancy 12/11/2013  . Short interval between pregnancies affecting pregnancy, antepartum 12/11/2013  . Obesity (BMI 35.0-39.9 without comorbidity) 12/11/2013  . Hyperprolactinemia 06/01/2013   Objective:    BP 115/77  Pulse 97  Temp(Src) 97.3 F (36.3 C)  Wt 96.616 kg (213 lb)  LMP 07/18/2013 FHT:  140 BPM  Uterine Size: size equals dates  Presentation: unsure     Assessment:    Pregnancy @ 6930w2d weeks   Plan:     labs reviewed, problem list updated     Orders Placed This Encounter  Procedures  . Strep B DNA probe  . CBC  . POCT urinalysis dipstick   Follow up in 1 Week.

## 2014-03-29 NOTE — Patient Instructions (Signed)
Patient information: Group B streptococcus and pregnancy (Beyond the Basics)  Authors Karen M Puopolo, MD, PhD Carol J Baker, MD Section Editors Charles J Lockwood, MD Daniel J Sexton, MD Deputy Editor Vanessa A Barss, MD Disclosures  All topics are updated as new evidence becomes available and our peer review process is complete.  Literature review current through: Feb 2014.  This topic last updated: Dec 21, 2011.  INTRODUCTION - Group B streptococcus (GBS) is a bacterium that can cause serious infections in pregnant women and newborn babies. GBS is one of many types of streptococcal bacteria, sometimes called "strep." This article discusses GBS, its effect on pregnant women and infants, and ways to prevent complications of GBS. More detailed information about GBS is available by subscription. (See "Group B streptococcal infection in pregnant women".) WHAT IS GROUP B STREP INFECTION? - GBS is commonly found in the digestive system and the vagina. In healthy adults, GBS is not harmful and does not cause problems. But in pregnant women and newborn infants, being infected with GBS can cause serious illness. Approximately one in three to four pregnant women in the [place] carries GBS in their gastrointestinal system and/or in their vagina. Carrying GBS is not the same as being infected. Carriers are not sick and do not need treatment during pregnancy. There is no treatment that can stop you from carrying GBS.  Pregnant women who are carriers of GBS infrequently become infected with GBS. GBS can cause urinary tract infections, infection of the amniotic fluid (bag of water), and infection of the uterus after delivery. GBS infections during pregnancy may lead to preterm labor.  Pregnant women who carry GBS can pass on the bacteria to their newborns, and some of those babies become infected with GBS. Newborns who are infected with GBS can develop pneumonia (lung infection), septicemia (blood infection), or  meningitis (infection of the lining of the brain and spinal cord). These complications can be prevented by giving intravenous antibiotics during labor to any woman who is at risk of GBS infection. You are at risk of GBS infection if: You have a urine culture during your current pregnancy showing GBS  You have a vaginal and rectal culture during your current pregnancy showing GBS  You had an infant infected with GBS in the past GROUP B STREP PREVENTION - Most doctors and nurses recommend a urine culture early in your pregnancy to be sure that you do not have a bladder infection without symptoms. If you urine culture shows GBS or other bacteria, you may be treated with an antibiotic. If you have symptoms of urinary infection, such as pain with urination, any time during your pregnancy, a urine culture is done. If GBS grows from the urine culture, it should be treated with an antibiotic, and you should also receive intravenous antibiotics during labor. Expert groups recommend that all pregnant women have a GBS culture at 35 to 37 weeks of pregnancy. The culture is done by swabbing the vagina and rectum. If your GBS culture is positive, you will be given an intravenous antibiotic during labor. If you have preterm labor, the culture is done then and an intravenous antibiotic is given until the baby is born or the labor is stopped by your health care provider. If you have a positive GBS culture and you have an allergy to penicillin, be sure your doctor and nurse are aware of this allergy and tell them what happened with the allergy. If you had only a rash or itching, this   is not a serious allergy, and you can receive a common drug related to the penicillin. If you had a serious allergy (for example, trouble breathing, swelling of your face) you may need an additional test to determine which antibiotic should be used during labor. Being treated with an antibiotic during labor greatly reduces the chance that you or  your newborn will develop infections related to GBS. It is important to note that young infants up to age 3 months can also develop septicemia, meningitis and other serious infections from GBS. Being treated with an antibiotic during labor does not reduce the chance that your baby will develop this later type of infection. There is currently no known way of preventing this later-onset GBS disease. WHERE TO GET MORE INFORMATION - Your healthcare provider is the best source of information for questions and concerns related to your medical problem.  

## 2014-03-30 LAB — STREP B DNA PROBE: STREP GROUP B AG: DETECTED

## 2014-04-05 ENCOUNTER — Ambulatory Visit (INDEPENDENT_AMBULATORY_CARE_PROVIDER_SITE_OTHER): Payer: Medicaid Other | Admitting: Obstetrics & Gynecology

## 2014-04-05 ENCOUNTER — Encounter: Payer: Self-pay | Admitting: Obstetrics & Gynecology

## 2014-04-05 VITALS — BP 117/89 | HR 98 | Temp 98.3°F | Wt 210.0 lb

## 2014-04-05 DIAGNOSIS — Z3483 Encounter for supervision of other normal pregnancy, third trimester: Secondary | ICD-10-CM

## 2014-04-05 DIAGNOSIS — Z22330 Carrier of Group B streptococcus: Secondary | ICD-10-CM

## 2014-04-05 DIAGNOSIS — O99820 Streptococcus B carrier state complicating pregnancy: Secondary | ICD-10-CM | POA: Insufficient documentation

## 2014-04-05 DIAGNOSIS — L02215 Cutaneous abscess of perineum: Secondary | ICD-10-CM

## 2014-04-05 LAB — POCT URINALYSIS DIPSTICK
Bilirubin, UA: NEGATIVE
Blood, UA: NEGATIVE
GLUCOSE UA: NEGATIVE
Ketones, UA: NEGATIVE
LEUKOCYTES UA: NEGATIVE
NITRITE UA: NEGATIVE
Protein, UA: NEGATIVE
Spec Grav, UA: 1.025
UROBILINOGEN UA: NEGATIVE
pH, UA: 5

## 2014-04-05 MED ORDER — AMOXICILLIN-POT CLAVULANATE 875-125 MG PO TABS: 1.0000 | ORAL_TABLET | Freq: Two times a day (BID) | ORAL | Status: DC

## 2014-04-06 NOTE — Patient Instructions (Signed)

## 2014-04-06 NOTE — Progress Notes (Signed)
Subjective:    Morgan [name] is a 28 y.o. female being seen today for her obstetrical visit. She is at 486w2d gestation. Skin abscess near right buttock for several days--draining purulent fluid.  Fetal movement: normal.  Problem List Items Addressed This Visit     Unprioritized   Supervision of other normal pregnancy - Primary   Relevant Orders      POCT urinalysis dipstick (Completed)   GBS (group B Streptococcus carrier), +RV culture, currently pregnant    Other Visit Diagnoses   Rectal abscess        Relevant Medications       amoxicillin-clavulanate (AUGMENTIN) 875-125 MG per tablet      Patient Active Problem List   Diagnosis Date Noted  . GBS (group B Streptococcus carrier), +RV culture, currently pregnant 04/05/2014  . IBS (irritable bowel syndrome) 02/11/2014  . Iron deficiency anemia 01/05/2014  . Hepatitis B carrier 12/11/2013  . Supervision of other normal pregnancy 12/11/2013  . Short interval between pregnancies affecting pregnancy, antepartum 12/11/2013  . Obesity (BMI 35.0-39.9 without comorbidity) 12/11/2013  . Hyperprolactinemia 06/01/2013    Objective:    BP 117/89  Pulse 98  Temp(Src) 98.3 F (36.8 C)  Wt 95.255 kg (210 lb)  LMP 07/18/2013 FHT: 140 BPM  Uterine Size: size equals dates  Presentations: cephalic  Right-sided, buttock abscess--2 cm; draining copious purulent fluid Assessment:    Pregnancy @ 8186w2d weeks  Skin abscess--spontaneous drainage  Plan:   Keep area clean Meds ordered this encounter  Medications  . amoxicillin-clavulanate (AUGMENTIN) 875-125 MG per tablet    Sig: Take 1 tablet by mouth 2 (two) times daily. 1 tablet po BID x10 days    Dispense:  20 tablet    Refill:  0  Return to MAU in 2 days  Follow up in a few days.

## 2014-04-09 ENCOUNTER — Encounter: Payer: Self-pay | Admitting: Obstetrics & Gynecology

## 2014-04-09 ENCOUNTER — Ambulatory Visit (INDEPENDENT_AMBULATORY_CARE_PROVIDER_SITE_OTHER): Payer: Medicaid Other | Admitting: Obstetrics & Gynecology

## 2014-04-09 VITALS — BP 113/77 | HR 91 | Temp 98.3°F | Wt 210.0 lb

## 2014-04-09 DIAGNOSIS — Z3483 Encounter for supervision of other normal pregnancy, third trimester: Secondary | ICD-10-CM

## 2014-04-09 LAB — POCT URINALYSIS DIPSTICK
Bilirubin, UA: NEGATIVE
Blood, UA: NEGATIVE
GLUCOSE UA: NEGATIVE
Ketones, UA: NEGATIVE
NITRITE UA: NEGATIVE
Urobilinogen, UA: NEGATIVE
pH, UA: 7

## 2014-04-10 NOTE — Patient Instructions (Signed)
Third Trimester of Pregnancy The third trimester is from week 29 through week 42, months 7 through 9. The third trimester is a time when the fetus is growing rapidly. At the end of the ninth month, the fetus is about 20 inches in length and weighs 6-10 pounds.  BODY CHANGES Your body goes through many changes during pregnancy. The changes vary from woman to woman.   Your weight will continue to increase. You can expect to gain 25-35 pounds (11-16 kg) by the end of the pregnancy.  You may begin to get stretch marks on your hips, abdomen, and breasts.  You may urinate more often because the fetus is moving lower into your pelvis and pressing on your bladder.  You may develop or continue to have heartburn as a result of your pregnancy.  You may develop constipation because certain hormones are causing the muscles that push waste through your intestines to slow down.  You may develop hemorrhoids or swollen, bulging veins (varicose veins).  You may have pelvic pain because of the weight gain and pregnancy hormones relaxing your joints between the bones in your pelvis. Backaches may result from overexertion of the muscles supporting your posture.  You may have changes in your hair. These can include thickening of your hair, rapid growth, and changes in texture. Some women also have hair loss during or after pregnancy, or hair that feels dry or thin. Your hair will most likely return to normal after your baby is born.  Your breasts will continue to grow and be tender. A yellow discharge may leak from your breasts called colostrum.  Your belly button may stick out.  You may feel short of breath because of your expanding uterus.  You may notice the fetus "dropping," or moving lower in your abdomen.  You may have a bloody mucus discharge. This usually occurs a few days to a week before labor begins.  Your cervix becomes thin and soft (effaced) near your due date. WHAT TO EXPECT AT YOUR PRENATAL  EXAMS  You will have prenatal exams every 2 weeks until week 36. Then, you will have weekly prenatal exams. During a routine prenatal visit:  You will be weighed to make sure you and the fetus are growing normally.  Your blood pressure is taken.  Your abdomen will be measured to track your baby's growth.  The fetal heartbeat will be listened to.  Any test results from the previous visit will be discussed.  You may have a cervical check near your due date to see if you have effaced. At around 36 weeks, your caregiver will check your cervix. At the same time, your caregiver will also perform a test on the secretions of the vaginal tissue. This test is to determine if a type of bacteria, Group B streptococcus, is present. Your caregiver will explain this further. Your caregiver may ask you:  What your birth plan is.  How you are feeling.  If you are feeling the baby move.  If you have had any abnormal symptoms, such as leaking fluid, bleeding, severe headaches, or abdominal cramping.  If you have any questions. Other tests or screenings that may be performed during your third trimester include:  Blood tests that check for low iron levels (anemia).  Fetal testing to check the health, activity level, and growth of the fetus. Testing is done if you have certain medical conditions or if there are problems during the pregnancy. FALSE LABOR You may feel small, irregular contractions that   eventually go away. These are called Braxton Hicks contractions, or false labor. Contractions may last for hours, days, or even weeks before true labor sets in. If contractions come at regular intervals, intensify, or become painful, it is best to be seen by your caregiver.  SIGNS OF LABOR   Menstrual-like cramps.  Contractions that are 5 minutes apart or less.  Contractions that start on the top of the uterus and spread down to the lower abdomen and back.  A sense of increased pelvic pressure or back  pain.  A watery or bloody mucus discharge that comes from the vagina. If you have any of these signs before the 37th week of pregnancy, call your caregiver right away. You need to go to the hospital to get checked immediately. HOME CARE INSTRUCTIONS   Avoid all smoking, herbs, alcohol, and unprescribed drugs. These chemicals affect the formation and growth of the baby.  Follow your caregiver's instructions regarding medicine use. There are medicines that are either safe or unsafe to take during pregnancy.  Exercise only as directed by your caregiver. Experiencing uterine cramps is a good sign to stop exercising.  Continue to eat regular, healthy meals.  Wear a good support bra for breast tenderness.  Do not use hot tubs, steam rooms, or saunas.  Wear your seat belt at all times when driving.  Avoid raw meat, uncooked cheese, cat litter boxes, and soil used by cats. These carry germs that can cause birth defects in the baby.  Take your prenatal vitamins.  Try taking a stool softener (if your caregiver approves) if you develop constipation. Eat more high-fiber foods, such as fresh vegetables or fruit and whole grains. Drink plenty of fluids to keep your urine clear or pale yellow.  Take warm sitz baths to soothe any pain or discomfort caused by hemorrhoids. Use hemorrhoid cream if your caregiver approves.  If you develop varicose veins, wear support hose. Elevate your feet for 15 minutes, 3-4 times a day. Limit salt in your diet.  Avoid heavy lifting, wear low heal shoes, and practice good posture.  Rest a lot with your legs elevated if you have leg cramps or low back pain.  Visit your dentist if you have not gone during your pregnancy. Use a soft toothbrush to brush your teeth and be gentle when you floss.  A sexual relationship may be continued unless your caregiver directs you otherwise.  Do not travel far distances unless it is absolutely necessary and only with the approval  of your caregiver.  Take prenatal classes to understand, practice, and ask questions about the labor and delivery.  Make a trial run to the hospital.  Pack your hospital bag.  Prepare the baby's nursery.  Continue to go to all your prenatal visits as directed by your caregiver. SEEK MEDICAL CARE IF:  You are unsure if you are in labor or if your water has broken.  You have dizziness.  You have mild pelvic cramps, pelvic pressure, or nagging pain in your abdominal area.  You have persistent nausea, vomiting, or diarrhea.  You have a bad smelling vaginal discharge.  You have pain with urination. SEEK IMMEDIATE MEDICAL CARE IF:   You have a fever.  You are leaking fluid from your vagina.  You have spotting or bleeding from your vagina.  You have severe abdominal cramping or pain.  You have rapid weight loss or gain.  You have shortness of breath with chest pain.  You notice sudden or extreme swelling   of your face, hands, ankles, feet, or legs.  You have not felt your baby move in over an hour.  You have severe headaches that do not go away with medicine.  You have vision changes. Document Released: 06/02/2001 Document Revised: 06/13/2013 Document Reviewed: 08/09/2012 [employer] Patient Information 2015 [employer]. This information is not intended to replace advice given to you by your health care provider. Make sure you discuss any questions you have with your health care provider.  

## 2014-04-10 NOTE — Progress Notes (Signed)
Subjective:    Morgan [name] is a 28 y.o. female being seen today for her obstetrical visit. She is at 4416w2d gestation. Skin abscess healed/no drainage.  Fetal movement normal.  Problem List Items Addressed This Visit     Unprioritized   Supervision of other normal pregnancy - Primary   Relevant Orders      POCT urinalysis dipstick (Completed)     Patient Active Problem List   Diagnosis Date Noted  . GBS (group B Streptococcus carrier), +RV culture, currently pregnant 04/05/2014  . IBS (irritable bowel syndrome) 02/11/2014  . Iron deficiency anemia 01/05/2014  . Hepatitis B carrier 12/11/2013  . Supervision of other normal pregnancy 12/11/2013  . Short interval between pregnancies affecting pregnancy, antepartum 12/11/2013  . Obesity (BMI 35.0-39.9 without comorbidity) 12/11/2013  . Hyperprolactinemia 06/01/2013    Objective:    BP 113/77  Pulse 91  Temp(Src) 98.3 F (36.8 C)  Wt 95.255 kg (210 lb)  LMP 07/18/2013 FHT: 130 BPM  Uterine Size: size equals dates  Presentations: cephalic  Right-sided, buttock abscess resolved Assessment:    Pregnancy @ 2873w6d weeks  Skin abscess resolved  Plan:    Follow up in 1 week

## 2014-04-11 ENCOUNTER — Inpatient Hospital Stay (HOSPITAL_COMMUNITY)
Admission: AD | Admit: 2014-04-11 | Discharge: 2014-04-14 | DRG: 775 | Disposition: A | Discharge status: Home / Self Care | Payer: Medicaid Other | Source: Ambulatory Visit | Attending: Obstetrics | Admitting: Obstetrics

## 2014-04-11 ENCOUNTER — Encounter (HOSPITAL_COMMUNITY): Payer: Self-pay | Admitting: *Deleted

## 2014-04-11 DIAGNOSIS — D649 Anemia, unspecified: Secondary | ICD-10-CM | POA: Diagnosis present

## 2014-04-11 DIAGNOSIS — Z3490 Encounter for supervision of normal pregnancy, unspecified, unspecified trimester: Secondary | ICD-10-CM

## 2014-04-11 DIAGNOSIS — Z3A38 38 weeks gestation of pregnancy: Secondary | ICD-10-CM

## 2014-04-11 DIAGNOSIS — Z3493 Encounter for supervision of normal pregnancy, unspecified, third trimester: Secondary | ICD-10-CM

## 2014-04-11 DIAGNOSIS — O4290 Premature rupture of membranes, unspecified as to length of time between rupture and onset of labor, unspecified weeks of gestation: Secondary | ICD-10-CM | POA: Diagnosis present

## 2014-04-11 DIAGNOSIS — O99214 Obesity complicating childbirth: Secondary | ICD-10-CM | POA: Diagnosis present

## 2014-04-11 DIAGNOSIS — Z6838 Body mass index (BMI) 38.0-38.9, adult: Secondary | ICD-10-CM

## 2014-04-11 DIAGNOSIS — O4292 Full-term premature rupture of membranes, unspecified as to length of time between rupture and onset of labor: Principal | ICD-10-CM | POA: Diagnosis present

## 2014-04-11 DIAGNOSIS — O9902 Anemia complicating childbirth: Secondary | ICD-10-CM | POA: Diagnosis present

## 2014-04-11 DIAGNOSIS — O99824 Streptococcus B carrier state complicating childbirth: Secondary | ICD-10-CM | POA: Diagnosis present

## 2014-04-11 DIAGNOSIS — Z3689 Encounter for other specified antenatal screening: Secondary | ICD-10-CM

## 2014-04-11 HISTORY — DX: Inflammatory liver disease, unspecified: K75.9

## 2014-04-11 LAB — POCT FERN TEST: POCT FERN TEST: POSITIVE

## 2014-04-11 MED ORDER — ACETAMINOPHEN 500 MG PO TABS
1000.0000 mg | ORAL_TABLET | Freq: Four times a day (QID) | ORAL | Status: DC | PRN
  Administered 2014-04-12: 1000 mg via ORAL
  Filled 2014-04-11: qty 2

## 2014-04-11 NOTE — MAU Provider Note (Signed)
S: Morgan [name] is a 28 y.o. G2P0101 at 3848w2d who presents today with leaking of fluid. She denies any VB. She confirms fetal movement. O: VSS, afebrile Abdomen: soft, non-tender, gravid External: no lesion Vagina: pooling of clear fluid Cervix: pink, smooth Uterus: AGA FHT: category I Toco: irregular Results for orders placed during the hospital encounter of 04/11/14 (from the past 24 hour(s))  POCT FERN TEST     Status: Abnormal   Collection Time    04/11/14 11:26 PM      Result Value Ref Range   POCT Fern Test Positive = ruptured amniotic membanes     A/P: Exam for ROM RN will report to attending MD

## 2014-04-11 NOTE — MAU Note (Signed)
Pt states that she thinks her water broke around 12pm today. She did not have a big gush, but she has soaked through 2 pads today so far. Denies vb or contractions. +FM.

## 2014-04-12 ENCOUNTER — Encounter (HOSPITAL_COMMUNITY): Payer: Self-pay

## 2014-04-12 ENCOUNTER — Inpatient Hospital Stay (HOSPITAL_COMMUNITY): Payer: Medicaid Other | Admitting: Anesthesiology

## 2014-04-12 ENCOUNTER — Encounter (HOSPITAL_COMMUNITY): Payer: Medicaid Other | Admitting: Anesthesiology

## 2014-04-12 ENCOUNTER — Inpatient Hospital Stay (HOSPITAL_COMMUNITY): Payer: Medicaid Other

## 2014-04-12 DIAGNOSIS — O4292 Full-term premature rupture of membranes, unspecified as to length of time between rupture and onset of labor: Secondary | ICD-10-CM | POA: Diagnosis present

## 2014-04-12 DIAGNOSIS — O4290 Premature rupture of membranes, unspecified as to length of time between rupture and onset of labor, unspecified weeks of gestation: Secondary | ICD-10-CM | POA: Diagnosis present

## 2014-04-12 DIAGNOSIS — Z3A38 38 weeks gestation of pregnancy: Secondary | ICD-10-CM

## 2014-04-12 DIAGNOSIS — O9902 Anemia complicating childbirth: Secondary | ICD-10-CM | POA: Diagnosis present

## 2014-04-12 DIAGNOSIS — D649 Anemia, unspecified: Secondary | ICD-10-CM | POA: Diagnosis present

## 2014-04-12 DIAGNOSIS — Z6838 Body mass index (BMI) 38.0-38.9, adult: Secondary | ICD-10-CM | POA: Diagnosis not present

## 2014-04-12 DIAGNOSIS — O99214 Obesity complicating childbirth: Secondary | ICD-10-CM | POA: Diagnosis present

## 2014-04-12 DIAGNOSIS — O4192X9 Disorder of amniotic fluid and membranes, unspecified, second trimester, other fetus: Secondary | ICD-10-CM

## 2014-04-12 DIAGNOSIS — O99824 Streptococcus B carrier state complicating childbirth: Secondary | ICD-10-CM

## 2014-04-12 LAB — CBC
HEMATOCRIT: 30.5 % — AB (ref 36.0–46.0)
Hemoglobin: 9 g/dL — ABNORMAL LOW (ref 12.0–15.0)
MCH: 18.3 pg — ABNORMAL LOW (ref 26.0–34.0)
MCHC: 29.5 g/dL — ABNORMAL LOW (ref 30.0–36.0)
MCV: 62 fL — ABNORMAL LOW (ref 78.0–100.0)
Platelets: 179 10*3/uL (ref 150–400)
RBC: 4.92 MIL/uL (ref 3.87–5.11)
RDW: 16.5 % — AB (ref 11.5–15.5)
WBC: 5.6 10*3/uL (ref 4.0–10.5)

## 2014-04-12 LAB — RAPID HIV SCREEN (WH-MAU): Rapid HIV Screen: NONREACTIVE

## 2014-04-12 LAB — RPR

## 2014-04-12 LAB — TYPE AND SCREEN
ABO/RH(D): O POS
Antibody Screen: NEGATIVE

## 2014-04-12 MED ORDER — OXYTOCIN 40 UNITS IN LACTATED RINGERS INFUSION - SIMPLE MED: 62.5000 mL/h | INTRAVENOUS | Status: DC

## 2014-04-12 MED ORDER — ONDANSETRON HCL 4 MG/2ML IJ SOLN: 4.0000 mg | INTRAMUSCULAR | Status: DC | PRN

## 2014-04-12 MED ORDER — LIDOCAINE HCL (PF) 1 % IJ SOLN
INTRAMUSCULAR | Status: DC | PRN
  Administered 2014-04-12 (×4): 4 mL

## 2014-04-12 MED ORDER — FLEET ENEMA 7-19 GM/118ML RE ENEM: 1.0000 | ENEMA | RECTAL | Status: DC | PRN

## 2014-04-12 MED ORDER — OXYCODONE-ACETAMINOPHEN 5-325 MG PO TABS: 2.0000 | ORAL_TABLET | ORAL | Status: DC | PRN

## 2014-04-12 MED ORDER — OXYCODONE-ACETAMINOPHEN 5-325 MG PO TABS: 1.0000 | ORAL_TABLET | ORAL | Status: DC | PRN

## 2014-04-12 MED ORDER — CITRIC ACID-SODIUM CITRATE 334-500 MG/5ML PO SOLN: 30.0000 mL | ORAL | Status: DC | PRN

## 2014-04-12 MED ORDER — ONDANSETRON HCL 4 MG/2ML IJ SOLN: 4.0000 mg | Freq: Four times a day (QID) | INTRAMUSCULAR | Status: DC | PRN

## 2014-04-12 MED ORDER — ONDANSETRON HCL 4 MG PO TABS: 4.0000 mg | ORAL_TABLET | ORAL | Status: DC | PRN

## 2014-04-12 MED ORDER — IBUPROFEN 600 MG PO TABS
600.0000 mg | ORAL_TABLET | Freq: Four times a day (QID) | ORAL | Status: DC
  Administered 2014-04-12 – 2014-04-14 (×7): 600 mg via ORAL
  Filled 2014-04-12 (×7): qty 1

## 2014-04-12 MED ORDER — FENTANYL 2.5 MCG/ML BUPIVACAINE 1/10 % EPIDURAL INFUSION (WH - ANES)
14.0000 mL/h | INTRAMUSCULAR | Status: DC | PRN
  Administered 2014-04-12: 14 mL/h via EPIDURAL
  Filled 2014-04-12: qty 125

## 2014-04-12 MED ORDER — PENICILLIN G POTASSIUM 5000000 UNITS IJ SOLR
5.0000 10*6.[IU] | Freq: Once | INTRAVENOUS | Status: AC
  Administered 2014-04-12: 5 10*6.[IU] via INTRAVENOUS
  Filled 2014-04-12: qty 5

## 2014-04-12 MED ORDER — DIBUCAINE 1 % RE OINT: 1.0000 "application " | TOPICAL_OINTMENT | RECTAL | Status: DC | PRN

## 2014-04-12 MED ORDER — OXYCODONE-ACETAMINOPHEN 5-325 MG PO TABS
1.0000 | ORAL_TABLET | ORAL | Status: DC | PRN
  Administered 2014-04-13 (×3): 1 via ORAL

## 2014-04-12 MED ORDER — EPHEDRINE 5 MG/ML INJ
10.0000 mg | INTRAVENOUS | Status: DC | PRN
  Filled 2014-04-12: qty 2

## 2014-04-12 MED ORDER — LIDOCAINE HCL (PF) 1 % IJ SOLN
30.0000 mL | INTRAMUSCULAR | Status: AC | PRN
  Administered 2014-04-12 (×2): 8 mL via SUBCUTANEOUS

## 2014-04-12 MED ORDER — LACTATED RINGERS IV SOLN: 500.0000 mL | INTRAVENOUS | Status: DC | PRN

## 2014-04-12 MED ORDER — LACTATED RINGERS IV SOLN
INTRAVENOUS | Status: DC
  Administered 2014-04-12 (×4): via INTRAVENOUS

## 2014-04-12 MED ORDER — OXYTOCIN 40 UNITS IN LACTATED RINGERS INFUSION - SIMPLE MED
1.0000 m[IU]/min | INTRAVENOUS | Status: DC
Start: 2014-04-12 — End: 2014-04-12
  Administered 2014-04-12: 2 m[IU]/min via INTRAVENOUS
  Filled 2014-04-12: qty 1000

## 2014-04-12 MED ORDER — DIPHENHYDRAMINE HCL 25 MG PO CAPS: 25.0000 mg | ORAL_CAPSULE | Freq: Four times a day (QID) | ORAL | Status: DC | PRN

## 2014-04-12 MED ORDER — PENICILLIN G POTASSIUM 5000000 UNITS IJ SOLR
2.5000 10*6.[IU] | INTRAVENOUS | Status: DC
  Administered 2014-04-12 (×4): 2.5 10*6.[IU] via INTRAVENOUS
  Filled 2014-04-12 (×9): qty 2.5

## 2014-04-12 MED ORDER — WITCH HAZEL-GLYCERIN EX PADS: 1.0000 "application " | MEDICATED_PAD | CUTANEOUS | Status: DC | PRN

## 2014-04-12 MED ORDER — PHENYLEPHRINE 40 MCG/ML (10ML) SYRINGE FOR IV PUSH (FOR BLOOD PRESSURE SUPPORT)
80.0000 ug | PREFILLED_SYRINGE | INTRAVENOUS | Status: DC | PRN
  Filled 2014-04-12: qty 2
  Filled 2014-04-12: qty 10

## 2014-04-12 MED ORDER — BENZOCAINE-MENTHOL 20-0.5 % EX AERO: 1.0000 "application " | INHALATION_SPRAY | CUTANEOUS | Status: DC | PRN

## 2014-04-12 MED ORDER — TETANUS-DIPHTH-ACELL PERTUSSIS 5-2.5-18.5 LF-MCG/0.5 IM SUSP
0.5000 mL | Freq: Once | INTRAMUSCULAR | Status: DC
Start: 2014-04-13 — End: 2014-04-14

## 2014-04-12 MED ORDER — DIPHENHYDRAMINE HCL 50 MG/ML IJ SOLN: 12.5000 mg | INTRAMUSCULAR | Status: DC | PRN

## 2014-04-12 MED ORDER — BUTORPHANOL TARTRATE 1 MG/ML IJ SOLN
2.0000 mg | INTRAMUSCULAR | Status: DC | PRN
  Administered 2014-04-12: 2 mg via INTRAVENOUS
  Filled 2014-04-12 (×2): qty 2

## 2014-04-12 MED ORDER — PHENYLEPHRINE 40 MCG/ML (10ML) SYRINGE FOR IV PUSH (FOR BLOOD PRESSURE SUPPORT)
80.0000 ug | PREFILLED_SYRINGE | INTRAVENOUS | Status: DC | PRN
  Filled 2014-04-12: qty 2

## 2014-04-12 MED ORDER — LIDOCAINE HCL (PF) 1 % IJ SOLN: 30.0000 mL | INTRAMUSCULAR | Status: DC | PRN

## 2014-04-12 MED ORDER — OXYCODONE-ACETAMINOPHEN 5-325 MG PO TABS
1.0000 | ORAL_TABLET | ORAL | Status: DC | PRN
  Administered 2014-04-12 – 2014-04-14 (×2): 1 via ORAL
  Filled 2014-04-12 (×5): qty 1

## 2014-04-12 MED ORDER — LIDOCAINE-EPINEPHRINE (PF) 2 %-1:200000 IJ SOLN
INTRAMUSCULAR | Status: DC | PRN
  Administered 2014-04-12 (×2): 5 mL via INTRADERMAL

## 2014-04-12 MED ORDER — PRENATAL MULTIVITAMIN CH
1.0000 | ORAL_TABLET | Freq: Every day | ORAL | Status: DC
  Administered 2014-04-13 – 2014-04-14 (×2): 1 via ORAL
  Filled 2014-04-12 (×2): qty 1

## 2014-04-12 MED ORDER — OXYCODONE-ACETAMINOPHEN 5-325 MG PO TABS
2.0000 | ORAL_TABLET | ORAL | Status: DC | PRN
  Administered 2014-04-14: 2 via ORAL
  Filled 2014-04-12: qty 2

## 2014-04-12 MED ORDER — FERROUS SULFATE 325 (65 FE) MG PO TABS
325.0000 mg | ORAL_TABLET | Freq: Two times a day (BID) | ORAL | Status: DC
  Administered 2014-04-13 – 2014-04-14 (×4): 325 mg via ORAL
  Filled 2014-04-12 (×4): qty 1

## 2014-04-12 MED ORDER — LANOLIN HYDROUS EX OINT: TOPICAL_OINTMENT | CUTANEOUS | Status: DC | PRN

## 2014-04-12 MED ORDER — FENTANYL 2.5 MCG/ML BUPIVACAINE 1/10 % EPIDURAL INFUSION (WH - ANES)
INTRAMUSCULAR | Status: DC | PRN
  Administered 2014-04-12 (×2): 14 mL/h via EPIDURAL

## 2014-04-12 MED ORDER — ZOLPIDEM TARTRATE 5 MG PO TABS: 5.0000 mg | ORAL_TABLET | Freq: Every evening | ORAL | Status: DC | PRN

## 2014-04-12 MED ORDER — LACTATED RINGERS IV SOLN
500.0000 mL | Freq: Once | INTRAVENOUS | Status: AC
  Administered 2014-04-12: 500 mL via INTRAVENOUS

## 2014-04-12 MED ORDER — OXYTOCIN BOLUS FROM INFUSION: 500.0000 mL | INTRAVENOUS | Status: DC

## 2014-04-12 MED ORDER — TERBUTALINE SULFATE 1 MG/ML IJ SOLN: 0.2500 mg | Freq: Once | INTRAMUSCULAR | Status: DC | PRN

## 2014-04-12 MED ORDER — ACETAMINOPHEN 325 MG PO TABS: 650.0000 mg | ORAL_TABLET | ORAL | Status: DC | PRN

## 2014-04-12 MED ORDER — LIDOCAINE HCL (PF) 1 % IJ SOLN
INTRAMUSCULAR | Status: AC
  Filled 2014-04-12: qty 30

## 2014-04-12 MED ORDER — SIMETHICONE 80 MG PO CHEW: 80.0000 mg | CHEWABLE_TABLET | ORAL | Status: DC | PRN

## 2014-04-12 MED ORDER — BUPIVACAINE HCL (PF) 0.25 % IJ SOLN
INTRAMUSCULAR | Status: DC | PRN
  Administered 2014-04-12: 5 mL via EPIDURAL

## 2014-04-12 MED ORDER — SENNOSIDES-DOCUSATE SODIUM 8.6-50 MG PO TABS
2.0000 | ORAL_TABLET | ORAL | Status: DC
  Administered 2014-04-12 – 2014-04-14 (×2): 2 via ORAL
  Filled 2014-04-12 (×2): qty 2

## 2014-04-12 NOTE — Progress Notes (Signed)
Morgan [name] is [name] 28 y.o. G2P0101 at 4052w2d by LMP admitted for rupture of membranes  Subjective:   Objective: BP 101/75  Pulse 77  Temp(Src) 98.3 F (36.8 C) (Oral)  Resp 18  Ht 5\' 2"  (1.575 m)  Wt 210 lb (95.255 kg)  BMI 38.40 kg/m2  SpO2 100%  LMP 07/18/2013      FHT:  FHR: 120-130 bpm, variability: moderate,  accelerations:  Present,  decelerations:  Absent UC:   regular, every 4-5 minutes SVE:   Dilation: 3 Effacement (%): 50 Station: Ballotable Exam by::  (confirmed by Bedside US)  Labs: Lab Results  Component Value Date   WBC 5.6 04/12/2014   HGB 9.0* 04/12/2014   HCT 30.5* 04/12/2014   MCV 62.0* 04/12/2014   PLT 179 04/12/2014    Assessment / Plan: 38 weeks.  PROM.  On low dose pitocin per protocol.  Labor: Latent phase Preeclampsia:  n/[name] Fetal Wellbeing:  Category I Pain Control:  Epidural I/D:  n/[name] Anticipated MOD:  NSVD  Morgan [name] 04/12/2014, 2:42 PM

## 2014-04-12 NOTE — Anesthesia Procedure Notes (Addendum)
Epidural Patient location during procedure: OB Start time: 04/12/2014 3:08 PM End time: 04/12/2014 3:12 PM  Staffing Anesthesiologist: Leilani AbleHATCHETT, FRANKLIN Performed by: anesthesiologist   Preanesthetic Checklist Completed: patient identified, surgical consent, pre-op evaluation, timeout performed, IV checked, risks and benefits discussed and monitors and equipment checked  Epidural Patient position: sitting Prep: site prepped and draped and DuraPrep Patient monitoring: continuous pulse ox and blood pressure Approach: midline Location: L3-L4 Injection technique: LOR air  Needle:  Needle type: Tuohy  Needle gauge: 17 G Needle length: 9 cm and 9 Needle insertion depth: 7 cm Catheter type: closed end flexible Catheter size: 19 Gauge Catheter at skin depth: 12 cm Test dose: negative and Other  Assessment Sensory level: T9 Events: blood not aspirated, injection not painful, no injection resistance, negative IV test and no paresthesia  Additional Notes Reason for block:procedure for pain  Epidural Patient location during procedure: OB Start time: 04/12/2014 5:18 PM End time: 04/12/2014 5:22 PM  Staffing Anesthesiologist: Leilani AbleHATCHETT, FRANKLIN Performed by: anesthesiologist   Preanesthetic Checklist Completed: patient identified, surgical consent, pre-op evaluation, timeout performed, IV checked, risks and benefits discussed and monitors and equipment checked  Epidural Patient position: sitting Prep: site prepped and draped and DuraPrep Patient monitoring: continuous pulse ox and blood pressure Approach: midline Location: L3-L4 Injection technique: LOR air  Needle:  Needle type: Tuohy  Needle gauge: 17 G Needle length: 9 cm and 9 Needle insertion depth: 8 cm Catheter type: closed end flexible Catheter size: 19 Gauge Catheter at skin depth: 12 cm Test dose: negative and Other  Assessment Sensory level: T9 Events: blood not aspirated, injection not painful, no  injection resistance, negative IV test and no paresthesia  Additional Notes Prior catheter removed after being pulled back 1cm redosed and pt still had persistent one sided block on R.Reason for block:procedure for pain  Epidural Patient location during procedure: OB Start time: 04/12/2014 7:52 PM  Staffing Anesthesiologist: Malavika Lira Performed by: anesthesiologist   Preanesthetic Checklist Completed: patient identified, site marked, surgical consent, pre-op evaluation, timeout performed, IV checked, risks and benefits discussed and monitors and equipment checked  Epidural Patient position: sitting Prep: site prepped and draped and DuraPrep Patient monitoring: continuous pulse ox and blood pressure Approach: midline Injection technique: LOR air  Needle:  Needle type: Tuohy  Needle gauge: 17 G Needle length: 9 cm and 9 Needle insertion depth: 6 cm Catheter type: closed end flexible Catheter size: 19 Gauge Catheter at skin depth: 11 cm Test dose: negative  Assessment Events: blood not aspirated, injection not painful, no injection resistance, negative IV test and no paresthesia  Additional Notes Discussed risk of headache, infection, bleeding, nerve injury and failed or incomplete block.  Patient voices understanding and wishes to proceed.  Epidural placed easily on first attempt.  No paresthesia.  Patient tolerated procedure well with no apparent complications.  Jasmine DecemberA. Kathlee Barnhardt, MDReason for block:procedure for pain

## 2014-04-12 NOTE — Plan of Care (Signed)
Problem: Phase I Progression Outcomes Goal: Induction meds as ordered Outcome: Progressing Difficulty to determine presenting part during SVE. Obtained order for Bedside US from provider. Will plan to begin augmentation with pitocin pending results.

## 2014-04-12 NOTE — Progress Notes (Signed)
Patient ID: Morgan FootmanIreen M Hago, female   DOB: 08/17/1985, 28 y.o.   MRN: [id number] Nurse was asked to let me know when pt was 7cm dilated but i was called when she was fully dilated so when i got here pt had already delivered i delivered placenta intact

## 2014-04-12 NOTE — H&P (Signed)
Morgan [name] Hago is a 28 y.o. female presenting with SROM. Maternal Medical History:  Reason for admission: Rupture of membranes.   Contractions: Onset was 3-5 hours ago.    Fetal activity: Perceived fetal activity is normal.    Prenatal complications: no prenatal complications Prenatal Complications - Diabetes: none.    OB History   Grav Para Term Preterm Abortions TAB SAB Ect Mult Living   2 1  1      1      Past Medical History  Diagnosis Date  . Complication of anesthesia     Patient states when given the epidural the numbness went up to her nose.   Marland Kitchen. Hepatitis B carrier    History reviewed. No pertinent past surgical history. Family History: family history is negative for Alcohol abuse, Arthritis, Asthma, Birth defects, Cancer, COPD, Depression, Diabetes, Drug abuse, Early death, Hearing loss, Heart disease, Hyperlipidemia, Hypertension, Kidney disease, Learning disabilities, Mental illness, Mental retardation, Miscarriages / Stillbirths, Stroke, and Vision loss. Social History:  reports that she has never smoked. She has never used smokeless tobacco. She reports that she does not drink alcohol or use illicit drugs.    Review of Systems  Constitutional: Negative for fever.  Eyes: Negative for blurred vision.  Respiratory: Negative for shortness of breath.   Gastrointestinal: Negative for vomiting.  Skin: Negative for rash.  Neurological: Negative for headaches.    Dilation: 3 Effacement (%): 50 Station: -2 Exam by:: K Henson, RNC Blood pressure 112/74, pulse 82, temperature 98 F (36.7 C), temperature source Oral, resp. rate 18, height 5\' 2"  (1.575 m), weight 95.255 kg (210 lb), last menstrual period 07/18/2013, currently breastfeeding. Maternal Exam:  Abdomen: not evaluated.  Introitus: not evaluated.   Cervix: Cervix evaluated by digital exam.     Fetal Exam Fetal Monitor Review: Baseline rate: 130.  Variability: moderate (6-25 bpm).   Pattern: accelerations  present and no decelerations.    Fetal State Assessment: Category I - tracings are normal.     Physical Exam  Constitutional: She appears well-developed.  HENT:  Head: Normocephalic.  Neck: Neck supple. No thyromegaly present.  Cardiovascular: Normal rate and regular rhythm.   Respiratory: Breath sounds normal.  GI: Soft. Bowel sounds are normal.  Skin: No rash noted.    Prenatal labs: ABO, Rh: O/POS/-- (06/22 1702) Antibody: NEG (06/22 1702) Rubella: 3.02 (06/22 1702) RPR: NON REAC (07/15 1151)  HBsAg: POSITIVE (06/22 1702)  HIV: NONREACTIVE (07/15 1151)  GBS: Detected (10/08 1618)   Assessment/Plan: Primipara @ 4446w2d.  PROM.  GBS pos. Category I FHT  Admit Augment labor with low dose Pitocin per protocol PCN GBS prophylaxis   JACKSON-MOORE,Phineas Mcenroe A 04/12/2014, 12:43 AM

## 2014-04-12 NOTE — Anesthesia Preprocedure Evaluation (Signed)
Anesthesia Evaluation  Patient identified by MRN, date of birth, ID band Patient awake    Reviewed: Allergy & Precautions, H&P , NPO status , Patient's Chart, lab work & pertinent test results  Airway Mallampati: II TM Distance: >3 FB Neck ROM: full    Dental no notable dental hx.    Pulmonary neg pulmonary ROS,    Pulmonary exam normal       Cardiovascular negative cardio ROS      Neuro/Psych negative neurological ROS  negative psych ROS   GI/Hepatic negative GI ROS,   Endo/Other  Morbid obesity  Renal/GU negative Renal ROS     Musculoskeletal   Abdominal (+) + obese,   Peds  Hematology   Anesthesia Other Findings   Reproductive/Obstetrics (+) Pregnancy                           Anesthesia Physical Anesthesia Plan  ASA: III  Anesthesia Plan: Epidural   Post-op Pain Management:    Induction:   Airway Management Planned:   Additional Equipment:   Intra-op Plan:   Post-operative Plan:   Informed Consent: I have reviewed the patients History and Physical, chart, labs and discussed the procedure including the risks, benefits and alternatives for the proposed anesthesia with the patient or authorized representative who has indicated his/her understanding and acceptance.     Plan Discussed with:   Anesthesia Plan Comments:         Anesthesia Quick Evaluation

## 2014-04-13 LAB — CBC
HCT: 28.6 % — ABNORMAL LOW (ref 36.0–46.0)
HEMOGLOBIN: 8.4 g/dL — AB (ref 12.0–15.0)
MCH: 18.2 pg — ABNORMAL LOW (ref 26.0–34.0)
MCHC: 29.4 g/dL — AB (ref 30.0–36.0)
MCV: 62 fL — ABNORMAL LOW (ref 78.0–100.0)
Platelets: 184 10*3/uL (ref 150–400)
RBC: 4.61 MIL/uL (ref 3.87–5.11)
RDW: 16.5 % — ABNORMAL HIGH (ref 11.5–15.5)
WBC: 10.1 10*3/uL (ref 4.0–10.5)

## 2014-04-13 NOTE — Lactation Note (Signed)
This note was copied from the chart of Morgan Shelitha [name]. Lactation Consultation Note  Patient Name: Morgan [name] ZOXWR'UToday's Date: 04/13/2014 Reason for consult: Follow-up assessment LC went to assist Mom with latch post frenotomy. Mom resting and baby asleep. Mom declined to BF at this time, she will call with the next feeding.   Maternal Data Formula Feeding for Exclusion: Yes Reason for exclusion: Mother's choice to formula and breast feed on admission Has patient been taught Hand Expression?: Yes  Feeding Feeding Type: Breast Fed Length of feed: 20 min  LATCH Score/Interventions Latch: Grasps breast easily, tongue down, lips flanged, rhythmical sucking.  Audible Swallowing: None  Type of Nipple: Everted at rest and after stimulation  Comfort (Breast/Nipple): Engorged, cracked, bleeding, large blisters, severe discomfort Problem noted: Cracked, bleeding, blisters, bruises Intervention(s): Expressed breast milk to nipple;Other (comment) (comfort gels)     Hold (Positioning): Assistance needed to correctly position infant at breast and maintain latch. Intervention(s): Breastfeeding basics reviewed;Support Pillows;Position options;Skin to skin  LATCH Score: 5  Lactation Tools Discussed/Used Tools: Comfort gels WIC Program: Yes   Consult Status Consult Status: Follow-up Date: 04/13/14 Follow-up type: In-patient    Morgan [name], Morgan [name] 04/13/2014, 1:07 PM

## 2014-04-13 NOTE — Progress Notes (Signed)
UR completed 

## 2014-04-13 NOTE — Lactation Note (Signed)
This note was copied from the chart of Morgan [name]. Lactation Consultation Note  Patient Name: Morgan [name] Reason for consult: Initial assessment Mom is c/o of sore nipples. Baby latched on the right breast when LC arrived and does not have good depth. Mom asked LC to evaluate this baby for "tongue tie" as her 1st baby had tongue tie that was revised at 694 days of age and Mom reports the latch and pain with latch feels like with her 1st baby. LC does note short anterior frenulum with this baby. Advised Mom to have Peds evaluate the frenulum when they see her today. Spoke with Dr. Margo AyeHall regarding Mom's history. Mom also reports low milk supply with her 1st baby. She reports not receiving any breast milk with pumping. She supplemented her 1st baby before d/c from hospital, pumped at home but by 3 months baby transitioned to bottle. Mom does not report breast changes with this pregnancy. Glandular tissue does feel to be present and Mom denies history that would preclude to LMS. Drop of breast milk with hand expression from left nipple. Both nipple are cracked at the base, some blistering at the tip of the nipple. Advised Mom to apply EBM, she is using comfort gels. Assisted Mom with latching baby to obtain wide gape in side lying position. Mom's initial PS was 10 and improved to 7 with baby nursing. LC discussed with Mom that supplementation may be needed with this baby due to her history if weight loss is more than expected or inadequate I/O. Mom is open to this but not ready to supplement or pump today if needed. Basic teaching reviewed. Lactation brochure left for review. Encouraged to ask for assist as needed or for questions/concerns.   Maternal Data Formula Feeding for Exclusion: Yes Reason for exclusion: Mother's choice to formula and breast feed on admission Has patient been taught Hand Expression?: Yes  Feeding Feeding Type: Breast Fed Length of feed: 10 min  LATCH  Score/Interventions Latch: Grasps breast easily, tongue down, lips flanged, rhythmical sucking.  Audible Swallowing: None  Type of Nipple: Everted at rest and after stimulation  Comfort (Breast/Nipple): Engorged, cracked, bleeding, large blisters, severe discomfort Problem noted: Cracked, bleeding, blisters, bruises Intervention(s): Expressed breast milk to nipple;Other (comment) (comfort gels)     Hold (Positioning): Assistance needed to correctly position infant at breast and maintain latch. Intervention(s): Breastfeeding basics reviewed;Support Pillows;Position options;Skin to skin  LATCH Score: 5  Lactation Tools Discussed/Used Tools: Comfort gels WIC Program: Yes   Consult Status Consult Status: Follow-up [name]: 04/14/14 Follow-up type: In-patient    Alfred LevinsGranger, [name], 10:09 AM

## 2014-04-13 NOTE — Progress Notes (Signed)
Patient ID: Morgan [name], female   DOB: 12/13/1985, 28 y.o.   MRN: [id number] Post Partum Day 1 S/P spontaneous vaginal RH status/Rubella reviewed.  Feeding: breast Subjective: No HA, SOB, CP, F/C, breast symptoms. Normal vaginal bleeding, no clots.     Objective: BP 129/63  Pulse 101  Temp(Src) 98.1 F (36.7 C) (Oral)  Resp 20  Ht 5\' 2"  (1.575 m)  Wt 95.255 kg (210 lb)  BMI 38.40 kg/m2  SpO2 98%  LMP 07/18/2013  Breastfeeding? Unknown   Physical Exam:  General: alert Lochia: appropriate Uterine Fundus: firm DVT Evaluation: No evidence of DVT seen on physical exam. Ext: No c/c/e  Recent Labs  04/12/14 0115 04/13/14 0610  HGB 9.0* 8.4*  HCT 30.5* 28.6*      Assessment/Plan: 28 y.o.  PPD #1 .  normal postpartum exam Anemia stable Continue current postpartum care Ambulate   LOS: 2 days   JACKSON-MOORE,Morgan [name] 04/13/2014, 5:56 PM

## 2014-04-13 NOTE — Lactation Note (Signed)
This note was copied from the chart of Morgan [name]. Lactation Consultation Note  Patient Name: Morgan [name] ZOXWR'UToday's Date: 04/13/2014 Reason for consult: Follow-up assessment;Breast/nipple pain Assisted Mom with latching baby post frenotomy. In side lying position assisted Mom to keep bottom lip down with latch. Upper lip tucks, can bring out but will re-tuck off/on. Baby demonstrated a good rhythmic suck, 1-2 swallows noted. Mom reports her pain scale was 8-10 prior to frenotomy, now 5-6 with nursing. Mom does have cracking at the base of both nipples from prior latch with some blisters end of nipple. Care for sore nipples reviewed, advised to apply EBM. Mom has comfort gels but has not used them yet. Nipple was mostly round when baby came off the breast, slight compression line still visible. Some colostrum noted with hand expression both breasts. Mom is considering supplementing due to history, she wants to start pumping. Hand pump given, Mom has used in the past. Mom concerned about output thus far, baby has had 2 voids/2 stools at 16 hours of age. Reassured Mom but advised if she decides to supplement let RN know. Left Mom foley cup to use for supplementing. Encouraged to call for assist as needed or for questions/concerns.   Maternal Data    Feeding Feeding Type: Breast Fed Length of feed: 25 min  LATCH Score/Interventions Latch: Grasps breast easily, tongue down, lips flanged, rhythmical sucking.  Audible Swallowing: A few with stimulation  Type of Nipple: Everted at rest and after stimulation  Comfort (Breast/Nipple): Engorged, cracked, bleeding, large blisters, severe discomfort Problem noted: Cracked, bleeding, blisters, bruises Intervention(s): Expressed breast milk to nipple  Problem noted: Cracked, bleeding, blisters, bruises Interventions  (Cracked/bleeding/bruising/blister): Hand pump  Hold (Positioning): Assistance needed to correctly position infant at breast and  maintain latch. Intervention(s): Breastfeeding basics reviewed;Support Pillows;Position options;Skin to skin  LATCH Score: 6  Lactation Tools Discussed/Used Tools: Pump;Comfort gels;Feeding cup Breast pump type: Double-Electric Breast Pump   Consult Status Consult Status: Follow-up Date: 04/14/14 Follow-up type: In-patient    Alfred LevinsGranger, Kenzey Birkland Ann 04/13/2014, 2:32 PM

## 2014-04-13 NOTE — Anesthesia Postprocedure Evaluation (Signed)
  Anesthesia Post-op Note  Patient: Morgan [name]  Procedure(s) Performed: * No procedures listed *  Patient Location: Mother/Baby  Anesthesia Type:Epidural  Level of Consciousness: awake, alert , oriented and patient cooperative  Airway and Oxygen Therapy: Patient Spontanous Breathing  Post-op Pain: mild  Post-op Assessment: Post-op Vital signs reviewed, Patient's Cardiovascular Status Stable, Respiratory Function Stable, Patent Airway, No headache, No backache, No residual numbness and No residual motor weakness  Post-op Vital Signs: Reviewed and stable  Last Vitals:  Filed Vitals:   04/13/14 0442  BP: 96/67  Pulse: 62  Temp: 36.6 C  Resp: 16    Complications: No apparent anesthesia complications

## 2014-04-14 MED ORDER — FERRALET 90 90-1 MG PO TABS: 1.0000 | ORAL_TABLET | Freq: Every morning | ORAL | Status: DC

## 2014-04-14 NOTE — Lactation Note (Signed)
This note was copied from the chart of Morgan Rosary [name]. Lactation Consultation Note  Mom states baby just finished a feeding.  Mom states she can easily hand express milk but baby is not always opening wide for a deep latch.  Both nipples cracked at base and mom wearing comfort gels.  Baby is also receiving very small amount of formula supplementation when he acts hungry after the feeding.  I left my phone number for mom to call me for assist when baby acts hungry.  Patient Name: Morgan [name] ZHYQM'VToday's Date: 04/14/2014     Maternal Data    Feeding Feeding Type: Bottle Fed - Formula Length of feed: 30 min  LATCH Score/Interventions                      Lactation Tools Discussed/Used     Consult Status      [name], Morgan Seeman S 04/14/2014, 10:55 AM

## 2014-04-14 NOTE — Discharge Instructions (Signed)

## 2014-04-14 NOTE — Progress Notes (Signed)
Patient states she feels much better

## 2014-04-14 NOTE — Progress Notes (Signed)
Notified Dr Tamela OddiJackson Moore of patient feeling dizzy earlier, but had 2 percocet about an hour before. Patient states she feels much better now.

## 2014-04-14 NOTE — Progress Notes (Signed)
Patient called out feeling light headed and dizzy, vitals signs check.

## 2014-04-14 NOTE — Discharge Summary (Signed)
  Obstetric Discharge Summary Reason for Admission: onset of labor Prenatal Procedures: none Intrapartum Procedures: spontaneous vaginal delivery Postpartum Procedures: none Complications-Operative and Postpartum: none  Hemoglobin  Date Value Ref Range Status  04/13/2014 8.4* 12.0 - 15.0 g/dL Final     HCT  Date Value Ref Range Status  04/13/2014 28.6* 36.0 - 46.0 % Final    Physical Exam:  General: alert Lochia: appropriate Uterine: firm Incision: n/a DVT Evaluation: No evidence of DVT seen on physical exam.  Discharge Diagnoses: Active Problems:   Premature rupture of membranes   NVD (normal vaginal delivery)   Discharge Information: Date: 04/14/2014 Activity: pelvic rest Diet: routine Medications:  Prior to Admission medications   Medication Sig Start Date End Date Taking? Authorizing Provider  amoxicillin-clavulanate (AUGMENTIN) 875-125 MG per tablet Take 1 tablet by mouth 2 (two) times daily. 1 tablet po BID x10 days 04/05/14  Yes Antionette CharLisa Jackson-Moore, MD  dicyclomine (BENTYL) 20 MG tablet Take 1 tablet (20 mg total) by mouth 4 (four) times daily - after meals and at bedtime. 02/08/14  Yes Antionette CharLisa Jackson-Moore, MD  ferrous sulfate 325 (65 FE) MG tablet Take 325 mg by mouth daily with breakfast.   Yes Historical Provider, MD  metoCLOPramide (REGLAN) 10 MG tablet Take 1 tablet (10 mg total) by mouth daily. 03/06/13  Yes Hurshel PartyLisa A Leftwich-Kirby, CNM  Prenatal Vit-Fe Fumarate-FA (PRENATAL MULTIVITAMIN) TABS tablet Take 1 tablet by mouth daily at 12 noon.   Yes Historical Provider, MD  Fe Cbn-Fe Gluc-FA-B12-C-DSS (FERRALET 90) 90-1 MG TABS Take 1 tablet by mouth every morning. 04/14/14   Antionette CharLisa Jackson-Moore, MD    Condition: stable Instructions: refer to routine discharge instructions Discharge to: home Follow-up Information   Follow up with Antionette CharJACKSON-MOORE,Emily Massar A, MD. Schedule an appointment as soon as possible for a visit in 2 weeks.   Specialty:  Obstetrics and Gynecology   Contact information:   [address] [place] [id number] [phone]       Newborn Data:  Live born female  Birth Weight: 6 lb 12.8 oz (3085 g) APGAR: 8, 9   Home with mother.  JACKSON-MOORE,Heavenlee Maiorana A 04/14/2014, 11:00 AM

## 2014-04-16 ENCOUNTER — Encounter: Payer: Medicaid Other | Admitting: Obstetrics & Gynecology

## 2014-04-23 ENCOUNTER — Encounter (HOSPITAL_COMMUNITY): Payer: Self-pay

## 2014-04-30 ENCOUNTER — Ambulatory Visit (INDEPENDENT_AMBULATORY_CARE_PROVIDER_SITE_OTHER): Payer: Medicaid Other | Admitting: Obstetrics & Gynecology

## 2014-04-30 ENCOUNTER — Encounter: Payer: Self-pay | Admitting: Obstetrics & Gynecology

## 2014-04-30 VITALS — BP 114/85 | HR 92 | Temp 98.7°F | Wt 198.0 lb

## 2014-04-30 DIAGNOSIS — Z9189 Other specified personal risk factors, not elsewhere classified: Secondary | ICD-10-CM

## 2014-04-30 MED ORDER — FLUCONAZOLE 150 MG PO TABS: 150.0000 mg | ORAL_TABLET | Freq: Once | ORAL | Status: DC

## 2014-04-30 MED ORDER — METOCLOPRAMIDE HCL 10 MG PO TABS: 10.0000 mg | ORAL_TABLET | Freq: Three times a day (TID) | ORAL | Status: DC

## 2014-04-30 NOTE — Patient Instructions (Signed)
Patient information: Common breastfeeding problems (Beyond the Basics)  Author Jeanne Spencer, MD Section Editor Steven A Abrams, MD Deputy Editors Melanie S Kim, MD Constanza Villalba, PhD Disclosures: Jeanne Spencer, MD Nothing to disclose. Steven A Abrams, MD Grant/Research/Clinical Trial Support: [employer] (infant nutrition [specialized formulas for preterm infants]). Melanie S Kim, MD Employee of [employer] Constanza Villalba, PhD Employee of [employer]  Contributor disclosures are reviewed for conflicts of interest by the editorial group. When found, these are addressed by vetting through a multi-level review process, and through requirements for references to be provided to support the content. Appropriately referenced content is required of all authors and must conform to [employer] standards of evidence.  Conflict of interest policy  All topics are updated as new evidence becomes available and our peer review process is complete.  Literature review current through: Apr 2014.  This topic last updated: Oct 17, 2012.  BREASTFEEDING PROBLEMS OVERVIEW - Breastfeeding is universally recognized as the best way to feed an infant because it protects mother and infant from a variety of health problems. Even so, many women who start out breastfeeding stop before the recommended minimum of exclusive breastfeeding for six months. Often women stop because common problems interfere with their ability to breastfeed. Luckily, with sound guidance and appropriate medical treatment, most women can overcome these obstacles and continue breastfeeding for longer periods. This topic discusses common problems associated with breastfeeding and how to handle them. Other aspects of breastfeeding are discussed elsewhere. (See "Patient information: Deciding to breastfeed (Beyond the Basics)" and "Patient information: Breastfeeding guide (Beyond the Basics)" and "Patient information: Maternal health and nutrition  during breastfeeding (Beyond the Basics)" and "Patient information: Breast pumps (Beyond the Basics)".) INADEQUATE MILK INTAKE - The most common reason women stop breastfeeding is that they think their infant is not getting enough milk, but in many cases the mother has an adequate supply. A true inadequate supply can happen if the infant is unable to extract milk well or if the mother doesn't make enough milk. Unfortunately, figuring out if a mother has enough milk and if not, why not, can be challenging. Inadequate milk production - There are a number of reasons why a mother might not make enough milk, including that: ?Her breasts did not develop sufficiently during pregnancy - This can happen if she doesn't have enough milk-producing tissue (called glandular tissue) (figure 1). ?She previously had breast surgery or radiation treatment. ?She has a hormonal imbalance. ?She takes certain medications that interfere with milk production. Women who have had breast surgery, such a breast augmentation or breast reduction surgery, often have trouble making enough milk. For some, breastfeeding is impossible. If you had breast surgery, ask your healthcare provider if the type of surgery you had would totally interfere with breastfeeding. If not, or if you are unable to get complete information on your surgery, do go ahead and try, but make sure your healthcare provider closely monitors your baby's progress. Poor milk extraction - The most common reasons infants have trouble getting enough milk are: ?They do not get fed frequently enough (which can cause milk production to slow or stop) ?They cannot latch on properly (figure 2) ("A Perfect Latch" video; for a good demonstration of latch, place the timer of the video on minute 7:43) ?They are separated from their mother too much ?They are fed formula Many babies are sleepy and difficult to keep awake during the first several days after birth. This can prevent the  baby from getting   enough to eat. Other babies can have trouble controlling the muscles involved in suckling, which makes it hard for them to extract milk. Feeding difficulty is especially common among premature and late preterm babies. Many mothers judge adequacy of feeding by lack of crying. This can be misleading if the baby is not getting enough milk and is overly sleepy. Diagnosis of inadequate intake - Healthcare providers determine whether a baby is getting enough milk based on the following: ?Number of feeding sessions the mother reports having - During the first week of life, mothers with term infants (meaning they are not premature) generally nurse 8 to 12 times in 24 hours. By four weeks after delivery, nursing usually decreases to seven to nine times per day. ?Amount of urine and stool the baby makes - By the fifth day of life, infants who are getting enough milk urinate six to eight times a day and have three or more stools a day. (Once a mother's milk comes in, her infant's stool should be pale yellow and seedy.) ?Weight of the baby - Term infants lose an average of 7 percent of their birth weight in the first three to five days of life. They typically get back to their birth weight within one to two weeks. Once a mother's breasts fill with milk - by day three to five - her infant should not keep losing weight. If an infant has lost 10 percent of its weight or fails to return to its birth weight when expected, healthcare providers start to explore potential problems. Household scales are not accurate enough to detect these small weight differences. If you are using a medical scale for infants, remember to weigh the infant with the same clothes and diaper before and after the feeding. Management of inadequate intake - If your healthcare provider suspects your baby is not getting enough milk, he or she will want to figure out why. To do that, the healthcare provider will ask you about your  experiences breastfeeding and about your and your baby's medical history. A healthcare provider should also watch as you try to breastfeed to see if there could be something wrong with the way your baby latches on or with the baby's mouth. If so, it will be important for you to learn how to position your baby so that the baby can latch on properly (figure 2 and figure 3). If you are having trouble with this, the healthcare provider will direct you to community resources ? often a lactation consultant ? for assistance. If your baby has a good latch, but you still have problems with inadequate milk intake, your healthcare provider might suggest that you try to feed more often or try to stimulate more milk production by using a breast pump or expressing by hand (figure 4) ("Hand Expression of Breastmilk" video). There are medications called galactagogues (or lactagogues) that supposedly increase milk production, but it's unclear whether these medications actually work and whether they are safe for a nursing baby, so we do not recommend their use. NIPPLE AND BREAST PAIN - The second most common reason mothers stop breastfeeding early is nipple or breast pain. The causes of nipple and breast pain include: ?Nipple injury (caused by the baby or a breast pump) ?Engorgement, which means the breasts get overly full ?Plugged milk ducts ?Nipple and breast infections ?Excessive milk supply ?Skin disorders (such as dermatitis or psoriasis) affecting the nipple ?Nipple vasoconstriction, which means the blood vessels in the nipple tighten and do not let   enough blood through Possible causes of breast or nipple pain related to the baby could include: ?Ankyloglossia (also called tongue-tie), which is when the baby's tongue cannot move as freely as it should, making it hard for the baby to suckle effectively ?Torticollis, which is when the baby's neck is twisted, making it hard for the baby to nurse from both breasts  comfortably ?Birth defects in the shape of the baby's mouth that make it hard for the baby to latch on ?Uncoordinated suck, which is when the baby does not move its tongue in the correct rhythm to extract milk To determine the cause of your pain, your healthcare provider will examine you and your baby, and watch you breastfeed. He or she will also ask about your pain (when it started, what makes it better or worse), and about aspects of your health that could hold clues about the cause of your pain. The most important part of the exam takes place when the healthcare provider watches you breastfeed. That's because most cases of breast pain in the nursing mother are due to incorrect breastfeeding technique. One common problem is that the baby is not latching on properly, and so injures the nipple, but also cannot empty the breast. This, in turn, can lead to engorgement, plugged ducts, and breast infections. Nipple pain - Sore nipples are one of the most common complaints by new mothers. Pain due to nipple injury needs to be distinguished from nipple sensitivity, which normally increases during pregnancy and peaks about four days after giving birth. You can usually tell the difference between normal nipple sensitivity and pain caused by nipple injury based on when it happens and how it changes over time. Normal sensitivity typically subsides 30 seconds after suckling begins. It also diminishes on the fourth day after giving birth and completely resolves when the baby is about a week old. Nipple pain caused by trauma, on the other hand, persists or gets worse after suckling begins. Severe pain or pain that continues after the first week after birth is more likely to be due to nipple injury. Normal nipple sensitivity - If you have some discomfort related to normal nipple sensitivity, keep in mind that this sensitivity usually goes away after the first few suckles of a feeding, and stops happening after the first  week or two of nursing. If you find the "pins and needles" sensation of milk let-down to be uncomfortable, rest assured this discomfort also resolves in the first weeks of breastfeeding. If needed, you can take acetaminophen (sample brand name: Tylenol) to ease your discomfort. Nipple injury - Nipple injury usually is due to incorrect breastfeeding technique, particularly poor position or latch-on. Other factors that can make pain caused by injury worse include harsh breast cleansing, use of potentially irritating products, and biting by an older infant. Here are some things you can do to prevent nipple injury: ?Learn how to position your baby so that the baby can latch on properly (figure 2 and figure 3). If you are having trouble with this, get help from a healthcare provider or a lactation consultant. ?Try to keep your nipples dry, and allow them to air-dry after feedings. ?Do not use harsh soaps or cleansers on your breasts. ?If your baby's mouth has any abnormalities, make sure to have them addressed as soon as possible. For example, if your baby has tongue-tie, surgery to release the tongue will make it easier for the baby to latch on properly. ?If your baby is biting you, position   the baby so that his or her mouth is wide open during feedings. That will make it harder to bite. Also, stick your finger between your nipple and the baby's mouth any time he or she bites you and firmly say "no." Then put the baby down in a safe place. The baby will learn not to bite you. Here are some things you can do to promote healing if your nipples are already injured: ?Always start nursing with the breast that does not have the injury. ?If your nipples are cracked or raw, put an ointment on them, such as Vitamin A and D or purified lanolin (if you are not allergic) and cover them with a nonstick pad. This will help prevent nipple infection and keep the injured part of your nipple from sticking to your bra. If you  think your nipple is infected or you have a rash, see your healthcare provider. ?Use cool or warm compresses, if they seem to help. Avoid ice. ?Take a mild pain reliever, such as acetaminophen (brand name Tylenol) or ibuprofen (sample brand names: Advil, Motrin), before feeding. ?If nipple pain prevents your baby from emptying your breasts, try using a pump or hand expression to empty your breasts. This will give your nipples a chance to heal and prevent engorgement. Use the milk you remove to feed your baby. ?Do NOT use vitamin E oil on your nipples. At high levels, it could be toxic to your baby. Nipple vasoconstriction - Nipple vasoconstriction is when the blood vessels in the nipple tighten and do not let enough blood through. Mothers with this problem can have pain, burning, or numbness in their nipples in response to cold, nursing, or injury. The nipples can also turn white or blue, and then pink when the blood returns. One way to tell nipple vasoconstriction apart from other causes of nipple pain is that it can be predictably triggered by cold, while other causes of pain cannot. To manage nipple vasoconstriction, try to keep your whole body warm and dress warmly. Also, if possible, try to breastfeed in warm conditions. It might also help to avoid nicotine and caffeine, as they can make the problem worse. Engorgement - Engorgement is the medical term for when the breasts get too full of milk. It can make your breast feel full and firm and can cause pain and tenderness. Engorgement can sometimes impair the baby's ability to latch, which makes engorgement worse, because the baby cannot then empty the breast. Here are some things you can do to prevent and deal with engorgement: ?Learn how to position your baby so that the baby can latch on properly (figure 2 and figure 3). If you are having trouble with this, get help from a healthcare provider or a lactation consultant. ?If the engorgement makes it  hard for your baby to latch on, manually express a small amount of milk before each feeding to soften your areola and make it easier for the baby to latch on (figure 4). To do this, place your thumb and forefingers well behind your areola (close to your chest) and then compress them together and toward your nipple in a rhythmic fashion. You can also use your hand to present your nipple in a way that is easier to latch and to help get milk out for the baby while the baby is suckling. ?You can use a breast pump to help soften your breast before a feeding, but be careful not to do it too much. Using a pump too   much will stimulate your breast to make even more milk, which will make engorgement worse. ?Apply warm compresses or take a warm shower before a feeding. This can enhance let-down and may make it easier to get milk out. ?Take a mild pain reliever, such as acetaminophen (brand name Tylenol) or ibuprofen (sample brand names: Advil, Motrin). Plugged ducts - A plugged milk duct can cause a tender or painful lump to form on the breast. If the nipple itself is plugged, a white dot or bleb can form at the end of the nipple. Things that can lead to a plugged milk duct include poor feeding technique, wearing tight clothing or an ill-fitting bra, abrupt decrease in feeding, engorgement, and infections. Here are some things you can do to prevent and deal with a plugged duct: ?Learn how to position your baby so that the baby can latch on properly (figure 2 and figure 3). If you are having trouble with this, get help from a healthcare provider or a lactation consultant. Make sure to vary your position during feedings so every part of the breast gets emptied. You might even try to position the baby so that his or her chin is near the plugged area, because this positioning can help drain that area best. You can also try pumping or manually expressing after feedings to improve drainage. Do not stop breastfeeding, as this  could lead to engorgement and worsen the problem. ?Try using warm compresses or taking a warm shower and then manually massaging your breast from the outer part of your breast toward the nipple to advance the blockage toward the nipple or in the opposite direction to clear the duct. ?Take a mild pain reliever, such as acetaminophen (brand name Tylenol) or ibuprofen (sample brand names: Advil, Motrin). ?If your blockage does not get better within two days, see your healthcare provider, as what appears to be a blocked duct may be something more concerning. Galactoceles - Sometimes a blocked milk duct can cause a milk-filled cyst called a galactocele to form (picture 1). Unless they are infected, galactoceles are usually painless, but they can get quite large. If necessary, a healthcare provider can drain a galactocele using a needle, or suggest surgery if the problem is severe. BREAST INFECTIONS Lactational mastitis - Mastitis is an inflammation of the breast that is often associated with fever (which might be masked by pain medications), muscle and breast pain, and redness. It is not always caused by an infection, but most people associate it with infection. Mastitis can happen at any time during lactation, but it is most common during the first six weeks after delivery. Mastitis tends to occur if the nipples are damaged or the breasts stay engorged for too long or do not drain properly. To prevent and treat mastitis, it's important to get these problems under control. If you have any of the following symptoms, see your healthcare provider: ?A firm, red, and tender area of the breast ?Fever higher than 101?F or 38.5?C ?Muscle aches, chills, malaise, or flu-like symptoms If you do have an infection, your healthcare provider will probably put you on antibiotics. Here are some things you can do to manage mastitis: ?Take your antibiotics exactly as directed, if your healthcare provider prescribes them. If  you don't start to feel better within two to three days of starting antibiotics, call your healthcare provider. You may need a different antibiotic or may have a different problem. ?Continue breastfeeding, even while you are being treated, and work on your   feeding technique, so that your breasts empty well. ?Take a mild pain reliever, such as acetaminophen (sample brand name Tylenol) or ibuprofen (sample brand names: Advil, Motrin), if you think it could help. ?Apply cold compresses or ice packs. Yeast infection - Many women who are breastfeeding are diagnosed with a yeast infection of the nipple or breast (also called candidal infection) based on their symptoms (primarily nipple pain). Even so, yeast infections of the nipple or breast are poorly understood, and researchers aren't sure what role they play in nipple pain. For now, healthcare providers often diagnose yeast infections in women who have: ?Breast pain out of proportion to any apparent cause ?A history of vaginal yeast infections or an infant with a history of yeast infections such as thrush or diaper rash ?Shiny or flaky skin on the affected nipple ?Skin scrapings from the nipple or areola that test positive for yeast (or breast milk that does) Treatments include: ?Topical antifungals - Topical antifungals are creams or gels that contain medications called antifungals, which kill yeast. Women using these agents must wipe any remaining medication they can see off their nipples before each feeding and reapply them after each feeding. Antifungal ointments are not recommended, as the paraffins they contain could be harmful to the infant. ?Gentian violet - Gentian violet 0.25 to 1% is a purple medication that you apply to your baby's mouth with a Q-tip before a feeding. After the feeding, you apply more gentian violet to any areas of your nipple and areola that are not already purple. You do this once a day for three to four days. Gentian violet  is a bit messy and can sometimes irritate the baby's mouth or the nipple area, but it is effective. ?Antifungal pills - Mothers who do not get better with the options described above can be put on prescription antifungal pills. They can continue to breastfeed while on these pills, as the typical amount of drug that makes it into breast milk is safe for breastfeeding infants. BLOODY NIPPLE DISCHARGE - Some women have bloody nipple discharge during the first days to weeks of lactation. This is more common with the first pregnancy and has been called rusty pipe syndrome. It is thought to be caused by the increased blood flow to the breasts and ducts that happens when the mother starts making milk. The color of the milk varies from pink to red and generally resolves within a few days. Women who have bloody discharge for more than a week should be seen by a healthcare provider. MILK OVERSUPPLY - Some mothers make too much milk, which paradoxically can make breastfeeding difficult. Generally the production of milk is determined by the infant's demand, but in this case the supply exceeds demand. The problem begins early in lactation and is most common among women having their first child. In women with an oversupply of milk, the rush of the milk can be so strong that it causes the infant to choke and cough and have trouble feeding, or even to bite down to clamp the nipple. Infants whose mothers make too much milk can either gain weight quickly or gain too little weight because they cannot handle the flow of milk, or because they do not get the last of the milk in the breast, which has the most calories. If you have a problem with overproduction, don't worry. The problem usually goes away on its own. But tell your healthcare provider about it, so he or she can check whether you have   any hormonal imbalances or take any medications that could make the problems worse. Here are some things you can do to deal with milk  oversupply: ?Nurse in an upright position - Hold your baby upright to nurse and lean back or lie on your side (figure 3 and picture 2). This will give the baby better control of the flow of milk. ?Use your fingers to reduce the flow of milk - Try putting a scissors-hold on your areola or pressing on your breast with the heel of your hand to restrict flow. ?Give baby control - Let your baby interrupt feedings, and burp him or her often. ?Pump very little or not at all - Avoid pumping, because it can stimulate even more milk production, but you can hand-pump a little at the beginning of a feeding to relieve some of the pressure. ?Apply cold water or ice to your nipples to decrease leaking WHEN TO SEEK HELP - If you are unable to breastfeed due to engorgement, pain, or difficulty latching your infant, help is available. Talk to your obstetrical or pediatric healthcare provider, nurse, lactation consultant, or a breastfeeding counselor. (See 'Finding a lactation consultant' below.) WHERE TO GET MORE INFORMATION - Your healthcare provider is the best source of information for questions and concerns related to your medical problem. 

## 2014-04-30 NOTE — Progress Notes (Signed)
Patient ID: Morgan [name], female   DOB: 09/28/1985, 28 y.o.   MRN: [id number] Subjective:     Morgan Footmanreen M [name] is a 28 y.o. female who presents for a postpartum visit. She is 2 weeks postpartum following a spontaneous vaginal delivery. I have fully reviewed the prenatal and intrapartum course.  Outcome: spontaneous vaginal delivery. Anesthesia: epidural. Postpartum course has been complicated by breast pain, decreased milk production and back pain.. Baby's course has been unremarkable. Baby is feeding by breast and bottle. Bleeding staining only. Bowel function is normal. Bladder function is normal. Patient is not sexually active. Contraception method is abstinence. Postpartum depression screening: negative.  Tobacco, alcohol and substance abuse history reviewed.  Adult immunizations reviewed including TDAP, rubella and varicella.  The following portions of the patient's history were reviewed and updated as appropriate: allergies, current medications, past family history, past medical history, past social history, past surgical history and problem list.  Review of Systems Pertinent items are noted in HPI.   Objective:    BP 114/85 mmHg  Pulse 92  Temp(Src) 98.7 F (37.1 C)  Wt 89.812 kg (198 lb)          50% of 15 min visit spent on counseling and coordination of care.  Assessment:   ?Puerperal--breast candida infection Decreased milk production Back pain Plan:   Supportive measures reviewed Meds ordered this encounter  Medications  . metoCLOPramide (REGLAN) 10 MG tablet    Sig: Take 1 tablet (10 mg total) by mouth 3 (three) times daily before meals.    Dispense:  42 tablet    Refill:  1  . fluconazole (DIFLUCAN) 150 MG tablet    Sig: Take 1 tablet (150 mg total) by mouth once.    Dispense:  1 tablet    Refill:  2  Return in 1 mth for postpartum exam/Mirena IUD insertion

## 2014-05-23 ENCOUNTER — Telehealth: Payer: Self-pay | Admitting: *Deleted

## 2014-05-23 NOTE — Telephone Encounter (Signed)
Attempted to contact patient in regards to Mirena IUD Insertion. Left message on voicemail for patient to contact the office.

## 2014-05-25 NOTE — Telephone Encounter (Signed)
Patient has her postpartum appointment scheduled for 05-30-14 and has not resumed intercourse. Advised patient we would put her IUD in at the time of her appointment on 05-30-14.

## 2014-05-25 NOTE — Telephone Encounter (Signed)
Patient returned the call on 05-24-14 @ 9:38 and 11:25 am. Attempted to contact patient and left message for patient to call the office.

## 2014-05-30 ENCOUNTER — Encounter: Payer: Self-pay | Admitting: Obstetrics & Gynecology

## 2014-05-30 ENCOUNTER — Ambulatory Visit (INDEPENDENT_AMBULATORY_CARE_PROVIDER_SITE_OTHER): Payer: Medicaid Other | Admitting: Obstetrics & Gynecology

## 2014-05-30 VITALS — BP 108/74 | HR 76 | Temp 98.2°F | Ht 65.0 in | Wt 198.0 lb

## 2014-05-30 DIAGNOSIS — Z3202 Encounter for pregnancy test, result negative: Secondary | ICD-10-CM

## 2014-05-30 DIAGNOSIS — Z30430 Encounter for insertion of intrauterine contraceptive device: Secondary | ICD-10-CM

## 2014-05-30 DIAGNOSIS — Z01812 Encounter for preprocedural laboratory examination: Secondary | ICD-10-CM

## 2014-05-30 LAB — POCT URINE PREGNANCY: PREG TEST UR: NEGATIVE

## 2014-05-30 NOTE — Progress Notes (Signed)
Patient ID: Morgan FootmanIreen M [name], female   DOB: 08/25/1985, 28 y.o.   MRN: [id number] Subjective:     Morgan Footmanreen M [name] is a 28 y.o. female who presents for a postpartum visit. She is 6 weeks postpartum following a spontaneous vaginal delivery. I have fully reviewed the prenatal and intrapartum course.  Outcome: spontaneous vaginal delivery. Anesthesia: epidural. Postpartum course has been complicated by breast pain, decreased milk production and back pain.. Baby's course has been unremarkable. Baby is feeding by breast and bottle. Bleeding staining only. Bowel function is normal. Bladder function is normal. Patient is not sexually active. Contraception method is abstinence. Postpartum depression screening: negative.  Tobacco, alcohol and substance abuse history reviewed.  Adult immunizations reviewed including TDAP, rubella and varicella.  The following portions of the patient's history were reviewed and updated as appropriate: allergies, current medications, past family history, past medical history, past social history, past surgical history and problem list.  Review of Systems Pertinent items are noted in HPI.   Objective:    BP 108/74 mmHg  Pulse 76  Temp(Src) 98.2 F (36.8 C)  Ht 5\' 5"  (1.651 m)  Wt 89.812 kg (198 lb)  BMI 32.95 kg/m2  Breastfeeding? Yes         General:   alert  Skin:   no rash or abnormalities  Lungs:   clear to auscultation bilaterally  Heart:   regular rate and rhythm, S1, S2 normal, no murmur, click, rub or gallop  Abdomen:  normal findings: no organomegaly, soft, non-tender and no hernia  Pelvis:  External genitalia: normal general appearance Urinary system: urethral meatus normal and bladder without fullness, nontender Vaginal: normal without tenderness, induration or masses Cervix: normal appearance Adnexa: normal bimanual exam Uterus: anteverted and non-tender, normal size   IUD Insertion Procedure Note  Pre-operative Diagnosis: Requests a LARC  Post-operative  Diagnosis: same  Indications: contraception  Procedure Details  Urine pregnancy test was done and result was negative.  The risks (including infection, bleeding, pain, and uterine perforation) and benefits of the procedure were explained to the patient and Written informed consent was obtained.    Cervix cleansed with Betadine. Uterus sounded to 8 cm. IUD inserted without difficulty. String visible and trimmed. Patient tolerated procedure well.  IUD Information: Mirena.  Condition: Stable  Complications: None  Plan:  The patient was advised to call for any fever or for prolonged or severe pain or bleeding. She was advised to use OTC analgesics as needed for mild to moderate pain.    Assessment:   Normal postpartum exam S/P IUD insertion Plan:   Return prn

## 2014-06-01 ENCOUNTER — Telehealth: Payer: Self-pay

## 2014-06-01 MED ORDER — LEVONORGESTREL 20 MCG/24HR IU IUD: 1.0000 | INTRAUTERINE_SYSTEM | Freq: Once | INTRAUTERINE | Status: DC

## 2014-06-01 NOTE — Telephone Encounter (Signed)
[facility] - [facility] NOT TAKING NEW PATIENTS

## 2014-06-01 NOTE — Patient Instructions (Signed)

## 2014-06-06 ENCOUNTER — Other Ambulatory Visit: Payer: Self-pay | Admitting: Obstetrics & Gynecology

## 2014-06-07 NOTE — Telephone Encounter (Signed)
Please advise 

## 2014-06-16 NOTE — Telephone Encounter (Signed)
?  Nausea/vomiting--she is postpartum

## 2014-06-18 ENCOUNTER — Encounter: Payer: Self-pay | Admitting: *Deleted

## 2014-06-19 ENCOUNTER — Encounter: Payer: Self-pay | Admitting: Obstetrics & Gynecology

## 2014-07-16 NOTE — Telephone Encounter (Signed)
Please advise 

## 2014-07-26 ENCOUNTER — Telehealth: Payer: Self-pay | Admitting: Obstetrics

## 2014-07-26 NOTE — Telephone Encounter (Signed)
Task completed

## 2014-08-01 NOTE — Telephone Encounter (Signed)
08/01/2014 - Left patient a repeat message to please call and schedule appt. brm

## 2014-08-02 NOTE — Telephone Encounter (Signed)
08/02/2014 - No return calls from patient after repeated efforts to schedule appt. brm

## 2015-05-02 ENCOUNTER — Telehealth: Payer: Self-pay | Admitting: *Deleted

## 2015-05-02 NOTE — Telephone Encounter (Signed)
Patient states she was seeing Dr Tamela OddiJackson Moore and she was supposed to be referred to a primary care provider. I [facility] But they were not taking new patients. Can we refer her to someone else?

## 2015-05-03 ENCOUNTER — Other Ambulatory Visit: Payer: Self-pay | Admitting: Obstetrics

## 2015-05-03 DIAGNOSIS — Z0000 Encounter for general adult medical examination without abnormal findings: Secondary | ICD-10-CM

## 2015-05-03 NOTE — Telephone Encounter (Signed)
Referred to Internal Medicine.

## 2015-05-08 ENCOUNTER — Ambulatory Visit: Payer: Medicaid Other | Admitting: Certified Nurse Midwife

## 2016-07-06 ENCOUNTER — Ambulatory Visit: Payer: Self-pay | Attending: Internal Medicine | Admitting: Internal Medicine

## 2016-07-06 ENCOUNTER — Encounter: Payer: Self-pay | Admitting: Internal Medicine

## 2016-07-06 VITALS — BP 121/88 | HR 83 | Temp 98.7°F | Resp 16 | Wt 225.6 lb

## 2016-07-06 DIAGNOSIS — B181 Chronic viral hepatitis B without delta-agent: Secondary | ICD-10-CM | POA: Insufficient documentation

## 2016-07-06 DIAGNOSIS — Z23 Encounter for immunization: Secondary | ICD-10-CM

## 2016-07-06 DIAGNOSIS — M79643 Pain in unspecified hand: Secondary | ICD-10-CM | POA: Insufficient documentation

## 2016-07-06 DIAGNOSIS — M542 Cervicalgia: Secondary | ICD-10-CM | POA: Insufficient documentation

## 2016-07-06 DIAGNOSIS — Z8619 Personal history of other infectious and parasitic diseases: Secondary | ICD-10-CM | POA: Insufficient documentation

## 2016-07-06 DIAGNOSIS — D508 Other iron deficiency anemias: Secondary | ICD-10-CM

## 2016-07-06 DIAGNOSIS — G5603 Carpal tunnel syndrome, bilateral upper limbs: Secondary | ICD-10-CM

## 2016-07-06 DIAGNOSIS — Z0000 Encounter for general adult medical examination without abnormal findings: Secondary | ICD-10-CM

## 2016-07-06 DIAGNOSIS — Z3200 Encounter for pregnancy test, result unknown: Secondary | ICD-10-CM

## 2016-07-06 LAB — CMP AND LIVER
ALBUMIN: 4.2 g/dL (ref 3.6–5.1)
ALK PHOS: 75 U/L (ref 33–115)
ALT: 8 U/L (ref 6–29)
AST: 11 U/L (ref 10–30)
BILIRUBIN INDIRECT: 0.4 mg/dL (ref 0.2–1.2)
BILIRUBIN TOTAL: 0.5 mg/dL (ref 0.2–1.2)
BUN: 13 mg/dL (ref 7–25)
Bilirubin, Direct: 0.1 mg/dL (ref <=0.2)
CO2: 26 mmol/L (ref 20–31)
Calcium: 9.3 mg/dL (ref 8.6–10.2)
Chloride: 103 mmol/L (ref 98–110)
Creat: 0.57 mg/dL (ref 0.50–1.10)
Glucose, Bld: 88 mg/dL (ref 65–99)
Potassium: 4.5 mmol/L (ref 3.5–5.3)
Sodium: 138 mmol/L (ref 135–146)
TOTAL PROTEIN: 7.1 g/dL (ref 6.1–8.1)

## 2016-07-06 LAB — CBC WITH DIFFERENTIAL/PLATELET
Basophils Absolute: 0 cells/uL (ref 0–200)
Basophils Relative: 0 %
EOS PCT: 2 %
Eosinophils Absolute: 96 cells/uL (ref 15–500)
HCT: 36.2 % (ref 35.0–45.0)
Hemoglobin: 10.4 g/dL — ABNORMAL LOW (ref 11.7–15.5)
Lymphocytes Relative: 32 %
Lymphs Abs: 1536 cells/uL (ref 850–3900)
MCH: 19 pg — AB (ref 27.0–33.0)
MCHC: 29.6 g/dL — AB (ref 32.0–36.0)
MCV: 66.3 fL — AB (ref 80.0–100.0)
MONOS PCT: 7 %
Monocytes Absolute: 336 cells/uL (ref 200–950)
Neutro Abs: 2832 cells/uL (ref 1500–7800)
Neutrophils Relative %: 59 %
Platelets: 293 10*3/uL (ref 140–400)
RBC: 5.46 MIL/uL — ABNORMAL HIGH (ref 3.80–5.10)
RDW: 15 % (ref 11.0–15.0)
WBC: 4.8 10*3/uL (ref 3.8–10.8)

## 2016-07-06 LAB — POCT URINE PREGNANCY: Preg Test, Ur: NEGATIVE

## 2016-07-06 LAB — IRON,TIBC AND FERRITIN PANEL
%SAT: 6 % — AB (ref 11–50)
FERRITIN: 9 ng/mL — AB (ref 10–154)
Iron: 27 ug/dL — ABNORMAL LOW (ref 40–190)
TIBC: 457 ug/dL — ABNORMAL HIGH (ref 250–450)

## 2016-07-06 LAB — POCT GLYCOSYLATED HEMOGLOBIN (HGB A1C): HEMOGLOBIN A1C: 5.4

## 2016-07-06 LAB — TSH: TSH: 1.84 mIU/L

## 2016-07-06 NOTE — Patient Instructions (Addendum)
Financial aid packet.  Carpal Tunnel Syndrome Introduction Carpal tunnel syndrome is a condition that causes pain in your hand and arm. The carpal tunnel is a narrow area that is on the palm side of your wrist. Repeated wrist motion or certain diseases may cause swelling in the tunnel. This swelling can pinch the main nerve in the wrist (median nerve). Follow these instructions at home: If you have a splint:  Wear it as told by your doctor. Remove it only as told by your doctor.  Loosen the splint if your fingers:  Become numb and tingle.  Turn blue and cold.  Keep the splint clean and dry. General instructions  Take over-the-counter and prescription medicines only as told by your doctor.  Rest your wrist from any activity that may be causing your pain. If needed, talk to your employer about changes that can be made in your work, such as getting a wrist pad to use while typing.  If directed, apply ice to the painful area:  Put ice in a plastic bag.  Place a towel between your skin and the bag.  Leave the ice on for 20 minutes, 2-3 times per day.  Keep all follow-up visits as told by your doctor. This is important.  Do any exercises as told by your doctor, physical therapist, or occupational therapist. Contact a doctor if:  You have new symptoms.  Medicine does not help your pain.  Your symptoms get worse. This information is not intended to replace advice given to you by your health care provider. Make sure you discuss any questions you have with your health care provider. Document Released: 05/28/2011 Document Revised: 11/14/2015 Document Reviewed: 10/24/2014  2017 Elsevier   -  QUICK START PATIENT GUIDE TO LCHF/IF LOW CARB HIGH FAT / INTERMITTENT FASTING  Recommend: <50 gram carbohydrate a day for weightloss.  What is this diet and how does it work? o Insulin is a hormone made by your body that allows you to use sugar (glucose) from carbohydrates in the food  you eat for energy or to store glucose (as fat) for future use  o Insulin levels need to be lowered in order to utilize our stored energy (fat) o Many struggling with obesity are insulin resistant and have high levels of insulin o This diet works to lower your insulin in two ways o Fasting - allows your insulin levels to naturally decrease  o Avoiding carbohydrates - carbs trigger increase in insulin Low Carb Healthy Fat (LCHF) o Get a free app for your phone, such as MyFitnessPal, to help you track your macronutrients (carbs/protein/fats) and to track your weight and body measurements to see your progress o Set your goal for around 10% carbs/20% protein/70% fat o A good starting goal for amount of net carbs per day is 50 grams (some will aim for 20 grams) o "Net carbs" refers to total grams of carbs minus grams of fiber (as fiber is not typically absorbed). For example, if a food has 5g total carb and 3g fiber, that would be 2g net carbs o Increase healthy fats - eg. olive oil, eggs, nuts, avocado, cheese, butter, coconut, meats, fish o Avoid high carb foods - eg. bread, pasta, potatoes, rice, cookies, soda, juice, anything sugary o Buy full-fat ingredients (avoid low-fat versions, which often have more sugar) o No need to count calories, but pay close attention to grams of carbs on labels Intermittent Fasting (IF) o "Fasting" is going a period of time without eating -  it helps to stay busy and well-hydrated o Purpose of fasting is to allow insulin levels to drop as low as possible, allowing your body to switch into fat-burning mode o With this diet there are many approaches to fasting, but 16:8 and 24hr fasts are commonly used o 16:8 fast, usually 5-7 days a week - Fasting for 16 hours of the day, then eating all meals for the day over course of 8 hours. o 24 hour fast, usually 1-3 days a week - Typically eating one meal a day, then fasting until the next day. Plenty of fluids (and some salt to  help you hold onto fluids) are recommended during longer fasts.  o During fasts certain beverages are still acceptable - water, sparkling water, bone broth, black tea or coffee, or tea/coffee with small amount of heavy whipping cream Special note for those on diabetic medications o Discuss your medications with your physician. You may need to hold your medication or adjust to only taking when eating. Diabetics should keep close track of their blood sugars when making any changes to diet/meds, to ensure they are staying within normal limits For more info about LCHF/IF o Watch "Therapeutic Fasting - Solving the Two-Compartment Problem" video by Dr. Wylene Simmer on YouTube (SatelliteSeeker.no) for a great intro to these concepts o Read "The Obesity Code" and/or "The Complete Guide to Fasting" by Dr. Wylene Simmer o Go to www.dietdoctor.com for explanations, recipes, and infographics about foods to eat/avoid o Get a Free smartphone app that helps count carbohydrates  - ie MyKeto EXAMPLES TO GET STARTED Fasting Beverages -water (can add  tsp Pink Himalayan salt once or twice a day to help stay hydrated for longer fasts) -Sparkling water (such as [employer] or similar; avoid any with artificial sweeteners)  -Bone broth (multiple recipes available online or can buy pre-made) -Tea or Coffee (Adding heavy whipping cream or coconut oil to your tea or coffee can be helpful if you find yourself getting too hungry during the fasts. Can also add cinnamon for flavor. Or "bulletproof coffee.") Low Carb Healthy Fat Breakfast (if not fasting) -eggs in butter or olive oil with avocado -omelet with veggies and cheese  Lunch -hamburger with cheese and avocado wrapped in lettuce (no bun, no ketchup) -meat and cheese wrapped in lettuce (can dip in mustard or olive oil/vinegar/mayo) -salad with meat/cheese/nuts and higher fat dressing (vinaigrette or Ranch, etc) -tuna salad lettuce wrap -taco  meat with cheese, sour cream, guacamole, cheese over lettuce  Dinner -steak with herb butter or Barnaise sauce -"Fathead" pizza (uses cheese and almond flour for the dough - several recipes available online) -roasted or grilled chicken with skin on, with low carb sauce (buffalo, garlic butter, alfredo, pesto, etc) -baked salmon with lemon butter -chicken alfredo with zucchini noodles -[place] butter chicken with low carb garlic naan -egg roll in a bowl  Side Dishes -mashed cauliflower (homemade or available in freezer section) -roast vegetables (green veggies that grow above ground rather than root veggies) with butter or cheese -Caprese salad (fresh mozzarella, tomato and basil with olive oil) -homemade low-carb coleslaw Snacks/Desserts (try to avoid unnecessary snacking and sweets in general) -celery or cucumber dipped in guacamole or sour cream dip -cheese and meat slices  -raspberries with whipped cream (can make homemade with no sugar added) -low carb [place] butter cake  AVOID - sugar, diet/regular soda, potatoes, breads, rice, pasta, candy, cookies, cakes, muffins, juice, high carb fruit (bananas, grapes), beer, ketchup, barbeque and other sweet saucesInfluenza Virus  Vaccine injection (Fluarix) What is this medicine? INFLUENZA VIRUS VACCINE (in floo EN zuh VAHY ruhs vak SEEN) helps to reduce the risk of getting influenza also known as the flu. This medicine may be used for other purposes; ask your health care provider or pharmacist if you have questions. COMMON BRAND NAME(S): Fluarix, Fluzone What should I tell my health care provider before I take this medicine? They need to know if you have any of these conditions: -bleeding disorder like hemophilia -fever or infection -Guillain-Barre syndrome or other neurological problems -immune system problems -infection with the human immunodeficiency virus (HIV) or AIDS -low blood platelet counts -multiple sclerosis -an unusual or  allergic reaction to influenza virus vaccine, eggs, chicken proteins, latex, gentamicin, other medicines, foods, dyes or preservatives -pregnant or trying to get pregnant -breast-feeding How should I use this medicine? This vaccine is for injection into a muscle. It is given by a health care professional. A copy of Vaccine Information Statements will be given before each vaccination. Read this sheet carefully each time. The sheet may change frequently. Talk to your pediatrician regarding the use of this medicine in children. Special care may be needed. Overdosage: If you think you have taken too much of this medicine contact a poison control center or emergency room at once. NOTE: This medicine is only for you. Do not share this medicine with others. What if I miss a dose? This does not apply. What may interact with this medicine? -chemotherapy or radiation therapy -medicines that lower your immune system like etanercept, anakinra, infliximab, and adalimumab -medicines that treat or prevent blood clots like warfarin -phenytoin -steroid medicines like prednisone or cortisone -theophylline -vaccines This list may not describe all possible interactions. Give your health care provider a list of all the medicines, herbs, non-prescription drugs, or dietary supplements you use. Also tell them if you smoke, drink alcohol, or use illegal drugs. Some items may interact with your medicine. What should I watch for while using this medicine? Report any side effects that do not go away within 3 days to your doctor or health care professional. Call your health care provider if any unusual symptoms occur within 6 weeks of receiving this vaccine. You may still catch the flu, but the illness is not usually as bad. You cannot get the flu from the vaccine. The vaccine will not protect against colds or other illnesses that may cause fever. The vaccine is needed every year. What side effects may I notice from  receiving this medicine? Side effects that you should report to your doctor or health care professional as soon as possible: -allergic reactions like skin rash, itching or hives, swelling of the face, lips, or tongue Side effects that usually do not require medical attention (report to your doctor or health care professional if they continue or are bothersome): -fever -headache -muscle aches and pains -pain, tenderness, redness, or swelling at site where injected -weak or tired This list may not describe all possible side effects. Call your doctor for medical advice about side effects. You may report side effects to FDA at 1-800-FDA-1088. Where should I keep my medicine? This vaccine is only given in a clinic, pharmacy, doctor's office, or other health care setting and will not be stored at home. NOTE: This sheet is a summary. It may not cover all possible information. If you have questions about this medicine, talk to your doctor, pharmacist, or health care provider.  2017 Elsevier/Gold Standard (2008-01-04 09:30:40)

## 2016-07-06 NOTE — Progress Notes (Signed)
Morgan [name], is a 31 y.o. female  ZOX:[id number]  [id number]  DOB - 10/12/1985  CC:  Chief Complaint  Patient presents with  . New Patient (Initial Visit)  . Neck Pain  . Hand Pain       HPI: Morgan [name] is a 31 y.o. female here today to establish medical care.  G2p2.  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  Hx of iron def anemia, +mirena Sept  2015, hx of hep b, bilat carpal tunnel syndrome (r>L).  Per pt, her hands have been hurting more with use.  occasional finger numbness on right hand, wakes her up from sleep at times.  Co of neck pains as well, radiating down bilat shoulders and hands. Has tried otc aleve, tylenol and warm/cold w/o much relief.  Patient has No headache, No chest pain, No abdominal pain - No Nausea, No new weakness tingling or numbness, No Cough - SOB.   Not sleeping well at night, so tired in am. Not currently on iron supplementation.   Review of Systems: Per hpi, o/w all systems reviewed and negative.    No Known Allergies Past Medical History:  Diagnosis Date  . Complication of anesthesia    Patient states when given the epidural the numbness went up to her nose.   Marland Kitchen Hepatitis   . Hepatitis B carrier [facility])    Current Outpatient Prescriptions on File Prior to Visit  Medication Sig Dispense Refill  . levonorgestrel (MIRENA) 20 MCG/24HR IUD 1 Intra Uterine Device (1 each total) by Intrauterine route once. 1 each 0  . dicyclomine (BENTYL) 20 MG tablet Take 1 tablet (20 mg total) by mouth 4 (four) times daily - after meals and at bedtime. (Patient not taking: Reported on 07/06/2016) 120 tablet 5  . Fe Cbn-Fe Gluc-FA-B12-C-DSS (FERRALET 90) 90-1 MG TABS Take 1 tablet by mouth every morning. (Patient not taking: Reported on 07/06/2016) 60 each 3  . ferrous sulfate 325 (65 FE) MG tablet Take 325 mg by mouth daily with breakfast.    . fluconazole (DIFLUCAN) 150 MG tablet Take 1 tablet (150 mg total) by mouth once. (Patient not taking: Reported on  07/06/2016) 1 tablet 2  . metoCLOPramide (REGLAN) 10 MG tablet Take 1 tablet (10 mg total) by mouth 3 (three) times daily before meals. (Patient not taking: Reported on 07/06/2016) 42 tablet 1  . Prenatal Vit-Fe Fumarate-FA (PRENATAL MULTIVITAMIN) TABS tablet Take 1 tablet by mouth daily at 12 noon.     No current facility-administered medications on file prior to visit.    Family History  Problem Relation Age of Onset  . Alcohol abuse Neg Hx   . Arthritis Neg Hx   . Asthma Neg Hx   . Birth defects Neg Hx   . Cancer Neg Hx   . COPD Neg Hx   . Depression Neg Hx   . Diabetes Neg Hx   . Drug abuse Neg Hx   . Early death Neg Hx   . Hearing loss Neg Hx   . Heart disease Neg Hx   . Hyperlipidemia Neg Hx   . Hypertension Neg Hx   . Kidney disease Neg Hx   . Learning disabilities Neg Hx   . Mental illness Neg Hx   . Mental retardation Neg Hx   . Miscarriages / Stillbirths Neg Hx   . Stroke Neg Hx   . Vision loss Neg Hx    Social History   Social History  . Marital  status: Married    Spouse name: N/A  . Number of children: N/A  . Years of education: N/A   Occupational History  . Not on file.   Social History Main Topics  . Smoking status: Never Smoker  . Smokeless tobacco: Never Used  . Alcohol use No  . Drug use: No  . Sexual activity: Not Currently    Partners: Male    Birth control/ protection: None   Other Topics Concern  . Not on file   Social History Narrative  . No narrative on file    Objective:   Vitals:   07/06/16 0917  BP: 121/88  Pulse: 83  Resp: 16  Temp: 98.7 F (37.1 C)    Filed Weights   07/06/16 0917  Weight: 225 lb 9.6 oz (102.3 kg)    BP Readings from Last 3 Encounters:  07/06/16 121/88  05/30/14 108/74  04/30/14 114/85    Physical Exam: Constitutional: Patient appears well-developed and well-nourished. No distress. AAOx3, morbid obesity. HENT: Normocephalic, atraumatic, External right and left ear normal. Oropharynx is clear  and moist. bilat TMs clear. Eyes: Conjunctivae and EOM are normal. PERRL, no scleral icterus. Neck: Normal ROM. Neck supple. No JVD.  CVS: RRR, S1/S2 +, no murmurs, no gallops, no carotid bruit.  Pulmonary: Effort and breath sounds normal, no stridor, rhonchi, wheezes, rales.  Abdominal: Soft. BS +, obese, no distension, tenderness, rebound or guarding.  Musculoskeletal: Normal range of motion. No edema and no tenderness.   phalen test + right hand LE: bilat/ no c/c/e, pulses 2+ bilateral. Neuro: Alert.  muscle tone coordination wnl. No cranial nerve deficit grossly. Skin: Skin is warm and dry. No rash noted. Not diaphoretic. No erythema. No pallor. Psychiatric: Normal mood and affect. Behavior, judgment, thought content normal.  Lab Results  Component Value Date   WBC 10.1 04/13/2014   HGB 8.4 (L) 04/13/2014   HCT 28.6 (L) 04/13/2014   MCV 62.0 (L) 04/13/2014   PLT 184 04/13/2014   No results found for: CREATININE, BUN, NA, K, CL, CO2  Lab Results  Component Value Date   HGBA1C 5.5 12/11/2013   Lipid Panel  No results found for: CHOL, TRIG, HDL, CHOLHDL, VLDL, LDLCALC      Depression screen PHQ 2/9 07/06/2016  Decreased Interest 0  Down, Depressed, Hopeless 1  PHQ - 2 Score 1    Assessment and plan:   1. Other iron deficiency anemia - currently not on iron. - CMP and Liver - Iron, TIBC and Ferritin Panel  2. Healthcare maintenance - CBC with Differential - HgB A1c - TSH - CMP and Liver  3. Pregnancy examination or test, pregnancy unconfirmed - POCT urine pregnancy  - neg  4. Bilateral carpal tunnel syndrome, right > left bilat wrist splint rx provided, recd wear as long as able for next 2 wks.  Afterwards, wear nightly and as long as able during day as well. - info on carpal tunnel provided  5. Morbid obesity (HCC) Low carb diet recd, info on lc/hf/if provided, increase exercise recd.  6. Hx of hep b, pt requested ID fu, but uninsured. Recd apply for  financial aid first.  Return in about 4 weeks (around 08/03/2016) for pap smear.  The patient was given clear instructions to go to ER or return to medical center if symptoms don't improve, worsen or new problems develop. The patient verbalized understanding. The patient was told to call to get lab results if they haven't heard anything in the  next week.    This note has been created with Education officer, environmentalDragon speech recognition software and smart phrase technology. Any transcriptional errors are unintentional.   Pete Glatterawn T Trevione Wert, MD, MBA/MHA [facility] [place], [place] [phone]   07/06/2016, 9:43 AM

## 2016-07-07 ENCOUNTER — Other Ambulatory Visit: Payer: Self-pay | Admitting: Internal Medicine

## 2016-07-07 MED ORDER — FERROUS SULFATE 325 (65 FE) MG PO TABS: 325.0000 mg | ORAL_TABLET | Freq: Two times a day (BID) | ORAL | 3 refills | Status: DC

## 2016-07-07 MED ORDER — SENNOSIDES-DOCUSATE SODIUM 8.6-50 MG PO TABS: 1.0000 | ORAL_TABLET | Freq: Every evening | ORAL | 2 refills | Status: DC | PRN

## 2016-07-15 ENCOUNTER — Ambulatory Visit: Payer: Self-pay | Attending: Internal Medicine

## 2016-08-12 ENCOUNTER — Encounter (HOSPITAL_COMMUNITY): Payer: Self-pay | Admitting: Emergency Medicine

## 2016-08-12 ENCOUNTER — Ambulatory Visit (HOSPITAL_COMMUNITY)
Admission: EM | Admit: 2016-08-12 | Discharge: 2016-08-12 | Disposition: A | Discharge status: Home / Self Care | Payer: Self-pay | Attending: Family Medicine | Admitting: Family Medicine

## 2016-08-12 DIAGNOSIS — L232 Allergic contact dermatitis due to cosmetics: Secondary | ICD-10-CM

## 2016-08-12 MED ORDER — FLUTICASONE PROPIONATE 0.005 % EX OINT: 1.0000 "application " | TOPICAL_OINTMENT | Freq: Two times a day (BID) | CUTANEOUS | 1 refills | Status: DC

## 2016-08-12 MED ORDER — PREDNISONE 50 MG PO TABS: ORAL_TABLET | ORAL | 0 refills | Status: DC

## 2016-08-12 NOTE — Discharge Instructions (Signed)
Return as needed

## 2016-08-12 NOTE — ED Triage Notes (Signed)
[facility] a [facility] of facial burning and swelling that started 5 days ago from a cold sore.

## 2016-08-12 NOTE — ED Provider Notes (Signed)
[facility]    CSN: [id number] Arrival date & time: 08/12/16  1631     History   Chief Complaint Chief Complaint  Patient presents with  . Angioedema    HPI Morgan [name] is a 31 y.o. female.    Rash  Location:  Face Facial rash location:  Upper lip, lower lip and chin Quality: blistering, burning, itchiness, painful and redness   Pain details:    Severity:  Moderate   Onset quality:  Sudden   Duration:  5 days   Progression:  Worsening Severity:  Moderate Onset quality:  Sudden Progression:  Spreading Chronicity:  New Context: chemical exposure   Relieved by:  Nothing Ineffective treatments:  Moisturizers Associated symptoms: no fever, no sore throat, no throat swelling and no tongue swelling     Past Medical History:  Diagnosis Date  . Complication of anesthesia    Patient states when given the epidural the numbness went up to her nose.   Marland Kitchen. Hepatitis   . Hepatitis B carrier [facility](HCC)     Patient Active Problem List   Diagnosis Date Noted  . IBS (irritable bowel syndrome) 02/11/2014  . Iron deficiency anemia 01/05/2014  . Hepatitis B carrier (HCC) 12/11/2013  . Obesity (BMI 35.0-39.9 without comorbidity) 12/11/2013  . Hyperprolactinemia (HCC) 06/01/2013    History reviewed. No pertinent surgical history.  OB History    Gravida Para Term Preterm AB Living   2 2 1 1   2    SAB TAB Ectopic Multiple Live Births           2       Home Medications    Prior to Admission medications   Medication Sig Start Date End Date Taking? Authorizing Provider  levonorgestrel (MIRENA) 20 MCG/24HR IUD 1 Intra Uterine Device (1 each total) by Intrauterine route once. 06/01/14  Yes Antionette CharLisa Jackson-Moore, MD  fluticasone (CUTIVATE) 0.005 % ointment Apply 1 application topically 2 (two) times daily. 08/12/16   Linna HoffJames D Arita Severtson, MD    Family History Family History  Problem Relation Age of Onset  . Alcohol abuse Neg Hx   . Arthritis Neg Hx   . Asthma Neg Hx   .  Birth defects Neg Hx   . Cancer Neg Hx   . COPD Neg Hx   . Depression Neg Hx   . Diabetes Neg Hx   . Drug abuse Neg Hx   . Early death Neg Hx   . Hearing loss Neg Hx   . Heart disease Neg Hx   . Hyperlipidemia Neg Hx   . Hypertension Neg Hx   . Kidney disease Neg Hx   . Learning disabilities Neg Hx   . Mental illness Neg Hx   . Mental retardation Neg Hx   . Miscarriages / Stillbirths Neg Hx   . Stroke Neg Hx   . Vision loss Neg Hx     Social History Social History  Substance Use Topics  . Smoking status: Never Smoker  . Smokeless tobacco: Never Used  . Alcohol use No     Allergies   Patient has no known allergies.   Review of Systems Review of Systems  Constitutional: Negative.  Negative for fever.  HENT: Negative for sore throat.   Skin: Positive for rash.  All other systems reviewed and are negative.    Physical Exam Triage Vital Signs ED Triage Vitals [08/12/16 1702]  Enc Vitals Group     BP 115/63     Pulse Rate  71     Resp 18     Temp 99.4 F (37.4 C)     Temp Source Oral     SpO2 100 %     Weight      Height      Head Circumference      Peak Flow      Pain Score 9     Pain Loc      Pain Edu?      Excl. in GC?    No data found.   Updated Vital Signs BP 115/63 (BP Location: Right Arm)   Pulse 71   Temp 99.4 F (37.4 C) (Oral)   Resp 18   SpO2 100%   Visual Acuity Right Eye Distance:   Left Eye Distance:   Bilateral Distance:    Right Eye Near:   Left Eye Near:    Bilateral Near:     Physical Exam  Constitutional: She is oriented to person, place, and time. She appears well-developed and well-nourished. She appears distressed.  HENT:  Mouth/Throat: Oropharynx is clear and moist.  Lymphadenopathy:    She has cervical adenopathy.  Neurological: She is alert and oriented to person, place, and time.  Skin: Skin is warm and dry. Rash noted. There is erythema.  Weeping, swelling dermatitis, tender, assoc lymphadenitis  Nursing  note and vitals reviewed.    UC Treatments / Results  Labs (all labs ordered are listed, but only abnormal results are displayed) Labs Reviewed - No data to display  EKG  EKG Interpretation None       Radiology No results found.  Procedures Procedures (including critical care time)  Medications Ordered in UC Medications - No data to display   Initial Impression / Assessment and Plan / UC Course  I have reviewed the triage vital signs and the nursing notes.  Pertinent labs & imaging results that were available during my care of the patient were reviewed by me and considered in my medical decision making (see chart for details).       Final Clinical Impressions(s) / UC Diagnoses   Final diagnoses:  Allergic contact dermatitis due to cosmetics    New Prescriptions New Prescriptions   FLUTICASONE (CUTIVATE) 0.005 % OINTMENT    Apply 1 application topically 2 (two) times daily.     Linna Hoff, MD 08/12/16 [phone]

## 2016-09-08 ENCOUNTER — Encounter (HOSPITAL_COMMUNITY): Payer: Self-pay | Admitting: Family Medicine

## 2016-09-08 ENCOUNTER — Ambulatory Visit (HOSPITAL_COMMUNITY)
Admission: EM | Admit: 2016-09-08 | Discharge: 2016-09-08 | Disposition: A | Discharge status: Home / Self Care | Payer: Self-pay | Attending: Family Medicine | Admitting: Family Medicine

## 2016-09-08 DIAGNOSIS — H1032 Unspecified acute conjunctivitis, left eye: Secondary | ICD-10-CM

## 2016-09-08 DIAGNOSIS — J00 Acute nasopharyngitis [common cold]: Secondary | ICD-10-CM

## 2016-09-08 MED ORDER — AZITHROMYCIN 250 MG PO TABS: 250.0000 mg | ORAL_TABLET | Freq: Every day | ORAL | 0 refills | Status: DC

## 2016-09-08 MED ORDER — POLYMYXIN B-TRIMETHOPRIM 10000-0.1 UNIT/ML-% OP SOLN: 2.0000 [drp] | OPHTHALMIC | 0 refills | Status: DC

## 2016-09-08 MED ORDER — IPRATROPIUM BROMIDE 0.06 % NA SOLN: 2.0000 | Freq: Four times a day (QID) | NASAL | 0 refills | Status: DC

## 2016-09-08 NOTE — ED Provider Notes (Signed)
CSN: [id number]     Arrival date & time 09/08/16  1531 History   First MD Initiated Contact with Patient 09/08/16 1616     Chief Complaint  Patient presents with  . Eye Problem   (Consider location/radiation/quality/duration/timing/severity/associated sxs/prior Treatment) Patient c/o left eye swelling and redness.  She has yellow exudate from left eye today.  She has had uri sx's for last 2 weeks.   The history is provided by the patient.  Eye Problem  Location:  Left eye Quality:  Burning Severity:  Moderate Onset quality:  Sudden Duration:  2 weeks Timing:  Constant Progression:  Worsening Chronicity:  New Relieved by:  Nothing Worsened by:  Nothing Ineffective treatments:  None tried Associated symptoms: redness     Past Medical History:  Diagnosis Date  . Complication of anesthesia    Patient states when given the epidural the numbness went up to her nose.   Marland Kitchen. Hepatitis   . Hepatitis B carrier [facility](HCC)    History reviewed. No pertinent surgical history. Family History  Problem Relation Age of Onset  . Alcohol abuse Neg Hx   . Arthritis Neg Hx   . Asthma Neg Hx   . Birth defects Neg Hx   . Cancer Neg Hx   . COPD Neg Hx   . Depression Neg Hx   . Diabetes Neg Hx   . Drug abuse Neg Hx   . Early death Neg Hx   . Hearing loss Neg Hx   . Heart disease Neg Hx   . Hyperlipidemia Neg Hx   . Hypertension Neg Hx   . Kidney disease Neg Hx   . Learning disabilities Neg Hx   . Mental illness Neg Hx   . Mental retardation Neg Hx   . Miscarriages / Stillbirths Neg Hx   . Stroke Neg Hx   . Vision loss Neg Hx    Social History  Substance Use Topics  . Smoking status: Never Smoker  . Smokeless tobacco: Never Used  . Alcohol use No   OB History    Gravida Para Term Preterm AB Living   2 2 1 1   2    SAB TAB Ectopic Multiple Live Births           2     Review of Systems  Constitutional: Negative.   HENT: Negative.   Eyes: Positive for pain and redness.   Respiratory: Negative.   Cardiovascular: Negative.   Gastrointestinal: Negative.   Endocrine: Negative.   Genitourinary: Negative.   Musculoskeletal: Negative.   Allergic/Immunologic: Negative.   Neurological: Negative.   Hematological: Negative.   Psychiatric/Behavioral: Negative.     Allergies  Patient has no known allergies.  Home Medications   Prior to Admission medications   Medication Sig Start Date End Date Taking? Authorizing Provider  azithromycin (ZITHROMAX) 250 MG tablet Take 1 tablet (250 mg total) by mouth daily. Take first 2 tablets together, then 1 every day until finished. 09/08/16   Deatra CanterWilliam J Oxford, FNP  ipratropium (ATROVENT) 0.06 % nasal spray Place 2 sprays into both nostrils 4 (four) times daily. 09/08/16   Deatra CanterWilliam J Oxford, FNP  trimethoprim-polymyxin b (POLYTRIM) ophthalmic solution Place 2 drops into the left eye every 4 (four) hours. 09/08/16   Deatra CanterWilliam J Oxford, FNP   Meds Ordered and Administered this Visit  Medications - No data to display  BP 112/70   Pulse 81   Temp 97.8 F (36.6 C)   Resp 18   SpO2  100%  No data found.   Physical Exam  Constitutional: She is oriented to person, place, and time. She appears well-developed and well-nourished.  HENT:  Head: Normocephalic and atraumatic.  Right Ear: External ear normal.  Left Ear: External ear normal.  Mouth/Throat: Oropharynx is clear and moist.  Eyes: EOM are normal. Pupils are equal, round, and reactive to light.  Left eye conjunctiva erythematous and draining.  Neck: Normal range of motion. Neck supple.  Cardiovascular: Normal rate, regular rhythm and normal heart sounds.   Pulmonary/Chest: Effort normal and breath sounds normal.  Abdominal: Soft. Bowel sounds are normal.  Neurological: She is alert and oriented to person, place, and time.  Nursing note and vitals reviewed.   Urgent Care Course     Procedures (including critical care time)  Labs Review Labs Reviewed - No data to  display  Imaging Review No results found.   Visual Acuity Review  Right Eye Distance:   Left Eye Distance:   Bilateral Distance:    Right Eye Near:   Left Eye Near:    Bilateral Near:         MDM   1. Acute nasopharyngitis   2. Acute bacterial conjunctivitis of left eye    Zpak Atrovent Nasal Spray Polytrim eye gtt's  1 gtt OS q 4 hours #75ml    Deatra Canter, FNP 09/08/16 1643

## 2016-09-08 NOTE — ED Triage Notes (Signed)
Pt here with redness and swelling to left eye. sts recent URI.

## 2016-10-20 ENCOUNTER — Ambulatory Visit: Payer: Self-pay | Attending: Internal Medicine | Admitting: Internal Medicine

## 2016-10-20 ENCOUNTER — Encounter: Payer: Self-pay | Admitting: Internal Medicine

## 2016-10-20 VITALS — BP 126/86 | HR 66 | Temp 98.8°F | Resp 16 | Wt 229.6 lb

## 2016-10-20 DIAGNOSIS — M25531 Pain in right wrist: Secondary | ICD-10-CM | POA: Insufficient documentation

## 2016-10-20 DIAGNOSIS — Z124 Encounter for screening for malignant neoplasm of cervix: Secondary | ICD-10-CM

## 2016-10-20 DIAGNOSIS — D509 Iron deficiency anemia, unspecified: Secondary | ICD-10-CM | POA: Insufficient documentation

## 2016-10-20 DIAGNOSIS — E559 Vitamin D deficiency, unspecified: Secondary | ICD-10-CM | POA: Insufficient documentation

## 2016-10-20 DIAGNOSIS — Z01419 Encounter for gynecological examination (general) (routine) without abnormal findings: Secondary | ICD-10-CM | POA: Insufficient documentation

## 2016-10-20 DIAGNOSIS — M25532 Pain in left wrist: Secondary | ICD-10-CM | POA: Insufficient documentation

## 2016-10-20 DIAGNOSIS — Z6839 Body mass index (BMI) 39.0-39.9, adult: Secondary | ICD-10-CM | POA: Insufficient documentation

## 2016-10-20 DIAGNOSIS — E669 Obesity, unspecified: Secondary | ICD-10-CM

## 2016-10-20 DIAGNOSIS — B181 Chronic viral hepatitis B without delta-agent: Secondary | ICD-10-CM | POA: Insufficient documentation

## 2016-10-20 DIAGNOSIS — Z13220 Encounter for screening for lipoid disorders: Secondary | ICD-10-CM

## 2016-10-20 DIAGNOSIS — M79642 Pain in left hand: Secondary | ICD-10-CM | POA: Insufficient documentation

## 2016-10-20 MED ORDER — NAPROXEN 500 MG PO TABS: 500.0000 mg | ORAL_TABLET | Freq: Two times a day (BID) | ORAL | 2 refills | Status: DC

## 2016-10-20 NOTE — Patient Instructions (Signed)
Carpal Tunnel Syndrome Carpal tunnel syndrome is a condition that causes pain in your hand and arm. The carpal tunnel is a narrow area that is on the palm side of your wrist. Repeated wrist motion or certain diseases may cause swelling in the tunnel. This swelling can pinch the main nerve in the wrist (median nerve). Follow these instructions at home: If you have a splint:   Wear it as told by your doctor. Remove it only as told by your doctor.  Loosen the splint if your fingers:  Become numb and tingle.  Turn blue and cold.  Keep the splint clean and dry. General instructions   Take over-the-counter and prescription medicines only as told by your doctor.  Rest your wrist from any activity that may be causing your pain. If needed, talk to your employer about changes that can be made in your work, such as getting a wrist pad to use while typing.  If directed, apply ice to the painful area:  Put ice in a plastic bag.  Place a towel between your skin and the bag.  Leave the ice on for 20 minutes, 2-3 times per day.  Keep all follow-up visits as told by your doctor. This is important.  Do any exercises as told by your doctor, physical therapist, or occupational therapist. Contact a doctor if:  You have new symptoms.  Medicine does not help your pain.  Your symptoms get worse. This information is not intended to replace advice given to you by your health care provider. Make sure you discuss any questions you have with your health care provider. Document Released: 05/28/2011 Document Revised: 11/14/2015 Document Reviewed: 10/24/2014 Elsevier Interactive Patient Education  2017 [place] Maintenance, Female Adopting a healthy lifestyle and getting preventive care can go a long way to promote health and wellness. Talk with your health care provider about what schedule of regular examinations is right for you. This is a good chance for you to check in with your  provider about disease prevention and staying healthy. In between checkups, there are plenty of things you can do on your own. Experts have done a lot of research about which lifestyle changes and preventive measures are most likely to keep you healthy. Ask your health care provider for more information. Weight and diet Eat a healthy diet  Be sure to include plenty of vegetables, fruits, low-fat dairy products, and lean protein.  Do not eat a lot of foods high in solid fats, added sugars, or salt.  Get regular exercise. This is one of the most important things you can do for your health.  Most adults should exercise for at least 150 minutes each week. The exercise should increase your heart rate and make you sweat (moderate-intensity exercise).  Most adults should also do strengthening exercises at least twice a week. This is in addition to the moderate-intensity exercise. Maintain a healthy weight  Body mass index (BMI) is a measurement that can be used to identify possible weight problems. It estimates body fat based on height and weight. Your health care provider can help determine your BMI and help you achieve or maintain a healthy weight.  For females 81 years of age and older:  A BMI below 18.5 is considered underweight.  A BMI of 18.5 to 24.9 is normal.  A BMI of 25 to 29.9 is considered overweight.  A BMI of 30 and above is considered obese. Watch levels of cholesterol and blood lipids  You should start having your blood tested for lipids and cholesterol at 32 years of age, then have this test every 5 years.  You may need to have your cholesterol levels checked more often if:  Your lipid or cholesterol levels are high.  You are older than 32 years of age.  You are at high risk for heart disease. Cancer screening Lung Cancer  Lung cancer screening is recommended for adults 16-16 years old who are at high risk for lung cancer because of a history of smoking.  A yearly  low-dose CT scan of the lungs is recommended for people who:  Currently smoke.  Have quit within the past 15 years.  Have at least a 30-pack-year history of smoking. A pack year is smoking an average of one pack of cigarettes a day for 1 year.  Yearly screening should continue until it has been 15 years since you quit.  Yearly screening should stop if you develop a health problem that would prevent you from having lung cancer treatment. Breast Cancer  Practice breast self-awareness. This means understanding how your breasts normally appear and feel.  It also means doing regular breast self-exams. Let your health care provider know about any changes, no matter how small.  If you are in your 20s or 30s, you should have a clinical breast exam (CBE) by a health care provider every 1-3 years as part of a regular health exam.  If you are 43 or older, have a CBE every year. Also consider having a breast X-ray (mammogram) every year.  If you have a family history of breast cancer, talk to your health care provider about genetic screening.  If you are at high risk for breast cancer, talk to your health care provider about having an MRI and a mammogram every year.  Breast cancer gene (BRCA) assessment is recommended for women who have family members with BRCA-related cancers. BRCA-related cancers include:  Breast.  Ovarian.  Tubal.  Peritoneal cancers.  Results of the assessment will determine the need for genetic counseling and BRCA1 and BRCA2 testing. Cervical Cancer  Your health care provider may recommend that you be screened regularly for cancer of the pelvic organs (ovaries, uterus, and vagina). This screening involves a pelvic examination, including checking for microscopic changes to the surface of your cervix (Pap test). You may be encouraged to have this screening done every 3 years, beginning at age 56.  For women ages 34-65, health care providers may recommend pelvic exams  and Pap testing every 3 years, or they may recommend the Pap and pelvic exam, combined with testing for human papilloma virus (HPV), every 5 years. Some types of HPV increase your risk of cervical cancer. Testing for HPV may also be done on women of any age with unclear Pap test results.  Other health care providers may not recommend any screening for nonpregnant women who are considered low risk for pelvic cancer and who do not have symptoms. Ask your health care provider if a screening pelvic exam is right for you.  If you have had past treatment for cervical cancer or a condition that could lead to cancer, you need Pap tests and screening for cancer for at least 20 years after your treatment. If Pap tests have been discontinued, your risk factors (such as having a new sexual partner) need to be reassessed to determine if screening should resume. Some women have medical problems that increase the chance of getting cervical cancer. In these cases, your  health care provider may recommend more frequent screening and Pap tests. Colorectal Cancer  This type of cancer can be detected and often prevented.  Routine colorectal cancer screening usually begins at 32 years of age and continues through 32 years of age.  Your health care provider may recommend screening at an earlier age if you have risk factors for colon cancer.  Your health care provider may also recommend using home test kits to check for hidden blood in the stool.  A small camera at the end of a tube can be used to examine your colon directly (sigmoidoscopy or colonoscopy). This is done to check for the earliest forms of colorectal cancer.  Routine screening usually begins at age 28.  Direct examination of the colon should be repeated every 5-10 years through 31 years of age. However, you may need to be screened more often if early forms of precancerous polyps or small growths are found. Skin Cancer  Check your skin from head to toe  regularly.  Tell your health care provider about any new moles or changes in moles, especially if there is a change in a mole's shape or color.  Also tell your health care provider if you have a mole that is larger than the size of a pencil eraser.  Always use sunscreen. Apply sunscreen liberally and repeatedly throughout the day.  Protect yourself by wearing long sleeves, pants, a wide-brimmed hat, and sunglasses whenever you are outside. Heart disease, diabetes, and high blood pressure  High blood pressure causes heart disease and increases the risk of stroke. High blood pressure is more likely to develop in:  People who have blood pressure in the high end of the normal range (130-139/85-89 mm Hg).  People who are overweight or obese.  People who are African American.  If you are 70-97 years of age, have your blood pressure checked every 3-5 years. If you are 55 years of age or older, have your blood pressure checked every year. You should have your blood pressure measured twice-once when you are at a hospital or clinic, and once when you are not at a hospital or clinic. Record the average of the two measurements. To check your blood pressure when you are not at a hospital or clinic, you can use:  An automated blood pressure machine at a pharmacy.  A home blood pressure monitor.  If you are between 27 years and 90 years old, ask your health care provider if you should take aspirin to prevent strokes.  Have regular diabetes screenings. This involves taking a blood sample to check your fasting blood sugar level.  If you are at a normal weight and have a low risk for diabetes, have this test once every three years after 33 years of age.  If you are overweight and have a high risk for diabetes, consider being tested at a younger age or more often. Preventing infection Hepatitis B  If you have a higher risk for hepatitis B, you should be screened for this virus. You are considered at  high risk for hepatitis B if:  You were born in a country where hepatitis B is common. Ask your health care provider which countries are considered high risk.  Your parents were born in a high-risk country, and you have not been immunized against hepatitis B (hepatitis B vaccine).  You have HIV or AIDS.  You use needles to inject street drugs.  You live with someone who has hepatitis B.  You have  had sex with someone who has hepatitis B.  You get hemodialysis treatment.  You take certain medicines for conditions, including cancer, organ transplantation, and autoimmune conditions. Hepatitis C  Blood testing is recommended for:  Everyone born from [birth year] through 1966.  Anyone with known risk factors for hepatitis C. Sexually transmitted infections (STIs)  You should be screened for sexually transmitted infections (STIs) including gonorrhea and chlamydia if:  You are sexually active and are younger than 32 years of age.  You are older than 32 years of age and your health care provider tells you that you are at risk for this type of infection.  Your sexual activity has changed since you were last screened and you are at an increased risk for chlamydia or gonorrhea. Ask your health care provider if you are at risk.  If you do not have HIV, but are at risk, it may be recommended that you take a prescription medicine daily to prevent HIV infection. This is called pre-exposure prophylaxis (PrEP). You are considered at risk if:  You are sexually active and do not regularly use condoms or know the HIV status of your partner(s).  You take drugs by injection.  You are sexually active with a partner who has HIV. Talk with your health care provider about whether you are at high risk of being infected with HIV. If you choose to begin PrEP, you should first be tested for HIV. You should then be tested every 3 months for as long as you are taking PrEP. Pregnancy  If you are premenopausal and  you may become pregnant, ask your health care provider about preconception counseling.  If you may become pregnant, take 400 to 800 micrograms (mcg) of folic acid every day.  If you want to prevent pregnancy, talk to your health care provider about birth control (contraception). Osteoporosis and menopause  Osteoporosis is a disease in which the bones lose minerals and strength with aging. This can result in serious bone fractures. Your risk for osteoporosis can be identified using a bone density scan.  If you are 28 years of age or older, or if you are at risk for osteoporosis and fractures, ask your health care provider if you should be screened.  Ask your health care provider whether you should take a calcium or vitamin D supplement to lower your risk for osteoporosis.  Menopause may have certain physical symptoms and risks.  Hormone replacement therapy may reduce some of these symptoms and risks. Talk to your health care provider about whether hormone replacement therapy is right for you. Follow these instructions at home:  Schedule regular health, dental, and eye exams.  Stay current with your immunizations.  Do not use any tobacco products including cigarettes, chewing tobacco, or electronic cigarettes.  If you are pregnant, do not drink alcohol.  If you are breastfeeding, limit how much and how often you drink alcohol.  Limit alcohol intake to no more than 1 drink per day for nonpregnant women. One drink equals 12 ounces of beer, 5 ounces of wine, or 1 ounces of hard liquor.  Do not use street drugs.  Do not share needles.  Ask your health care provider for help if you need support or information about quitting drugs.  Tell your health care provider if you often feel depressed.  Tell your health care provider if you have ever been abused or do not feel safe at home. This information is not intended to replace advice given to you by your  health care provider. Make sure  you discuss any questions you have with your health care provider. Document Released: 12/22/2010 Document Revised: 11/14/2015 Document Reviewed: 03/12/2015 Elsevier Interactive Patient Education  2017 [employer].

## 2016-10-20 NOTE — Progress Notes (Signed)
Morgan [name], is a 31 y.o. female  ZOX:[id number]  [id number]:[id number]  DOB - 06-Mar-1986  Chief Complaint  Patient presents with  . Gynecologic Exam  . Follow-up        Subjective:   Morgan [name] is a 31 y.o. 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    Her bilat hand pains have been acting up, has been using her wrist braces at night, but feels more numbness in the ulnar regions of her hands lately.  Has been on modified low carb diet, but not losing any weight.  Denies any abnml breast lumps/bumps, no family hx of breast cancer, some rare vag spotting at times, but not often.   Does not smoke/drink.  House wife., 2 young kids 3 1/2 and 2 1/2 yo.  Feels safe at home.  Patient has No headache, No chest pain, No abdominal pain - No Nausea, No new weakness tingling or numbness, No Cough - SOB.  No problems updated.  ALLERGIES: No Known Allergies  PAST MEDICAL HISTORY: Past Medical History:  Diagnosis Date  . Complication of anesthesia    Patient states when given the epidural the numbness went up to her nose.   Marland Kitchen Hepatitis   . Hepatitis B carrier [facility])     MEDICATIONS AT HOME: Prior to Admission medications   Medication Sig Start Date End Date Taking? Authorizing Provider  azithromycin (ZITHROMAX) 250 MG tablet Take 1 tablet (250 mg total) by mouth daily. Take first 2 tablets together, then 1 every day until finished. Patient not taking: Reported on 10/20/2016 09/08/16   Deatra Canter, FNP  ipratropium (ATROVENT) 0.06 % nasal spray Place 2 sprays into both nostrils 4 (four) times daily. Patient not taking: Reported on 10/20/2016 09/08/16   Deatra Canter, FNP  naproxen (NAPROSYN) 500 MG tablet Take 1 tablet (500 mg total) by mouth 2 (two) times daily with a meal.  10/20/16   Pete Glatter, MD  trimethoprim-polymyxin b (POLYTRIM) ophthalmic solution Place 2 drops into the left eye every 4 (four) hours. Patient not taking: Reported on 10/20/2016 09/08/16   Deatra Canter, FNP     Objective:   Vitals:   10/20/16 0856  BP: 126/86  Pulse: 66  Resp: 16  Temp: 98.8 F (37.1 C)  TempSrc: Oral  SpO2: 97%  Weight: 229 lb 9.6 oz (104.1 kg)    Exam General appearance : Awake, alert, not in any distress. Speech Clear. Not toxic looking, morbid obese, pleasant. HEENT: Atraumatic and Normocephalic, pupils equally reactive to light. Neck: supple, no JVD. No cervical lymphadenopathy.  Chest:Good air entry bilaterally, no added sounds. Breast /axilla: bilat nml appearance, not dippling noted. No palpable masses/nodules/nipple discharge noted on exam  CVS: S1 S2 regular, no murmurs/gallups or rubs. Abdomen: Bowel sounds active, Non tender and not distended with no gaurding, rigidity or rebound. Pelvic Exam: Cervix normal in appearance, retroverted and posterior, noted iud tag at the right side of cervical os, external genitalia normal, no adnexal masses or tenderness, no cervical motion tenderness, rectovaginal septum normal, uterus normal size, shape, and consistency and vagina normal with thin white discharge   Extremities: B/L Lower Ext shows no edema, both legs are warm to touch Neurology: Awake alert, and oriented X 3, CN II-XII grossly intact,  Non focal Skin:No Rash  Data Review Lab Results  Component Value Date   HGBA1C 5.4 07/06/2016   HGBA1C 5.5 12/11/2013    Depression screen [facility] 2/9 10/20/2016 07/06/2016  Decreased Interest 0 0  Down, Depressed, Hopeless 1 1  PHQ - 2 Score 1 1      Assessment & Plan   1. Pap smear for cervical cancer screening - cyt pap, w/ wet prep  2. Iron deficiency anemia, unspecified iron deficiency anemia type - CBC with Differential - Iron and TIBC - Ferritin  3. Obesity (BMI 35.0-39.9 without  comorbidity) dw low carb diet, likely at platteu, needs to reduce carb intake further to see if can lose weight., add intermittent fasting in will help as well.  4. Vitamin D deficiency - VITAMIN D 25 Hydroxy (Vit-D Deficiency, Fractures)  5. Cholesterol screening - Lipid Panel  6. IUD tag noted present on exam.  7. Likely bilat carpel tunnel - advised to use wrist splints 24 hrs for next week or so again until sxs improve, than use nightly  Patient have been counseled extensively about nutrition and exercise  Return in about 3 months (around 01/20/2017), or if symptoms worsen or fail to improve.  The patient was given clear instructions to go to ER or return to medical center if symptoms don't improve, worsen or new problems develop. The patient verbalized understanding. The patient was told to call to get lab results if they haven't heard anything in the next week.   This note has been created with Education officer, environmental. Any transcriptional errors are unintentional.   Pete Glatter, MD, MBA/MHA [facility] [place], [place] [phone]   10/20/2016, 10:35 AM

## 2016-10-21 ENCOUNTER — Telehealth: Payer: Self-pay | Admitting: Internal Medicine

## 2016-10-21 ENCOUNTER — Encounter: Payer: Self-pay | Admitting: Internal Medicine

## 2016-10-21 ENCOUNTER — Other Ambulatory Visit: Payer: Self-pay | Admitting: Internal Medicine

## 2016-10-21 LAB — LIPID PANEL
CHOL/HDL RATIO: 2.7 ratio (ref 0.0–4.4)
CHOLESTEROL TOTAL: 139 mg/dL (ref 100–199)
HDL: 52 mg/dL (ref >39)
LDL Calculated: 75 mg/dL (ref 0–99)
TRIGLYCERIDES: 62 mg/dL (ref 0–149)
VLDL Cholesterol Cal: 12 mg/dL (ref 5–40)

## 2016-10-21 LAB — CBC WITH DIFFERENTIAL/PLATELET
BASOS: 0 %
Basophils Absolute: 0 10*3/uL (ref 0.0–0.2)
EOS (ABSOLUTE): 0.1 10*3/uL (ref 0.0–0.4)
EOS: 2 %
HEMATOCRIT: 33.4 % — AB (ref 34.0–46.6)
HEMOGLOBIN: 9.5 g/dL — AB (ref 11.1–15.9)
IMMATURE GRANS (ABS): 0 10*3/uL (ref 0.0–0.1)
IMMATURE GRANULOCYTES: 0 %
LYMPHS: 45 %
Lymphocytes Absolute: 1.6 10*3/uL (ref 0.7–3.1)
MCH: 18.2 pg — ABNORMAL LOW (ref 26.6–33.0)
MCHC: 28.4 g/dL — ABNORMAL LOW (ref 31.5–35.7)
MCV: 64 fL — AB (ref 79–97)
MONOCYTES: 6 %
Monocytes Absolute: 0.2 10*3/uL (ref 0.1–0.9)
NEUTROS ABS: 1.6 10*3/uL (ref 1.4–7.0)
Neutrophils: 47 %
PLATELETS: 257 10*3/uL (ref 150–379)
RBC: 5.22 x10E6/uL (ref 3.77–5.28)
RDW: 15 % (ref 12.3–15.4)
WBC: 3.5 10*3/uL (ref 3.4–10.8)

## 2016-10-21 LAB — IRON AND TIBC
IRON SATURATION: 7 % — AB (ref 15–55)
IRON: 28 ug/dL (ref 27–159)
Total Iron Binding Capacity: 425 ug/dL (ref 250–450)
UIBC: 397 ug/dL (ref 131–425)

## 2016-10-21 LAB — CERVICOVAGINAL ANCILLARY ONLY: Wet Prep (BD Affirm): POSITIVE — AB

## 2016-10-21 LAB — FERRITIN: FERRITIN: 10 ng/mL — AB (ref 15–150)

## 2016-10-21 LAB — VITAMIN D 25 HYDROXY (VIT D DEFICIENCY, FRACTURES): VIT D 25 HYDROXY: 21.9 ng/mL — AB (ref 30.0–100.0)

## 2016-10-21 MED ORDER — FERROUS SULFATE 325 (65 FE) MG PO TABS: 325.0000 mg | ORAL_TABLET | Freq: Two times a day (BID) | ORAL | 3 refills | Status: DC

## 2016-10-21 MED ORDER — PROMETHAZINE HCL 25 MG PO TABS: 25.0000 mg | ORAL_TABLET | Freq: Three times a day (TID) | ORAL | 0 refills | Status: DC | PRN

## 2016-10-21 NOTE — Telephone Encounter (Signed)
Patient concern

## 2016-10-21 NOTE — Telephone Encounter (Signed)
Called pt, confirmed dob. She just wanted to remind me about the phenergan. I just put in in about 30 mins ago. Did not have any other questions.

## 2016-10-21 NOTE — Progress Notes (Signed)
Saw pt today w/ her father in law. She c/o of severe nausea w/ iron pills so unable to take more than once daily. Prn phenergan rx given.

## 2016-10-22 LAB — CYTOLOGY - PAP
Diagnosis: NEGATIVE
HPV: NOT DETECTED

## 2016-10-27 ENCOUNTER — Encounter: Payer: Self-pay | Admitting: Internal Medicine

## 2016-10-27 ENCOUNTER — Other Ambulatory Visit: Payer: Self-pay | Admitting: Internal Medicine

## 2016-10-27 MED ORDER — CLINDAMYCIN HCL 300 MG PO CAPS: 300.0000 mg | ORAL_CAPSULE | Freq: Two times a day (BID) | ORAL | 0 refills | Status: DC

## 2016-10-27 NOTE — Telephone Encounter (Signed)
Patient concern regarding screening

## 2016-11-05 ENCOUNTER — Encounter: Payer: Self-pay | Admitting: Internal Medicine

## 2018-05-23 LAB — OB RESULTS CONSOLE HIV ANTIBODY (ROUTINE TESTING): HIV: NONREACTIVE

## 2018-05-23 LAB — OB RESULTS CONSOLE HEPATITIS B SURFACE ANTIGEN: Hepatitis B Surface Ag: NEGATIVE

## 2018-06-08 ENCOUNTER — Encounter: Payer: Self-pay | Admitting: Certified Nurse Midwife

## 2018-06-22 NOTE — L&D Delivery Note (Addendum)
Called to room by RN for delivery, private not in hospital at time  - Patient SROM at [phone] and delivery occurred shortly after  -Upon entering room baby is delivered, crying spontaneously   Delivery Note At 9:24 AM a viable and healthy female was delivered via Vaginal, Spontaneous APGAR: 8, 9; weight pending.   Placenta delivered spontaneously and intact by CNM, 3V Cord.  Anesthesia:  Epidural  Episiotomy:  None Lacerations:  None Suture Repair:  None Est. Blood Loss (mL):  75  Mom to postpartum.  Baby to Couplet care / Skin to Skin.  Lajean Manes CNM 12/29/2018, 9:35 AM  Called by RN at 9:19 for imminent delivery Arrived at 9:36 with baby delivered by RN and spontaneous delivery of placenta with CNM Perineum is intact Uterine massage evacuated clots: total EBL 150 cc Mom and baby doing well

## 2018-10-20 ENCOUNTER — Encounter: Payer: Self-pay | Admitting: Skilled Nursing Facility1

## 2018-10-20 ENCOUNTER — Encounter: Payer: Medicaid Other | Attending: Obstetrics and Gynecology | Admitting: Skilled Nursing Facility1

## 2018-10-20 ENCOUNTER — Other Ambulatory Visit: Payer: Self-pay

## 2018-10-20 DIAGNOSIS — O24419 Gestational diabetes mellitus in pregnancy, unspecified control: Secondary | ICD-10-CM | POA: Diagnosis not present

## 2018-10-20 NOTE — Progress Notes (Addendum)
This visit was completed via telephone due to the COVID-19 pandemic.   I spoke with pt and verified that I was speaking with the correct person with two patient identifiers (full name and date of birth).   I discussed the limitations related to this kind of visit and the patient is willing to proceed.  Pt states this is her third pregnancy and the first time she has had GDM. Pt states she is about [redacted] weeks along in her pregnancy.  Pt started checking her blood sugar yesterday. Fasting: 83 2 hours after breakfast: 108 Pt reports feeling shaky and sweaty a couple times around 8pm having eaten 20 minute prior to that; pt states she has a fast heart rate but did experience faster heart beat with shaky and sweaty. This was at the time of fasting for 12 hours for rhamadan. Pt states she will not continue to fast.  Pt was knowledgable of GDM with limitations.  Pt states she Wakes at 9am not eating until 1-2pm.; eating 2 incomplete meals 7 days a week and 1 snack 3-4 days a week. Pt states she needs a nap about 2-3 days a week. Pt is consuming an average of 20 grams of carbohydrate a day (10% of her recommended needs) and about 800 calories a day (44% her recommended needs) if her reported 24 hr recall is accurate.  Pt states she has a bowel movement every 2-3 days.  Pt states she weighs about 184 pounds weighing 160 pounds before pregnancy.  Pt states she does not have any hair loss or brittle nails.  Goals: -Check your blood sugar fasting every morning and 2 hours after each meal -Aim to get in a minimum of 200 grams of carbohydrate a day and a minimum 80-90 grams of protein a day with a minimum of 1800 calories a day; a minimum of 96 fluid ounces a day (so add in 2 bottles of water). (recommendations based on her later in gestation pregnancy, stated weight, and lack of consumption of carbohydrate and calories in the past) -Walk 30 minutes about 5 days a week Diabetes Self-Management Education  Visit  Type: First/Initial  10/20/2018  [name], identified by name and date of birth, is a 33 y.o. female with a diagnosis of Diabetes: Gestational Diabetes.   ASSESSMENT  currently breastfeeding. There is no height or weight on file to calculate BMI.  Diabetes Self-Management Education - 10/20/18 0735      Visit Information   Visit Type  First/Initial      Initial Visit   Diabetes Type  Gestational Diabetes    Are you currently following a meal plan?  No    Are you taking your medications as prescribed?  Not on Medications      Health Coping   How would you rate your overall health?  Excellent      Psychosocial Assessment   Patient Belief/Attitude about Diabetes  Motivated to manage diabetes    Self-management support  Friends;Family      Pre-Education Assessment   Patient understands the diabetes disease and treatment process.  Needs Instruction    Patient understands incorporating nutritional management into lifestyle.  Needs Instruction    Patient undertands incorporating physical activity into lifestyle.  Needs Instruction    Patient understands using medications safely.  Needs Instruction    Patient understands monitoring blood glucose, interpreting and using results  Needs Instruction    Patient understands prevention, detection, and treatment of acute complications.  Needs  Instruction    Patient understands prevention, detection, and treatment of chronic complications.  Needs Instruction    Patient understands how to develop strategies to address psychosocial issues.  Needs Instruction    Patient understands how to develop strategies to promote health/change behavior.  Needs Instruction      Complications   How often do you check your blood sugar?  1-2 times/day    Fasting Blood glucose range (mg/dL)  93-818    Postprandial Blood glucose range (mg/dL)  29-937    Number of hypoglycemic episodes per month  3    Can you tell when your blood sugar is low?  Yes     What do you do if your blood sugar is low?  sat downa nd breathed deeply    Have you had a dilated eye exam in the past 12 months?  No    Have you had a dental exam in the past 12 months?  No    Are you checking your feet?  N/A      Dietary Intake   Lunch  1-2pm: 2-3 eggs with cheese and spinach and coffee with half and half     Snack (afternoon)  1/2 fruit    Dinner   7-8pm: chicken or fish with salad-broccoli, tomtato, onion, olive oil with vinegar and salt     Beverage(s)  water, coffee, diet soda      Exercise   Exercise Type  Light (walking / raking leaves)    How many days per week to you exercise?  3    How many minutes per day do you exercise?  45    Total minutes per week of exercise  135      Patient Education   Previous Diabetes Education  No       Individualized Plan for Diabetes Self-Management Training:   Learning Objective:  Patient will have a greater understanding of diabetes self-management. Patient education plan is to attend individual and/or group sessions per assessed needs and concerns.   Plan:   There are no Patient Instructions on file for this visit.  Expected Outcomes:     Education material provided: GDM packet emailed to pt  If problems or questions, patient to contact team via:  Phone  Future DSME appointment:

## 2018-10-24 ENCOUNTER — Telehealth: Payer: Self-pay | Admitting: Cardiology

## 2018-10-25 ENCOUNTER — Encounter: Payer: Self-pay | Admitting: Cardiology

## 2018-10-25 ENCOUNTER — Telehealth (INDEPENDENT_AMBULATORY_CARE_PROVIDER_SITE_OTHER): Payer: Medicaid Other | Admitting: Cardiology

## 2018-10-25 ENCOUNTER — Telehealth: Payer: Self-pay | Admitting: *Deleted

## 2018-10-25 ENCOUNTER — Encounter: Payer: Self-pay | Admitting: *Deleted

## 2018-10-25 VITALS — BP 100/70 | HR 95 | Ht 60.5 in | Wt 180.0 lb

## 2018-10-25 DIAGNOSIS — R002 Palpitations: Secondary | ICD-10-CM

## 2018-10-25 DIAGNOSIS — Z7189 Other specified counseling: Secondary | ICD-10-CM | POA: Diagnosis not present

## 2018-10-25 NOTE — Progress Notes (Signed)
Virtual Visit via Video Note   This visit type was conducted due to national recommendations for restrictions regarding the COVID-19 Pandemic (e.g. social distancing) in an effort to limit this patient's exposure and mitigate transmission in our community.  Due to her co-morbid illnesses, this patient is at least at moderate risk for complications without adequate follow up.  This format is felt to be most appropriate for this patient at this time.  All issues noted in this document were discussed and addressed.  A limited physical exam was performed with this format.  Please refer to the patient's chart for her consent to telehealth for [facility].   Date:  10/25/2018   ID:  Morgan [name], DOB 1985-12-06, MRN [id number]  Patient Location: Home Provider Location: Home  PCP:  Pete Glatter, MD (Inactive)  Cardiologist:  No primary care provider on file.  Electrophysiologist:  None   Evaluation Performed:  New Patient Evaluation  Chief Complaint:  palpitations  History of Present Illness:    Morgan [name] is a 33 y.o. female with PMH of prior tachycardia, currently [redacted] weeks pregnant (3rd pregnancy).  The patient does not have symptoms concerning for COVID-19 infection (fever, chills, cough, or new shortness of breath).   Tachycardia/palpitations: -Initial onset: initial >10 years ago, HR >160 on monitor, was told it was benign -Frequency/Duration: 2 mos, with hr to 140bpm. Initially 15-20 min, now up to 3 hr long over the last two weeks. -Associated symptoms:mild sob with these events -Aggravating/alleviating factors: any time, wake up from sleep, eating, walking, etc -Syncope/near syncope: none -Prior cardiac history: none -Prior ECG: NSR -Prior workup: holter. Echo in the past, told normal -Prior treatment: none -Possible medication interactions: been on meds since 2nd trimester -Caffeine: 1 cup cofee/day, usually decaf -Alcohol: none -Tobacco: none -OTC  supplements: none -Comorbidities: gestational diabetes with this pregnancy, no inslin required. History of anemia with pregnancy, being monitored -Exercise level: regular activities, takes care of kids -Labs: TSH, kidney function/electrolytes, CBC reviewed from [facility] report -Cardiac ROS: no chest pain, no shortness of breath at rest, no PND, no orthopnea, no LE edema. -Family history: just hypertension   Past Medical History:  Diagnosis Date  . Complication of anesthesia    Patient states when given the epidural the numbness went up to her nose.   . Diabetes mellitus without complication (HCC)   . Hepatitis   . Hepatitis B carrier [facility])    Past Surgical History:  Procedure Laterality Date  . CARPAL TUNNEL RELEASE       No outpatient medications have been marked as taking for the 10/25/18 encounter (Appointment) with Jodelle Red, MD.     Allergies:   Patient has no known allergies.   Social History   Tobacco Use  . Smoking status: Never Smoker  . Smokeless tobacco: Never Used  Substance Use Topics  . Alcohol use: No    Alcohol/week: 0.0 standard drinks  . Drug use: No     Family Hx: The patient's family history is negative for Alcohol abuse, Arthritis, Asthma, Birth defects, Cancer, COPD, Depression, Diabetes, Drug abuse, Early death, Hearing loss, Heart disease, Hyperlipidemia, Hypertension, Kidney disease, Learning disabilities, Mental illness, Mental retardation, Miscarriages / Stillbirths, Stroke, and Vision loss.  ROS:   Please see the history of present illness.    Constitutional: Negative for chills, fever, night sweats, unintentional weight loss  HENT: Negative for ear pain and hearing loss.   Eyes: Negative for loss of vision and eye  pain.  Respiratory: Negative for cough, sputum, wheezing.   Cardiovascular: See HPI. Gastrointestinal: Negative for abdominal pain, melena, and hematochezia.  Genitourinary: Negative for dysuria and hematuria.   Musculoskeletal: Negative for falls and myalgias.  Skin: Negative for itching and rash.  Neurological: Negative for focal weakness, focal sensory changes and loss of consciousness.  Endo/Heme/Allergies: Does not bruise/bleed easily.  All other systems reviewed and are negative.   Prior CV studies:   The following studies were reviewed today: None  Labs/Other Tests and Data Reviewed:    EKG:  An ECG dated 03/07/13 was personally reviewed today and demonstrated:  NSR  Recent Labs: No results found for requested labs within last 8760 hours.   Recent Lipid Panel Lab Results  Component Value Date/Time   CHOL 139 10/20/2016 09:59 AM   TRIG 62 10/20/2016 09:59 AM   HDL 52 10/20/2016 09:59 AM   CHOLHDL 2.7 10/20/2016 09:59 AM   LDLCALC 75 10/20/2016 09:59 AM    Wt Readings from Last 3 Encounters:  10/20/16 229 lb 9.6 oz (104.1 kg)  07/06/16 225 lb 9.6 oz (102.3 kg)  05/30/14 198 lb (89.8 kg)     Objective:    Vital Signs:  BP 100/70   Pulse 95   Ht 5' 0.5" (1.537 m)   Wt 180 lb (81.6 kg)   BMI 34.58 kg/m    VITAL SIGNS:  reviewed GEN:  no acute distress EYES:  sclerae anicteric, EOMI - Extraocular Movements Intact RESPIRATORY:  normal respiratory effort, symmetric expansion CARDIOVASCULAR:  no JVD seen SKIN:  no rash, lesions or ulcers. MUSCULOSKELETAL:  no obvious deformities. NEURO:  alert and oriented x 3, no obvious focal deficit PSYCH:  normal affect  ASSESSMENT & PLAN:    1. Palpitations: reports increasing frequency and duration. Distant prior workup reported as unremarkable -3 day Zio monitor given the frequency of her symptoms -if Zio abnormal, then would get echocardiogram -discussed red flag warning signs that need immediate medical attention  COVID-19 Education: The signs and symptoms of COVID-19 were discussed with the patient and how to seek care for testing (follow up with PCP or arrange E-visit).  The importance of social distancing was discussed  today.  Patient Instructions  Medication Instructions:  Your Physician recommend you continue on your current medication as directed.    If you need a refill on your cardiac medications before your next appointment, please call your pharmacy.   Lab work: None  Testing/Procedures: Our physician has recommended that you wear an 3 DAY ZIO-PATCH monitor. The Zio patch cardiac monitor continuously records heart rhythm data for up to 14 days, this is for patients being evaluated for multiple types heart rhythms. For the first 24 hours post application, please avoid getting the Zio monitor wet in the shower or by excessive sweating during exercise. After that, feel free to carry on with regular activities. Keep soaps and lotions away from the ZIO XT Patch.   This will be placed at our [facility] location - [address].      Follow-Up: Follow up appointment will be based of monitor report.         Medication Adjustments/Labs and Tests Ordered: Current medicines are reviewed at length with the patient today.  Concerns regarding medicines are outlined above.   Tests Ordered: Orders Placed This Encounter  Procedures  . LONG TERM MONITOR (3-14 DAYS)    Medication Changes: No orders of the defined types were placed in this encounter.  Disposition:  Follow up TBD based on results of monitor  Signed, Jodelle RedBridgette Taci Sterling, MD  10/25/2018 2:03 PM    [facility]

## 2018-10-25 NOTE — Telephone Encounter (Signed)
Virtual Visit Pre-Appointment Phone Call  "(Name), I am calling you today to discuss your upcoming appointment. We are currently trying to limit exposure to the virus that causes COVID-19 by seeing patients at home rather than in the office."  1. "What is the BEST phone [name] to call the day of the visit?" - include this in appointment notes  2. Do you have or have access to (through a family member/friend) a smartphone with video capability that we can use for your visit?" a. If yes - list this [name] in appt notes as cell (if different from BEST phone #) and list the appointment type as a VIDEO visit in appointment notes b. If no - list the appointment type as a PHONE visit in appointment notes  3. Confirm consent - "In the setting of the current Covid19 crisis, you are scheduled for a (phone or video) visit with your provider on (date) at (time).  Just as we do with many in-office visits, in order for you to participate in this visit, we must obtain consent.  If you'd like, I can send this to your mychart (if signed up) or email for you to review.  Otherwise, I can obtain your verbal consent now.  All virtual visits are billed to your insurance company just like a normal visit would be.  By agreeing to a virtual visit, we'd like you to understand that the technology does not allow for your provider to perform an examination, and thus may limit your provider's ability to fully assess your condition. If your provider identifies any concerns that need to be evaluated in person, we will make arrangements to do so.  Finally, though the technology is pretty good, we cannot assure that it will always work on either your or our end, and in the setting of a video visit, we may have to convert it to a phone-only visit.  In either situation, we cannot ensure that we have a secure connection.  Are you willing to proceed?" STAFF: Did the patient verbally acknowledge consent to telehealth visit? Document  YES/NO here: YES  4. Advise patient to be prepared - "Two hours prior to your appointment, go ahead and check your blood pressure, pulse, oxygen saturation, and your weight (if you have the equipment to check those) and write them all down. When your visit starts, your provider will ask you for this information. If you have an Apple Watch or Kardia device, please plan to have heart rate information ready on the day of your appointment. Please have a pen and paper handy nearby the day of the visit as well."  5. Give patient instructions for MyChart download to smartphone OR Doximity/Doxy.me as below if video visit (depending on what platform provider is using)  6. Inform patient they will receive a phone call 15 minutes prior to their appointment time (may be from unknown caller ID) so they should be prepared to answer    TELEPHONE CALL NOTE  Morgan [name] has been deemed a candidate for a follow-up tele-health visit to limit community exposure during the Covid-19 pandemic. I spoke with the patient via phone to ensure availability of phone/video source, confirm preferred email & phone [name], and discuss instructions and expectations.  I reminded Morgan [name] to be prepared with any vital sign and/or heart rhythm information that could potentially be obtained via home monitoring, at the time of her visit. I reminded Morgan [name] to expect a phone call prior to  her visit.  Morgan [name], CMA 10/25/2018 1:19 PM   INSTRUCTIONS FOR DOWNLOADING THE MYCHART APP TO SMARTPHONE  - The patient must first make sure to have activated MyChart and know their login information - If Apple, go to [employer] and type in MyChart in the search bar and download the app. If Android, ask patient to go to [employer] and type in [place] in the search bar and download the app. The app is free but as with any other app downloads, their phone may require them to verify saved payment information or  Apple/Android password.  - The patient will need to then log into the app with their MyChart username and password, and select [facility] as their healthcare provider to link the account. When it is time for your visit, go to the MyChart app, find appointments, and click Begin Video Visit. Be sure to Select Allow for your device to access the Microphone and Camera for your visit. You will then be connected, and your provider will be with you shortly.  **If they have any issues connecting, or need assistance please contact MyChart service desk [phone])**  **If using a computer, in order to ensure the best quality for their visit they will need to use either of the following Internet Browsers: [employer] or Google Chrome**  IF USING DOXIMITY or DOXY.ME - The patient will receive a link just prior to their visit by text.     FULL LENGTH CONSENT FOR TELE-HEALTH VISIT   I hereby voluntarily request, consent and authorize [facility] and its employed or contracted physicians, physician assistants, nurse practitioners or other licensed health care professionals (the Practitioner), to provide me with telemedicine health care services (the Services") as deemed necessary by the treating Practitioner. I acknowledge and consent to receive the Services by the Practitioner via telemedicine. I understand that the telemedicine visit will involve communicating with the Practitioner through live audiovisual communication technology and the disclosure of certain medical information by electronic transmission. I acknowledge that I have been given the opportunity to request an in-person assessment or other available alternative prior to the telemedicine visit and am voluntarily participating in the telemedicine visit.  I understand that I have the right to withhold or withdraw my consent to the use of telemedicine in the course of my care at any time, without affecting my right to future care  or treatment, and that the Practitioner or I may terminate the telemedicine visit at any time. I understand that I have the right to inspect all information obtained and/or recorded in the course of the telemedicine visit and may receive copies of available information for a reasonable fee.  I understand that some of the potential risks of receiving the Services via telemedicine include:   Delay or interruption in medical evaluation due to technological equipment failure or disruption;  Information transmitted may not be sufficient (e.g. poor resolution of images) to allow for appropriate medical decision making by the Practitioner; and/or   In rare instances, security protocols could fail, causing a breach of personal health information.  Furthermore, I acknowledge that it is my responsibility to provide information about my medical history, conditions and care that is complete and accurate to the best of my ability. I acknowledge that Practitioner's advice, recommendations, and/or decision may be based on factors not within their control, such as incomplete or inaccurate data provided by me or distortions of diagnostic images or specimens that may result from electronic transmissions.  I understand that the practice of medicine is not an exact science and that Practitioner makes no warranties or guarantees regarding treatment outcomes. I acknowledge that I will receive a copy of this consent concurrently upon execution via email to the email address I last provided but may also request a printed copy by calling the office of [facility].    I understand that my insurance will be billed for this visit.   I have read or had this consent read to me.  I understand the contents of this consent, which adequately explains the benefits and risks of the Services being provided via telemedicine.   I have been provided ample opportunity to ask questions regarding this consent and the Services and have had  my questions answered to my satisfaction.  I give my informed consent for the services to be provided through the use of telemedicine in my medical care  By participating in this telemedicine visit I agree to the above.       Cardiac Questionnaire:    Since your last visit or hospitalization:    1. Have you been having new or worsening chest pain? NO   2. Have you been having new or worsening shortness of breath? NO 3. Have you been having new or worsening leg swelling, wt gain, or increase in abdominal girth (pants fitting more tightly)? NO   4. Have you had any passing out spells? NO    *A YES to any of these questions would result in the appointment being kept. *If all the answers to these questions are NO, we should indicate that given the current situation regarding the worldwide coronarvirus pandemic, at the recommendation of the CDC, we are looking to limit gatherings in our waiting area, and thus will reschedule their appointment beyond four weeks from today.   _____________   COVID-19 Pre-Screening Questions:   Do you currently have a fever? NO  Have you recently travelled on a cruise, internationally, or to [place], [place], [place], [place], [place], or [place], [place] [employer])? NO  Have you been in contact with someone that is currently pending confirmation of Covid19 testing or has been confirmed to have the Covid19 virus?  NO  Are you currently experiencing fatigue or cough? NO

## 2018-10-25 NOTE — Patient Instructions (Signed)
Medication Instructions:  Your Physician recommend you continue on your current medication as directed.    If you need a refill on your cardiac medications before your next appointment, please call your pharmacy.   Lab work: None  Testing/Procedures: Our physician has recommended that you wear an 3 DAY ZIO-PATCH monitor. The Zio patch cardiac monitor continuously records heart rhythm data for up to 14 days, this is for patients being evaluated for multiple types heart rhythms. For the first 24 hours post application, please avoid getting the Zio monitor wet in the shower or by excessive sweating during exercise. After that, feel free to carry on with regular activities. Keep soaps and lotions away from the ZIO XT Patch.   This will be placed at our [facility] location - [address].      Follow-Up: Follow up appointment will be based of monitor report.

## 2018-10-28 ENCOUNTER — Telehealth: Payer: Self-pay | Admitting: Radiology

## 2018-10-28 NOTE — Telephone Encounter (Signed)
Enrolled patient for a 3 day Zio long Term monitor to be mailed. Brief instructions were gone over with patient and she knows to expect the monitor in 3-4 days.

## 2018-10-31 ENCOUNTER — Ambulatory Visit (INDEPENDENT_AMBULATORY_CARE_PROVIDER_SITE_OTHER): Payer: Medicaid Other

## 2018-10-31 DIAGNOSIS — R002 Palpitations: Secondary | ICD-10-CM

## 2018-11-16 ENCOUNTER — Other Ambulatory Visit: Payer: Self-pay

## 2018-11-28 LAB — OB RESULTS CONSOLE GBS: GBS: NEGATIVE

## 2018-12-19 NOTE — Telephone Encounter (Signed)
Opened in error

## 2018-12-27 ENCOUNTER — Other Ambulatory Visit: Payer: Self-pay | Admitting: Obstetrics and Gynecology

## 2018-12-28 ENCOUNTER — Other Ambulatory Visit: Payer: Self-pay | Admitting: Obstetrics and Gynecology

## 2018-12-28 ENCOUNTER — Inpatient Hospital Stay (HOSPITAL_COMMUNITY): Payer: Medicaid Other

## 2018-12-28 ENCOUNTER — Encounter (HOSPITAL_COMMUNITY): Payer: Self-pay

## 2018-12-28 ENCOUNTER — Other Ambulatory Visit: Payer: Self-pay

## 2018-12-28 ENCOUNTER — Inpatient Hospital Stay (HOSPITAL_COMMUNITY)
Admission: AD | Admit: 2018-12-28 | Discharge: 2018-12-31 | DRG: 807 | Disposition: A | Discharge status: Home / Self Care | Payer: Medicaid Other | Attending: Obstetrics and Gynecology | Admitting: Obstetrics and Gynecology

## 2018-12-28 DIAGNOSIS — O24420 Gestational diabetes mellitus in childbirth, diet controlled: Secondary | ICD-10-CM | POA: Diagnosis present

## 2018-12-28 DIAGNOSIS — E109 Type 1 diabetes mellitus without complications: Secondary | ICD-10-CM | POA: Diagnosis not present

## 2018-12-28 DIAGNOSIS — Z3A40 40 weeks gestation of pregnancy: Secondary | ICD-10-CM | POA: Diagnosis not present

## 2018-12-28 DIAGNOSIS — O9081 Anemia of the puerperium: Secondary | ICD-10-CM | POA: Diagnosis not present

## 2018-12-28 DIAGNOSIS — O480 Post-term pregnancy: Principal | ICD-10-CM | POA: Diagnosis present

## 2018-12-28 DIAGNOSIS — O2402 Pre-existing diabetes mellitus, type 1, in childbirth: Secondary | ICD-10-CM | POA: Diagnosis not present

## 2018-12-28 DIAGNOSIS — Z1159 Encounter for screening for other viral diseases: Secondary | ICD-10-CM | POA: Diagnosis not present

## 2018-12-28 DIAGNOSIS — O403XX0 Polyhydramnios, third trimester, not applicable or unspecified: Secondary | ICD-10-CM | POA: Diagnosis not present

## 2018-12-28 LAB — CBC
HCT: 34.6 % — ABNORMAL LOW (ref 36.0–46.0)
Hemoglobin: 10 g/dL — ABNORMAL LOW (ref 12.0–15.0)
MCH: 19 pg — ABNORMAL LOW (ref 26.0–34.0)
MCHC: 28.9 g/dL — ABNORMAL LOW (ref 30.0–36.0)
MCV: 65.8 fL — ABNORMAL LOW (ref 80.0–100.0)
Platelets: 230 10*3/uL (ref 150–400)
RBC: 5.26 MIL/uL — ABNORMAL HIGH (ref 3.87–5.11)
RDW: 16.9 % — ABNORMAL HIGH (ref 11.5–15.5)
WBC: 4.2 10*3/uL (ref 4.0–10.5)
nRBC: 0 % (ref 0.0–0.2)

## 2018-12-28 LAB — GLUCOSE, CAPILLARY
Glucose-Capillary: 105 mg/dL — ABNORMAL HIGH (ref 70–99)
Glucose-Capillary: 66 mg/dL — ABNORMAL LOW (ref 70–99)
Glucose-Capillary: 72 mg/dL (ref 70–99)
Glucose-Capillary: 68 mg/dL — ABNORMAL LOW (ref 70–99)

## 2018-12-28 LAB — TYPE AND SCREEN
ABO/RH(D): O POS
Antibody Screen: NEGATIVE

## 2018-12-28 LAB — SARS CORONAVIRUS 2 BY RT PCR (HOSPITAL ORDER, PERFORMED IN ~~LOC~~ HOSPITAL LAB): SARS Coronavirus 2: NEGATIVE

## 2018-12-28 MED ORDER — OXYTOCIN 40 UNITS IN NORMAL SALINE INFUSION - SIMPLE MED
2.5000 [IU]/h | INTRAVENOUS | Status: DC
  Filled 2018-12-28: qty 1000

## 2018-12-28 MED ORDER — LACTATED RINGERS IV SOLN: 500.0000 mL | INTRAVENOUS | Status: DC | PRN

## 2018-12-28 MED ORDER — LACTATED RINGERS IV SOLN
INTRAVENOUS | Status: DC
  Administered 2018-12-28 – 2018-12-29 (×4): via INTRAVENOUS

## 2018-12-28 MED ORDER — ACETAMINOPHEN 325 MG PO TABS: 650.0000 mg | ORAL_TABLET | ORAL | Status: DC | PRN

## 2018-12-28 MED ORDER — FLEET ENEMA 7-19 GM/118ML RE ENEM: 1.0000 | ENEMA | RECTAL | Status: DC | PRN

## 2018-12-28 MED ORDER — OXYTOCIN BOLUS FROM INFUSION: 500.0000 mL | Freq: Once | INTRAVENOUS | Status: DC

## 2018-12-28 MED ORDER — MISOPROSTOL 100 MCG PO TABS
25.0000 ug | ORAL_TABLET | ORAL | Status: DC
  Administered 2018-12-28 (×3): 25 ug via VAGINAL
  Filled 2018-12-28 (×3): qty 1

## 2018-12-28 MED ORDER — OXYCODONE-ACETAMINOPHEN 5-325 MG PO TABS: 2.0000 | ORAL_TABLET | ORAL | Status: DC | PRN

## 2018-12-28 MED ORDER — FENTANYL CITRATE (PF) 100 MCG/2ML IJ SOLN
INTRAMUSCULAR | Status: AC
  Filled 2018-12-28: qty 2

## 2018-12-28 MED ORDER — LIDOCAINE HCL (PF) 1 % IJ SOLN: 30.0000 mL | INTRAMUSCULAR | Status: DC | PRN

## 2018-12-28 MED ORDER — FENTANYL CITRATE (PF) 100 MCG/2ML IJ SOLN
100.0000 ug | INTRAMUSCULAR | Status: DC | PRN
  Administered 2018-12-28 (×4): 100 ug via INTRAVENOUS
  Filled 2018-12-28 (×4): qty 2

## 2018-12-28 MED ORDER — OXYCODONE-ACETAMINOPHEN 5-325 MG PO TABS: 1.0000 | ORAL_TABLET | ORAL | Status: DC | PRN

## 2018-12-28 MED ORDER — ONDANSETRON HCL 4 MG/2ML IJ SOLN: 4.0000 mg | Freq: Four times a day (QID) | INTRAMUSCULAR | Status: DC | PRN

## 2018-12-28 MED ORDER — SOD CITRATE-CITRIC ACID 500-334 MG/5ML PO SOLN: 30.0000 mL | ORAL | Status: DC | PRN

## 2018-12-28 MED ORDER — FENTANYL CITRATE (PF) 100 MCG/2ML IJ SOLN
100.0000 ug | INTRAMUSCULAR | Status: DC | PRN
  Administered 2018-12-28: 100 ug via INTRAVENOUS

## 2018-12-28 NOTE — H&P (Signed)
Morgan [name] is a 33 y.o. female, G3P2 at 39 weeks, EDD 12/28/18 confirmed by 1st trimester ultrasound,  presenting for IOL. Has received 2 doses of Cytotec and reports contractions every 2-3 minutes/40 seconds/intensity8/10  Pregnancy followed at [place] since 10+6  weeks and remarkable for:  1. Class A1 diabetes 2. Mild polyhydramnios: AFI 24.5 at 37 weeks. Resolved at 40 weeks 3. History of premature delivery at 36+5 days: patient declined 17 OHP 4. History of SGA: EFW 12/27/18 at 8 lbs 1 oz 57% with normal AFI at 22.8  OB History    Gravida  3   Para  2   Term  1   Preterm  1   AB      Living  2     SAB      TAB      Ectopic      Multiple      Live Births  2          Past Medical History:  Diagnosis Date  . Complication of anesthesia    Patient states when given the epidural the numbness went up to her nose.   . Diabetes mellitus without complication ([place])   . Hepatitis   . Hepatitis B carrier [facility])    Past Surgical History:  Procedure Laterality Date  . CARPAL TUNNEL RELEASE      Family History:  Non-contributory  Social History:    reports that she has never smoked. She has never used smokeless tobacco. She reports that she does not drink alcohol or use drugs.   Married, non-smoker, homemaker. 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  rate 18, height 5' 0.5" (1.537 m), weight 81.6 kg, currently breastfeeding.  General Appearance: Alert, appropriate appearance for age. No acute distress HEENT Exam: Grossly normal Chest/Respiratory Exam: Normal chest wall and respirations. Clear to auscultation  ardiovascular Exam: Regular rate and rhythm. S1, S2, no murmur Gastrointestinal Exam: soft, non-tender, Uterus gravid with size compatible with GA, Vertex presentation by Leopold's maneuvers Psychiatric Exam: Alert and oriented, appropriate affect  Cervical exam: 1+/50/-2 soft  Fetal tracings: Category 1     Assessment/Plan:  SIUP at 40 weeks with Class A1 diabetes for IOL Cytotec ripening followed by AROM SVD expected    Delsa Bern MD 12/28/2018, 10:53 AM

## 2018-12-28 NOTE — MAU Note (Signed)
Swab collected without difficulty. 

## 2018-12-28 NOTE — Progress Notes (Signed)
Patient more uncomfortable. Requesting pain medication. Would like epidural for delivery.  Plan 3rd Cytotec at 18:30 then AROM SVD expected

## 2018-12-28 NOTE — Progress Notes (Signed)
Patient ID: Morgan [name], female   DOB: 01/02/1986, 33 y.o.   MRN: [id number]  Pt c/o pain with contractions Cat 1 toco q 17min cx 1-2 /50/-3 Will start pitocin.  Pt has contractions q 2-3 min and would not want to have hyperstimulation with cytotec BS WNL Anticipate SVD

## 2018-12-28 NOTE — Progress Notes (Signed)
Patient spouse in bathroom> pt would like to empty bladder prior to vag exam and cytotec placement.

## 2018-12-29 ENCOUNTER — Encounter (HOSPITAL_COMMUNITY): Payer: Self-pay | Admitting: *Deleted

## 2018-12-29 ENCOUNTER — Inpatient Hospital Stay (HOSPITAL_COMMUNITY): Payer: Medicaid Other | Admitting: Anesthesiology

## 2018-12-29 DIAGNOSIS — E109 Type 1 diabetes mellitus without complications: Secondary | ICD-10-CM

## 2018-12-29 DIAGNOSIS — Z3A40 40 weeks gestation of pregnancy: Secondary | ICD-10-CM

## 2018-12-29 DIAGNOSIS — O403XX0 Polyhydramnios, third trimester, not applicable or unspecified: Secondary | ICD-10-CM

## 2018-12-29 DIAGNOSIS — O2402 Pre-existing diabetes mellitus, type 1, in childbirth: Secondary | ICD-10-CM

## 2018-12-29 LAB — GLUCOSE, CAPILLARY
Glucose-Capillary: 68 mg/dL — ABNORMAL LOW (ref 70–99)
Glucose-Capillary: 56 mg/dL — ABNORMAL LOW (ref 70–99)
Glucose-Capillary: 54 mg/dL — ABNORMAL LOW (ref 70–99)
Glucose-Capillary: 67 mg/dL — ABNORMAL LOW (ref 70–99)
Glucose-Capillary: 89 mg/dL (ref 70–99)

## 2018-12-29 LAB — ABO/RH: ABO/RH(D): O POS

## 2018-12-29 LAB — RPR: RPR Ser Ql: NONREACTIVE

## 2018-12-29 MED ORDER — EPHEDRINE 5 MG/ML INJ: 10.0000 mg | INTRAVENOUS | Status: DC | PRN

## 2018-12-29 MED ORDER — TETANUS-DIPHTH-ACELL PERTUSSIS 5-2.5-18.5 LF-MCG/0.5 IM SUSP: 0.5000 mL | Freq: Once | INTRAMUSCULAR | Status: DC

## 2018-12-29 MED ORDER — MEASLES, MUMPS & RUBELLA VAC IJ SOLR: 0.5000 mL | Freq: Once | INTRAMUSCULAR | Status: DC

## 2018-12-29 MED ORDER — ZOLPIDEM TARTRATE 5 MG PO TABS: 5.0000 mg | ORAL_TABLET | Freq: Every evening | ORAL | Status: DC | PRN

## 2018-12-29 MED ORDER — SODIUM CHLORIDE (PF) 0.9 % IJ SOLN
INTRAMUSCULAR | Status: DC | PRN
  Administered 2018-12-29: 12 mL/h via EPIDURAL

## 2018-12-29 MED ORDER — BENZOCAINE-MENTHOL 20-0.5 % EX AERO: 1.0000 "application " | INHALATION_SPRAY | CUTANEOUS | Status: DC | PRN

## 2018-12-29 MED ORDER — COCONUT OIL OIL
1.0000 "application " | TOPICAL_OIL | Status: DC | PRN
  Administered 2018-12-31: 1 via TOPICAL

## 2018-12-29 MED ORDER — ONDANSETRON HCL 4 MG/2ML IJ SOLN: 4.0000 mg | INTRAMUSCULAR | Status: DC | PRN

## 2018-12-29 MED ORDER — DEXTROSE IN LACTATED RINGERS 5 % IV SOLN
INTRAVENOUS | Status: DC
  Administered 2018-12-29: 07:00:00 via INTRAVENOUS

## 2018-12-29 MED ORDER — LACTATED RINGERS IV SOLN
500.0000 mL | Freq: Once | INTRAVENOUS | Status: AC
  Administered 2018-12-29: 500 mL via INTRAVENOUS

## 2018-12-29 MED ORDER — GLUCOSE 40 % PO GEL: 1.0000 | ORAL | Status: DC

## 2018-12-29 MED ORDER — DIPHENHYDRAMINE HCL 50 MG/ML IJ SOLN: 12.5000 mg | INTRAMUSCULAR | Status: DC | PRN

## 2018-12-29 MED ORDER — TERBUTALINE SULFATE 1 MG/ML IJ SOLN: 0.2500 mg | Freq: Once | INTRAMUSCULAR | Status: DC | PRN

## 2018-12-29 MED ORDER — PHENYLEPHRINE 40 MCG/ML (10ML) SYRINGE FOR IV PUSH (FOR BLOOD PRESSURE SUPPORT): 80.0000 ug | PREFILLED_SYRINGE | INTRAVENOUS | Status: DC | PRN

## 2018-12-29 MED ORDER — PRENATAL MULTIVITAMIN CH
1.0000 | ORAL_TABLET | Freq: Every day | ORAL | Status: DC
  Administered 2018-12-30 – 2018-12-31 (×2): 1 via ORAL
  Filled 2018-12-29 (×2): qty 1

## 2018-12-29 MED ORDER — IBUPROFEN 600 MG PO TABS
600.0000 mg | ORAL_TABLET | Freq: Four times a day (QID) | ORAL | Status: DC
  Administered 2018-12-29 – 2018-12-31 (×8): 600 mg via ORAL
  Filled 2018-12-29 (×9): qty 1

## 2018-12-29 MED ORDER — ONDANSETRON HCL 4 MG PO TABS: 4.0000 mg | ORAL_TABLET | ORAL | Status: DC | PRN

## 2018-12-29 MED ORDER — FERROUS SULFATE 325 (65 FE) MG PO TABS
325.0000 mg | ORAL_TABLET | Freq: Two times a day (BID) | ORAL | Status: DC
  Administered 2018-12-29 – 2018-12-31 (×4): 325 mg via ORAL
  Filled 2018-12-29 (×4): qty 1

## 2018-12-29 MED ORDER — GLUCOSE 4 G PO CHEW
CHEWABLE_TABLET | ORAL | Status: AC
  Filled 2018-12-29: qty 1

## 2018-12-29 MED ORDER — FENTANYL-BUPIVACAINE-NACL 0.5-0.125-0.9 MG/250ML-% EP SOLN
12.0000 mL/h | EPIDURAL | Status: DC | PRN
  Filled 2018-12-29: qty 250

## 2018-12-29 MED ORDER — DEXTROSE IN LACTATED RINGERS 5 % IV SOLN: INTRAVENOUS | Status: DC | PRN

## 2018-12-29 MED ORDER — OXYTOCIN 40 UNITS IN NORMAL SALINE INFUSION - SIMPLE MED
1.0000 m[IU]/min | INTRAVENOUS | Status: DC
  Administered 2018-12-29: 2 m[IU]/min via INTRAVENOUS

## 2018-12-29 MED ORDER — GLUCOSE 40 % PO GEL
ORAL | Status: AC
  Filled 2018-12-29: qty 1

## 2018-12-29 MED ORDER — DIBUCAINE (PERIANAL) 1 % EX OINT: 1.0000 "application " | TOPICAL_OINTMENT | CUTANEOUS | Status: DC | PRN

## 2018-12-29 MED ORDER — SENNOSIDES-DOCUSATE SODIUM 8.6-50 MG PO TABS
2.0000 | ORAL_TABLET | ORAL | Status: DC
  Administered 2018-12-30 (×2): 2 via ORAL
  Filled 2018-12-29 (×2): qty 2

## 2018-12-29 MED ORDER — SIMETHICONE 80 MG PO CHEW: 80.0000 mg | CHEWABLE_TABLET | ORAL | Status: DC | PRN

## 2018-12-29 MED ORDER — ACETAMINOPHEN 325 MG PO TABS
650.0000 mg | ORAL_TABLET | ORAL | Status: DC | PRN
  Administered 2018-12-29 (×2): 650 mg via ORAL
  Filled 2018-12-29 (×2): qty 2

## 2018-12-29 MED ORDER — LIDOCAINE HCL (PF) 1 % IJ SOLN
INTRAMUSCULAR | Status: DC | PRN
  Administered 2018-12-29: 6 mL via EPIDURAL
  Administered 2018-12-29: 5 mL via EPIDURAL

## 2018-12-29 MED ORDER — PHENYLEPHRINE 40 MCG/ML (10ML) SYRINGE FOR IV PUSH (FOR BLOOD PRESSURE SUPPORT)
80.0000 ug | PREFILLED_SYRINGE | INTRAVENOUS | Status: DC | PRN
  Filled 2018-12-29: qty 10

## 2018-12-29 MED ORDER — WITCH HAZEL-GLYCERIN EX PADS: 1.0000 "application " | MEDICATED_PAD | CUTANEOUS | Status: DC | PRN

## 2018-12-29 MED ORDER — DIPHENHYDRAMINE HCL 25 MG PO CAPS: 25.0000 mg | ORAL_CAPSULE | Freq: Four times a day (QID) | ORAL | Status: DC | PRN

## 2018-12-29 NOTE — Lactation Note (Signed)
This note was copied from a baby's chart. Lactation Consultation Note  Patient Name: Morgan [name] Date: 12/29/2018 Reason for consult: Initial assessment;Term;Maternal endocrine disorder;Other (Comment)(Hx of Hep+ but mom is currently negative.) Type of Endocrine Disorder?: Diabetes P3, 12 hour female infant, Mom with hx GDM and hep+ but mom is currently negative. Per mom, she attempted breastfeed her 34 year old but she never had colostrum infant was born premature at 75 weeks, she did breastfeed her 2nd child who is now 66 years old for two months. Mom is active on the [facility] program in [place]. She did have breast changes with this pregnancy and she feels breastfeeding is going well. Infant had two stools and one void since birth, LC changed a void and stool diaper while in room. Mom doesn't have breast pump at home, [facility] gave hand pump which mom will use prn, mom was  shown how to use harmony hand pump & how to disassemble, clean, & reassemble parts. Mom latched infant on right breast using the cross cradle hold, infant mouth was wide, tongue down and chin and nose touching breast. Infant was still breastfeeding (12 minutes) when LC left the room. Mom know to breastfeed according hunger cues, 8 to 12 times within 24 hours and on demand. Mom will continue doing STS while nursing infant. LC discussed infant will cluster feed at 24 hours of life. Reviewed Baby & Me book's Breastfeeding Basics.  Mom made aware of O/P services, breastfeeding support groups, community resources, and our phone # for post-discharge questions.   Maternal Data Formula Feeding for Exclusion: Yes Reason for exclusion: Mother's choice to formula and breast feed on admission Has patient been taught Hand Expression?: Yes(Mom taught back colostrum present) Does the patient have breastfeeding experience prior to this delivery?: Yes  Feeding Feeding Type: Breast Fed  LATCH Score Latch: Grasps breast  easily, tongue down, lips flanged, rhythmical sucking.  Audible Swallowing: A few with stimulation  Type of Nipple: Everted at rest and after stimulation  Comfort (Breast/Nipple): Soft / non-tender  Hold (Positioning): Assistance needed to correctly position infant at breast and maintain latch.  LATCH Score: 8  Interventions Interventions: Breast feeding basics reviewed;Breast compression;Assisted with latch;Adjust position;Hand pump;Skin to skin;Support pillows;Breast massage;Position options;Hand express;Expressed milk  Lactation Tools Discussed/Used WIC Program: Yes Pump Review: Setup, frequency, and cleaning;Milk Storage Initiated by:: Vicente Serene, IBCLC Date initiated:: 12/29/18   Consult Status Consult Status: Follow-up Date: 12/30/18 Follow-up type: In-patient    Vicente Serene 12/29/2018, 9:39 PM

## 2018-12-29 NOTE — Anesthesia Procedure Notes (Signed)
Epidural Patient location during procedure: OB Start time: 12/29/2018 12:40 AM End time: 12/29/2018 12:44 AM  Staffing Anesthesiologist: Lyn Hollingshead, MD Performed: anesthesiologist   Preanesthetic Checklist Completed: patient identified, site marked, surgical consent, pre-op evaluation, timeout performed, IV checked, risks and benefits discussed and monitors and equipment checked  Epidural Patient position: sitting Prep: site prepped and draped and DuraPrep Patient monitoring: continuous pulse ox and blood pressure Approach: midline Location: L3-L4 Injection technique: LOR air  Needle:  Needle type: Tuohy  Needle gauge: 17 G Needle length: 9 cm and 9 Needle insertion depth: 7 cm Catheter type: closed end flexible Catheter size: 19 Gauge Catheter at skin depth: 12 cm Test dose: negative and Other  Assessment Sensory level: T10 Events: blood not aspirated, injection not painful, no injection resistance, negative IV test and no paresthesia  Additional Notes Reason for block:procedure for pain

## 2018-12-29 NOTE — Anesthesia Postprocedure Evaluation (Signed)
Anesthesia Post Note  Patient: Morgan [name]  Procedure(s) Performed: AN AD [place]     Patient location during evaluation: Mother Baby Anesthesia Type: Epidural Level of consciousness: awake and alert and oriented Pain management: satisfactory to patient Vital Signs Assessment: post-procedure vital signs reviewed and stable Respiratory status: respiratory function stable Cardiovascular status: stable Postop Assessment: no headache, no backache, epidural receding, patient able to bend at knees, no signs of nausea or vomiting and adequate PO intake Anesthetic complications: no    Last Vitals:  Vitals:   12/29/18 1250 12/29/18 1645  BP: 101/71 100/75  Pulse: 86 (!) 58  Resp: 18 16  Temp: 37.1 C 36.9 C  SpO2: 98%     Last Pain:  Vitals:   12/29/18 1730  TempSrc:   PainSc: Asleep   Pain Goal: Patients Stated Pain Goal: 2 (12/29/18 1602)                 Morgan [name]

## 2018-12-29 NOTE — Anesthesia Preprocedure Evaluation (Signed)
Anesthesia Evaluation  Patient identified by MRN, date of birth, ID band Patient awake    Reviewed: Allergy & Precautions, H&P , NPO status , Patient's Chart, lab work & pertinent test results  History of Anesthesia Complications (+) history of anesthetic complications  Airway Mallampati: II  TM Distance: >3 FB Neck ROM: full    Dental no notable dental hx. (+) Teeth Intact   Pulmonary neg pulmonary ROS,    Pulmonary exam normal breath sounds clear to auscultation       Cardiovascular negative cardio ROS Normal cardiovascular exam Rhythm:regular Rate:Normal     Neuro/Psych negative neurological ROS  negative psych ROS   GI/Hepatic negative GI ROS, Neg liver ROS,   Endo/Other  negative endocrine ROS  Renal/GU negative Renal ROS     Musculoskeletal   Abdominal   Peds  Hematology  (+) Blood dyscrasia, anemia ,   Anesthesia Other Findings   Reproductive/Obstetrics (+) Pregnancy                             Anesthesia Physical Anesthesia Plan  ASA: II  Anesthesia Plan: Epidural   Post-op Pain Management:    Induction:   PONV Risk Score and Plan:   Airway Management Planned:   Additional Equipment:   Intra-op Plan:   Post-operative Plan:   Informed Consent: I have reviewed the patients History and Physical, chart, labs and discussed the procedure including the risks, benefits and alternatives for the proposed anesthesia with the patient or authorized representative who has indicated his/her understanding and acceptance.       Plan Discussed with:   Anesthesia Plan Comments:         Anesthesia Quick Evaluation

## 2018-12-29 NOTE — Progress Notes (Deleted)
Interpreter [phone] called and pt approved husband as interpreter. Pt aware she needs to wear mask when providers in room and in hall. Pt states it is too hot to wear but she will try. Pt aware FOB needs to leave for epidural via interpreter.

## 2018-12-29 NOTE — Progress Notes (Signed)
Hypoglycemic Event  CBG: 56  Treatment: Refused  Symptoms: Nervous/irritable   Possible Reasons for Event: Inadequate meal intake  Comments/MD notified: Dr. Charlesetta Garibaldi ordered D5/LR at 125 ml/hr since patient refused juice, regular soda, popsicle and glucose gel.     [name]

## 2018-12-30 LAB — CBC
HCT: 31 % — ABNORMAL LOW (ref 36.0–46.0)
Hemoglobin: 9 g/dL — ABNORMAL LOW (ref 12.0–15.0)
MCH: 19.3 pg — ABNORMAL LOW (ref 26.0–34.0)
MCHC: 29 g/dL — ABNORMAL LOW (ref 30.0–36.0)
MCV: 66.4 fL — ABNORMAL LOW (ref 80.0–100.0)
Platelets: 197 10*3/uL (ref 150–400)
RBC: 4.67 MIL/uL (ref 3.87–5.11)
RDW: 17 % — ABNORMAL HIGH (ref 11.5–15.5)
WBC: 4.2 10*3/uL (ref 4.0–10.5)
nRBC: 0 % (ref 0.0–0.2)

## 2018-12-30 LAB — GLUCOSE, CAPILLARY: Glucose-Capillary: 80 mg/dL (ref 70–99)

## 2018-12-30 NOTE — Addendum Note (Signed)
Addended by: Delsa Bern on: 12/30/2018 06:51 AM   Modules accepted: Orders

## 2018-12-30 NOTE — Progress Notes (Signed)
Post Partum Day 1 SVD Subjective:  Well. Lochia are normal. Voiding, ambulating, tolerating normal diet. feeding going well.  Objective: Blood pressure 100/78, pulse 90, temperature 98.3 F (36.8 C), temperature source Oral, resp. rate 18, height 5' 0.5" (1.537 m), weight 81.6 kg, SpO2 97 %, unknown if currently breastfeeding.  Physical Exam:  General: normal Lochia: appropriate Uterine Fundus: 0/1 firm non-tender  Extremities: No evidence of DVT seen on physical exam. Edema minimal    Recent Labs    12/28/18 0851 12/30/18 0640  HGB 10.0* 9.0*  HCT 34.6* 31.0*    Assessment/Plan: Normal Post-partum. Continue routine post-partum care. Anticipate discharge tomorrow Patient considering Mirena for contraception Baby for outpatient cicumcision     LOS: 2 days   Morgan [name] Morgan Amerman MD 12/30/2018, 11:39 AM

## 2018-12-30 NOTE — Lactation Note (Addendum)
This note was copied from a baby's chart. Lactation Consultation Note  Patient Name: Morgan [name] [id number] Date: 12/30/2018 Reason for consult: Follow-up assessment;Term;Maternal endocrine disorder Type of Endocrine Disorder?: Diabetes  P3 mother whose infant is now 57 hours old.  Mother did not breast feed her first child but breast fed her second child (now 34 years old) for 2 months.  Mother had no questions/concerns related to breast feeding.  She feels like her baby is latching well and denies pain with latching.  Encouraged to continue feeding 8-12 times/24 hours or sooner if baby shows cues.  Mother will call for lactation assistance as needed. She does not have a DEBP for home use but does not have a desire to obtain one.  Lab tech in to draw blood work on Sport and exercise psychologist.     Maternal Data Formula Feeding for Exclusion: No Has patient been taught Hand Expression?: Yes Does the patient have breastfeeding experience prior to this delivery?: Yes  Feeding Feeding Type: Breast Fed  LATCH Score Latch: Grasps breast easily, tongue down, lips flanged, rhythmical sucking.  Audible Swallowing: A few with stimulation  Type of Nipple: Everted at rest and after stimulation  Comfort (Breast/Nipple): Soft / non-tender  Hold (Positioning): No assistance needed to correctly position infant at breast.  LATCH Score: 9  Interventions    Lactation Tools Discussed/Used WIC Program: Yes   Consult Status Consult Status: Follow-up Date: 12/31/18 Follow-up type: In-patient    Morgan [name] 12/30/2018, 10:55 AM

## 2018-12-31 DIAGNOSIS — O9081 Anemia of the puerperium: Secondary | ICD-10-CM | POA: Diagnosis not present

## 2018-12-31 MED ORDER — IBUPROFEN 600 MG PO TABS: 600.0000 mg | ORAL_TABLET | Freq: Four times a day (QID) | ORAL | 0 refills | Status: DC

## 2018-12-31 NOTE — Lactation Note (Signed)
This note was copied from a baby's chart. Lactation Consultation Note  Patient Name: Morgan [name] [id number] Date: 12/31/2018 Reason for consult: Maternal endocrine disorder;Follow-up assessment;1st time breastfeeding;Term;Infant weight loss Type of Endocrine Disorder?: Diabetes(GDM)  68 hours old FT female who is being partially BF and formula fed by his mother, she's not a first time mom but this is her first time BF. RN Dola Argyle called because mom required assistance, she's been seen lactation throughout the day today; mom and baby are going home, baby is at 8% weight loss.  Mom trying to nurse baby when entering the room, but noticed that baby wasn't properly aligned, he didn't have a good positioning. LC tried the football hold and instructed mom how to hold baby's head. She was in a lot of pain when baby kept trying to latch to her nipple a couple of times (baby very eager to eat) but once LC positioned mom's hand behind baby's neck, shower her how to bring baby to the breast fast for a deep latch when he's having a big wide open mouth, this time mom said the pain was getting better and it wasn't as painful as before; audible swallows noted during the feeding; baby still nursing when exiting the room at the 5 minutes mark.  Noticed some abrasions on left nipple, instructed mom to use her colostrum after she was done with the feeding and to discontinue lanolin and use coconut oil instead; they have already been given a bottle of coconut oil by her RN and also comfort gels by lactation. Reviewed prevention of sore nipples again and stressed to mom the importance of positioning baby correctly to the breast. Mom aware of [facility] services and will contact PRN if needed.  Maternal Data    Feeding Feeding Type: Breast Fed  LATCH Score Latch: Grasps breast easily, tongue down, lips flanged, rhythmical sucking.  Audible Swallowing: A few with stimulation  Type of Nipple: Everted at rest and after  stimulation  Comfort (Breast/Nipple): Filling, red/small blisters or bruises, mild/mod discomfort  Hold (Positioning): Assistance needed to correctly position infant at breast and maintain latch.  LATCH Score: 7  Interventions Interventions: Breast feeding basics reviewed;Assisted with latch;Skin to skin;Breast massage;Hand express;Breast compression;Adjust position;Support pillows;Comfort gels  Lactation Tools Discussed/Used     Consult Status Consult Status: Complete Date: 12/31/18 Follow-up type: Call as needed    [place] 12/31/2018, 6:01 PM

## 2018-12-31 NOTE — Discharge Summary (Signed)
SVD OB Discharge Summary     Patient Name: Morgan [name] DOB: 08/28/1985 MRN: [id number]  Date of admission: 12/28/2018 Delivering MD: Morgan [name]  Date of delivery: 12/29/2018 Type of delivery: SVD  Newborn Data: Sex: Baby Female Circumcision: Out pt desired Live born female  Birth Weight: 7 lb 13.9 oz (3570 g) APGAR: 8, 9  Newborn Delivery   Birth date/time: 12/29/2018 09:24:00 Delivery type: Vaginal, Spontaneous      Feeding: breast and bottle Infant being discharge to home with mother in stable condition.   Admitting diagnosis: pregnancy Intrauterine pregnancy: 8827w1d     Secondary diagnosis:  Active Problems:   Indication for care in labor or delivery   Postpartum anemia   Normal postpartum course                                Complications: None                                                              Intrapartum Procedures: spontaneous vaginal delivery Postpartum Procedures: none Complications-Operative and Postpartum: none Augmentation: AROM, Pitocin and Cytotec   History of Present Illness: [name] is a 33 y.o. female, G3P2103, who presents at 5627w1d weeks gestation. The patient [facility]  Her pregnancy has been complicated by:  Patient Active Problem List   Diagnosis Date Noted  . Postpartum anemia 12/31/2018  . Normal postpartum course 12/31/2018  . Indication for care in labor or delivery 12/28/2018  . Heart palpitations 10/25/2018  . IBS (irritable bowel syndrome) 02/11/2014  . Iron deficiency anemia 01/05/2014  . Hepatitis B carrier (HCC) 12/11/2013  . Obesity (BMI 35.0-39.9 without comorbidity) 12/11/2013  . Hyperprolactinemia (HCC) 06/01/2013    Hospital course:  Induction of Labor With Vaginal Delivery   33 y.o. yo [id number] at 5427w1d was admitted to the hospital 12/28/2018 for induction of labor.  Indication for induction: Postdates and A1 DM.  Patient had an  uncomplicated labor course as follows: Membrane Rupture Time/Date: 9:24 AM ,12/29/2018   Intrapartum Procedures: Episiotomy: None [1]                                         Lacerations:  None [1]  Patient had delivery of a Viable infant.  Information for the patient's newborn:  Morgan [name], Boy Morgan [name] [[id number]][[id number]]  Delivery Method: Vaginal, Spontaneous(Filed from Delivery Summary)    12/29/2018  Details of delivery can be found in separate delivery note.  Patient had a routine postpartum course. Patient is discharged home 12/31/18. Postpartum Day # 3 : S/P NSVD due to IOL for post dates and GDM no meds, controlled with diet. Patient up ad lib, denies syncope or dizziness. Reports consuming regular diet without issues and denies N/V. Patient reports 0 bowel movement + passing flatus.  Denies issues with urination and reports bleeding is "lighter."  Patient is breast and bottle feeding and reports going well.  Desires IUD vs Mini Pill for postpartum contraception.  Pain is being appropriately managed with use of po meds.   Physical  exam  Vitals:   12/30/18 0538 12/30/18 1600 12/30/18 2258 12/31/18 0538  BP: 100/78 102/73 95/61 95/69   Pulse: 90 81 66 80  Resp: 18 16 18 18   Temp: 98.3 F (36.8 [name]) 98.7 F (37.1 [name]) 98.2 F (36.8 [name]) 98.3 F (36.8 [name])  TempSrc: Oral Oral Oral Oral  SpO2: 97% 98%  99%  Weight:      Height:       General: alert, cooperative and no distress Lochia: appropriate Uterine Fundus: firm Perineum: Intact DVT Evaluation: No evidence of DVT seen on physical exam. Negative Homan's sign. No cords or calf tenderness. No significant calf/ankle edema.  Labs: Lab Results  Component Value Date   WBC 4.2 12/30/2018   HGB 9.0 (L) 12/30/2018   HCT 31.0 (L) 12/30/2018   MCV 66.4 (L) 12/30/2018   PLT 197 12/30/2018   CMP Latest Ref Rng & Units 07/06/2016  Glucose 65 - 99 mg/dL 88  BUN 7 - 25 mg/dL 13  Creatinine 0.50 - 1.10 mg/dL 0.57  Sodium 135 - 146 mmol/L 138   Potassium 3.5 - 5.3 mmol/L 4.5  Chloride 98 - 110 mmol/L 103  CO2 20 - 31 mmol/L 26  Calcium 8.6 - 10.2 mg/dL 9.3  Total Protein 6.1 - 8.1 g/dL 7.1  Total Bilirubin 0.2 - 1.2 mg/dL 0.5  Alkaline Phos 33 - 115 U/L 75  AST 10 - 30 U/L 11  ALT 6 - 29 U/L 8    Date of discharge: 12/31/2018 Discharge Diagnoses: Post-date pregnancy and GDMA1 Discharge instruction: per After Visit Summary and "Baby and Me Booklet".  After visit meds:   Activity:           unrestricted and pelvic rest Advance as tolerated. Pelvic rest for 6 weeks.  Diet:                routine Medications: PNV, Ibuprofen, Colace and Iron Postpartum contraception: IUD Mirena Condition:  Pt discharge to home with baby in stable Anemia: PO Iron at home BID and with PNV GDMA1: No meds f/u at 6 weeks for 2 H GTT  Meds: Allergies as of 12/31/2018   No Known Allergies     Medication List    STOP taking these medications   promethazine 25 MG tablet Commonly known as: PHENERGAN     TAKE these medications   ibuprofen 600 MG tablet Commonly known as: ADVIL Take 1 tablet (600 mg total) by mouth every 6 (six) hours.   metoCLOPramide 10 MG tablet Commonly known as: REGLAN Take 10 mg by mouth daily as needed.   PRENATAL AD PO Take 1 tablet by mouth daily.       Discharge Follow Up:  Follow-up [facility] Follow up.   Specialty: Obstetrics and Gynecology Why: Please call anbd m,ake a circ appoitnemnt for your baby in 1 week then a 6 week PPV with 2 H GTT Contact information: [address] [place] [address] [place], NP-[name], CNM 12/31/2018, 10:39 AM  Morgan [name], Morgan [name]

## 2018-12-31 NOTE — Lactation Note (Signed)
This note was copied from a baby's chart. Lactation Consultation Note  Patient Name: Morgan [name] [id number] Date: 12/31/2018 Reason for consult: 1st time breastfeeding;Infant weight loss Type of Endocrine Disorder?: Diabetes  Baby is 67 hours old , 8 % weight loss,  As LC entered the room baby was latched in side lying on top of 2 pillows and mom mentioned  She had been feeding 20 mins. Baby  Was  Latched shallow and mostly latched onto the nipple.  LC had mom release baby, removed the 2 pillows baby was lying on and showed mom how he  Should safely be latched in the side lying position. Baby opened wide and LC assisted to latch  Baby with depth, swallows noted and showed mom breast compressions to enhance flow to baby.  Baby fed on the left breast 15 mins and was hanging out. LC had mom release suction.  LC noted full nodules lateral aspects of both breast.  LC assisted mom to latch on the right breast / side lying/ and baby latched easily.  Per mom having discomfort until depth achieved. LC noted edema at the base of the nipple.  Mom was able to get comfortable with LC easing chin, baby still feeding at 9 mins with swallows,  And full nodules improving.  LC recommended comfort gels after feedings alternating with breast shells.  LC also recommended due to the 8 % weight loss - today prior to the 1st latch - breast massage,  Hand express, pre-pump with hand pump to prime the milk ducts.  Sore nipple and engorgement prevention and tx reviewed.  LC instructed mom on the use comfort gels ( not to mix with coconut oil ) and coconut oil with shells.   Mom aware of the [facility] resources after D/c for [facility] and the [facility].   LC gave report to [place]       Maternal Data Has patient been taught Hand Expression?: Yes  Feeding Feeding Type: Breast Fed(right breast / sidelying)  LATCH Score Latch: Grasps breast easily, tongue down, lips flanged, rhythmical  sucking.  Audible Swallowing: Spontaneous and intermittent  Type of Nipple: Everted at rest and after stimulation  Comfort (Breast/Nipple): Filling, red/small blisters or bruises, mild/mod discomfort  Hold (Positioning): Assistance needed to correctly position infant at breast and maintain latch.  LATCH Score: 8  Interventions Interventions: Breast feeding basics reviewed;Assisted with latch;Skin to skin;Breast massage;Hand express;Breast compression;Adjust position;Support pillows;Position options  Lactation Tools Discussed/Used Tools: Shells;Pump;Comfort gels;Coconut oil;Flanges Flange Size: 24;27 Shell Type: Inverted Breast pump type: Manual WIC Program: Yes Pump Review: Milk Storage   Consult Status Consult Status: Complete Date: 12/31/18 Follow-up type: In-patient    [place] 12/31/2018, 9:23 AM

## 2019-01-03 ENCOUNTER — Telehealth (HOSPITAL_COMMUNITY): Payer: Self-pay | Admitting: Lactation Services

## 2019-01-03 ENCOUNTER — Ambulatory Visit: Payer: Medicaid Other | Admitting: Cardiology

## 2019-01-03 NOTE — Telephone Encounter (Signed)
[facility] called regarding mother asking for medication that begins with a "D".   No notes are in our chart regarding this conversation.  Suggest she call patient and ask if she talked with someone at [facility].

## 2019-05-15 ENCOUNTER — Other Ambulatory Visit: Payer: Self-pay

## 2019-05-15 DIAGNOSIS — Z20822 Contact with and (suspected) exposure to covid-19: Secondary | ICD-10-CM

## 2019-05-17 LAB — NOVEL CORONAVIRUS, NAA: SARS-CoV-2, NAA: NOT DETECTED

## 2019-10-11 ENCOUNTER — Ambulatory Visit: Payer: Medicaid Other | Admitting: Family Medicine

## 2019-10-11 ENCOUNTER — Encounter: Payer: Self-pay | Admitting: Family Medicine

## 2019-10-11 ENCOUNTER — Other Ambulatory Visit: Payer: Self-pay

## 2019-10-11 ENCOUNTER — Ambulatory Visit (HOSPITAL_COMMUNITY)
Admission: RE | Admit: 2019-10-11 | Discharge: 2019-10-11 | Disposition: A | Discharge status: Home / Self Care | Payer: Medicaid Other | Source: Ambulatory Visit | Attending: Family Medicine | Admitting: Family Medicine

## 2019-10-11 VITALS — BP 135/80 | HR 67 | Temp 98.0°F | Ht 60.5 in | Wt 191.0 lb

## 2019-10-11 DIAGNOSIS — E559 Vitamin D deficiency, unspecified: Secondary | ICD-10-CM | POA: Diagnosis not present

## 2019-10-11 DIAGNOSIS — G5603 Carpal tunnel syndrome, bilateral upper limbs: Secondary | ICD-10-CM | POA: Diagnosis not present

## 2019-10-11 DIAGNOSIS — G5601 Carpal tunnel syndrome, right upper limb: Secondary | ICD-10-CM | POA: Diagnosis not present

## 2019-10-11 DIAGNOSIS — R002 Palpitations: Secondary | ICD-10-CM | POA: Diagnosis not present

## 2019-10-11 DIAGNOSIS — D509 Iron deficiency anemia, unspecified: Secondary | ICD-10-CM

## 2019-10-11 DIAGNOSIS — Z131 Encounter for screening for diabetes mellitus: Secondary | ICD-10-CM | POA: Diagnosis not present

## 2019-10-11 DIAGNOSIS — Z1159 Encounter for screening for other viral diseases: Secondary | ICD-10-CM | POA: Diagnosis not present

## 2019-10-11 DIAGNOSIS — R768 Other specified abnormal immunological findings in serum: Secondary | ICD-10-CM | POA: Diagnosis not present

## 2019-10-11 DIAGNOSIS — M2142 Flat foot [pes planus] (acquired), left foot: Secondary | ICD-10-CM | POA: Diagnosis not present

## 2019-10-11 DIAGNOSIS — Z8639 Personal history of other endocrine, nutritional and metabolic disease: Secondary | ICD-10-CM | POA: Diagnosis not present

## 2019-10-11 DIAGNOSIS — M545 Low back pain: Secondary | ICD-10-CM

## 2019-10-11 DIAGNOSIS — Z0000 Encounter for general adult medical examination without abnormal findings: Secondary | ICD-10-CM | POA: Diagnosis not present

## 2019-10-11 DIAGNOSIS — M5441 Lumbago with sciatica, right side: Secondary | ICD-10-CM

## 2019-10-11 DIAGNOSIS — M2141 Flat foot [pes planus] (acquired), right foot: Secondary | ICD-10-CM | POA: Diagnosis not present

## 2019-10-11 DIAGNOSIS — G8929 Other chronic pain: Secondary | ICD-10-CM

## 2019-10-11 DIAGNOSIS — Z8619 Personal history of other infectious and parasitic diseases: Secondary | ICD-10-CM | POA: Diagnosis not present

## 2019-10-11 DIAGNOSIS — Z130 Encounter for screening for diseases of the blood and blood-forming organs and certain disorders involving the immune mechanism: Secondary | ICD-10-CM | POA: Diagnosis not present

## 2019-10-11 DIAGNOSIS — Z13220 Encounter for screening for lipoid disorders: Secondary | ICD-10-CM

## 2019-10-11 LAB — POCT GLYCOSYLATED HEMOGLOBIN (HGB A1C): Hemoglobin A1C: 5.1 % (ref 4.0–5.6)

## 2019-10-11 NOTE — Patient Instructions (Addendum)
It was a pleasure to meet you today! We are doing many things:  1. I referred you to hand surgery for your carpal tunnel symptoms, PT for your low back pain, and podiatry for flat feet. All three of these providers should call you to schedule an appointment.  2. For the heart palpitations, since you have seen Dr. Cristal Deer in the past, I recommend you call her office and schedule an appt to do another holter monitor (hopefully longer) since you still have heart palpitations. Your ekg was reassuringly normal today.  3. Follow up with the pap smear for completeness this year.  4. We did a lot of blood work today. If anything is abnormal, I will call you to discuss.  5. You can follow up as needed for any new concerns or to discuss any other problem we reviewed today.  Be Well!  Dr. Leary Roca

## 2019-10-12 ENCOUNTER — Encounter: Payer: Self-pay | Admitting: Family Medicine

## 2019-10-12 ENCOUNTER — Telehealth: Payer: Self-pay | Admitting: Family Medicine

## 2019-10-12 DIAGNOSIS — Z131 Encounter for screening for diabetes mellitus: Secondary | ICD-10-CM | POA: Insufficient documentation

## 2019-10-12 DIAGNOSIS — G8929 Other chronic pain: Secondary | ICD-10-CM | POA: Insufficient documentation

## 2019-10-12 DIAGNOSIS — Z13220 Encounter for screening for lipoid disorders: Secondary | ICD-10-CM | POA: Insufficient documentation

## 2019-10-12 DIAGNOSIS — M2140 Flat foot [pes planus] (acquired), unspecified foot: Secondary | ICD-10-CM | POA: Insufficient documentation

## 2019-10-12 DIAGNOSIS — Z0000 Encounter for general adult medical examination without abnormal findings: Secondary | ICD-10-CM | POA: Insufficient documentation

## 2019-10-12 DIAGNOSIS — E559 Vitamin D deficiency, unspecified: Secondary | ICD-10-CM | POA: Insufficient documentation

## 2019-10-12 DIAGNOSIS — G5603 Carpal tunnel syndrome, bilateral upper limbs: Secondary | ICD-10-CM | POA: Insufficient documentation

## 2019-10-12 LAB — HEPATITIS B SURFACE ANTIGEN: Hepatitis B Surface Ag: NEGATIVE

## 2019-10-12 LAB — CBC WITH DIFFERENTIAL/PLATELET
Basophils Absolute: 0 10*3/uL (ref 0.0–0.2)
Basos: 1 %
EOS (ABSOLUTE): 0.1 10*3/uL (ref 0.0–0.4)
Eos: 4 %
Hematocrit: 40.9 % (ref 34.0–46.6)
Hemoglobin: 13.1 g/dL (ref 11.1–15.9)
Immature Grans (Abs): 0 10*3/uL (ref 0.0–0.1)
Immature Granulocytes: 0 %
Lymphocytes Absolute: 2.1 10*3/uL (ref 0.7–3.1)
Lymphs: 58 %
MCH: 23.5 pg — ABNORMAL LOW (ref 26.6–33.0)
MCHC: 32 g/dL (ref 31.5–35.7)
MCV: 73 fL — ABNORMAL LOW (ref 79–97)
Monocytes Absolute: 0.2 10*3/uL (ref 0.1–0.9)
Monocytes: 7 %
Neutrophils Absolute: 1.1 10*3/uL — ABNORMAL LOW (ref 1.4–7.0)
Neutrophils: 30 %
Platelets: 309 10*3/uL (ref 150–450)
RBC: 5.57 x10E6/uL — ABNORMAL HIGH (ref 3.77–5.28)
RDW: 13.8 % (ref 11.7–15.4)
WBC: 3.6 10*3/uL (ref 3.4–10.8)

## 2019-10-12 LAB — COMPREHENSIVE METABOLIC PANEL
ALT: 11 IU/L (ref 0–32)
AST: 10 IU/L (ref 0–40)
Albumin/Globulin Ratio: 1.7 (ref 1.2–2.2)
Albumin: 4.4 g/dL (ref 3.8–4.8)
Alkaline Phosphatase: 70 IU/L (ref 39–117)
BUN/Creatinine Ratio: 32 — ABNORMAL HIGH (ref 9–23)
BUN: 18 mg/dL (ref 6–20)
Bilirubin Total: 0.3 mg/dL (ref 0.0–1.2)
CO2: 19 mmol/L — ABNORMAL LOW (ref 20–29)
Calcium: 9 mg/dL (ref 8.7–10.2)
Chloride: 101 mmol/L (ref 96–106)
Creatinine, Ser: 0.56 mg/dL — ABNORMAL LOW (ref 0.57–1.00)
GFR calc Af Amer: 141 mL/min/{1.73_m2} (ref >59)
GFR calc non Af Amer: 122 mL/min/{1.73_m2} (ref >59)
Globulin, Total: 2.6 g/dL (ref 1.5–4.5)
Glucose: 85 mg/dL (ref 65–99)
Potassium: 4.8 mmol/L (ref 3.5–5.2)
Sodium: 137 mmol/L (ref 134–144)
Total Protein: 7 g/dL (ref 6.0–8.5)

## 2019-10-12 LAB — LIPID PANEL
Chol/HDL Ratio: 2.7 ratio (ref 0.0–4.4)
Cholesterol, Total: 197 mg/dL (ref 100–199)
HDL: 72 mg/dL (ref >39)
LDL Chol Calc (NIH): 114 mg/dL — ABNORMAL HIGH (ref 0–99)
Triglycerides: 61 mg/dL (ref 0–149)
VLDL Cholesterol Cal: 11 mg/dL (ref 5–40)

## 2019-10-12 LAB — HEPATITIS B CORE ANTIBODY, TOTAL: Hep B Core Total Ab: POSITIVE — AB

## 2019-10-12 LAB — VITAMIN D 25 HYDROXY (VIT D DEFICIENCY, FRACTURES): Vit D, 25-Hydroxy: 17.4 ng/mL — ABNORMAL LOW (ref 30.0–100.0)

## 2019-10-12 LAB — HEPATITIS B SURFACE ANTIBODY, QUANTITATIVE: Hepatitis B Surf Ab Quant: 21.2 m[IU]/mL (ref >9.9)

## 2019-10-12 NOTE — Assessment & Plan Note (Signed)
CBC obtained today to assess anemia.

## 2019-10-12 NOTE — Assessment & Plan Note (Signed)
Patient s/p release of Left wrist, has tried conservative treatment and has increasing symptoms in R wrist and would like to be referred to hand surgeon. Distribution of numbness/tingling is a combination ulnar and medial nerve (third digit to 5th digit). However, patient states she had previous EMG which confirmed carpal tunnel syndrome. - Referral to hand surgery placed

## 2019-10-12 NOTE — Assessment & Plan Note (Addendum)
-   pap smear: patient reports that she has her pap smears performed at OB/GYN, scheduled for in May for repeat pap smear - lipid panel obtained today to screen for hyperlipidemia, BMI >35

## 2019-10-12 NOTE — Assessment & Plan Note (Signed)
Patient still having intermittent symptoms of heart palpitations and tachycardia to 160. Will obtain EKG today for baseline. Reviewed and NSR, HR 63. Low voltage possibly due to habitus. - recommend patient schedule follow up appt with Dr. Cristal Deer for possible longer monitoring if she would like this further investigated - reassured her that baseline EKG is normal.

## 2019-10-12 NOTE — Telephone Encounter (Signed)
Discussed lab results with patient. Total cholesterol is ok, could see some improvement on LDL, which would come from diet and exercise. Kidney function, liver function, and electrolytes WNL. No diabetes or prediabetes. She is not a hepatitis B carrier, but had a natural infection, which is now cleared, and she has immunity to Hepatitis B. She does have vitamin D deficiency. Recommended Vit D 3 1000U daily and we can recheck in 3 months.  Shirlean Mylar, MD [facility], PGY-1

## 2019-10-12 NOTE — Progress Notes (Signed)
SUBJECTIVE:   CHIEF COMPLAINT / HPI: establish care  PMH: GERD, irregular heart beat with tachycardia, carpal tunnel bilateral, chronic low back pain, pes planus, positive Hep B surface ag in 2014 and 2016 PSH: Left carpal tunnel release in 2018 Allergies: NKDA Meds: none Family Hx: consanguinity, parents are cousins.  Strong family hx on both sides of HTN. Mother, aunts, grandparents on maternal side have asthma. Sister has kidney agenesis and Mayer-Rokitansky-Kuster-Hauser syndrome. Social hx: Patient is a stay at home parent to 3 children, youngest daughter is 45 months old. Patient is a physician, GP, IMG, cannot practice here, but would like to one day take her Step exams. She lives with her husband and 3 children.  Hepatitis B surface ag positive: Patient had positive Hep B surface antigen in 2014, as well as positive Hep B e antibody in 2014. At that time, Hepatitis B core antibody was negative. CMP has been unremarkable in the past. Patient was diagnosed with hepatitis B carrier, however, this could have been natural infection. Will obtain correct labs to ascertain Hepatitis B status: hepatitis B core antibody total, hepatitis B surface antibody total, and hepatitis B surface antigen. This will help differentiate between active infection, chronic carrier status, or natural resolved infection with or without immunity.  R carpal tunnel: in the last 2 months patient has had increasing numbness, tingling, and nigh time pain in her R wrist and fingers, distribution is from third digit to medial side of 5th digit. She previously had a Left hand carpal tunnel release in 2018 and reports that she had previous electromyography that was positive on R hand for median nerve, with also some ulnar distribution as well. She has been wearing splints at night and still has increasing symptoms. She would like referral to a hand surgeon.   Palpitations: patient has had several year history of tachycardia to  160 bpm with palpitations. She has previously had a 3-day Zio patch, which did not catch the symptomatic episodes she intermittently has. She has previously seen Dr. Buford Dresser 1 year ago. She does not have symptoms today, but no EKG on file. Will obtain EKG today for baseline.    Chronic low back pain: patient has had chronic low back pain which has worsened recently. The pain is mostly R sided and can be sharp and shoot down her leg to the knee. No saddle anesthesia or incontinence. She has seen a chiropractor in the past, and wishes to be referred to physical therapy today.  Flat feet: patient recently saw a chiropractor who noted she had flat fleet, which she reports may be contributing to back pain. She requests a referral to podiatry for evaluation and treatment.  PERTINENT  PMH / PSH: carpal tunnel, GERD, palpitations, chronic low back pain, pes planus  OBJECTIVE:   BP 135/80   Pulse 67   Temp 98 F (36.7 C) (Oral)   Ht 5' 0.5" (1.537 m)   Wt 191 lb (86.6 kg)   LMP 10/02/2019   SpO2 95%   BMI 36.69 kg/m   Physical Exam Vitals and nursing note reviewed.  Constitutional:      General: She is not in acute distress.    Appearance: Normal appearance. She is obese. She is not ill-appearing, toxic-appearing or diaphoretic.  HENT:     Head: Normocephalic and atraumatic.  Eyes:     Conjunctiva/sclera: Conjunctivae normal.  Cardiovascular:     Rate and Rhythm: Normal rate and regular rhythm.  Pulses: Normal pulses.     Heart sounds: Normal heart sounds. No murmur. No friction rub. No gallop.   Pulmonary:     Effort: Pulmonary effort is normal.     Breath sounds: Normal breath sounds. No wheezing, rhonchi or rales.  Abdominal:     General: Abdomen is flat. Bowel sounds are normal. There is no distension.     Palpations: Abdomen is soft.     Tenderness: There is no abdominal tenderness.  Musculoskeletal:        General: No swelling, tenderness or signs of injury.  Normal range of motion.     Cervical back: Neck supple.     Right lower leg: No edema.     Left lower leg: No edema.     Comments: Lumbar spine: no tenderness to palpation on bilateral paraspinal muscles, no stepoffs. SLR negative bilaterally.  Skin:    General: Skin is warm and dry.  Neurological:     General: No focal deficit present.     Mental Status: She is alert and oriented to person, place, and time. Mental status is at baseline.     Sensory: Sensory deficit present.     Motor: No weakness.     Coordination: Coordination normal.     Gait: Gait normal.     Deep Tendon Reflexes: Reflexes normal.     Comments: R Hand: full ROM in wrist, no erythema/edema/ecchymosis, no TTP in styloids, diminished sensation on dorsal aspect of digits 3-5, tinel sign positive, finkelstein test negative  L hand: full ROM in wrist, no TTP in styloids, intact sensation across all digits, tinel sign negative, finkelstein test negative  UEs: bilateral DTRs intact  Psychiatric:        Mood and Affect: Mood normal.        Behavior: Behavior normal.    ASSESSMENT/PLAN:  Mrs. Rahmah Mccamy is a 34 yo woman here to establish care.  History of hepatitis B To differentiate between chronic carrier status, natural infection with or without immunity, will obtain the following labs: - hepatitis B core antibody total - hepatitis B surface antibody total - hepatitis B surface antigen - CMP  Iron deficiency anemia CBC obtained today to assess anemia.  Heart palpitations Patient still having intermittent symptoms of heart palpitations and tachycardia to 160. Will obtain EKG today for baseline. Reviewed and NSR, HR 63. Low voltage possibly due to habitus. - recommend patient schedule follow up appt with Dr. Cristal Deer for possible longer monitoring if she would like this further investigated - reassured her that baseline EKG is normal.  Carpal tunnel syndrome, bilateral Patient s/p release of Left wrist,  has tried conservative treatment and has increasing symptoms in R wrist and would like to be referred to hand surgeon. Distribution of numbness/tingling is a combination ulnar and medial nerve (third digit to 5th digit). However, patient states she had previous EMG which confirmed carpal tunnel syndrome. - Referral to hand surgery placed  Chronic low back pain with right-sided sciatica Patient reports sciatica on R side that is worsening and would like to see physical therapy. No red flag symptoms. - referral to physical therapy placed  Pes planus Patient requests referral to podiatry for pes planus. - referral placed  Vitamin D deficiency Patient previously with low vitamin d, wishes to have level assessed today.  - Vitamin D lab  Healthcare maintenance - pap smear: patient reports that she has her pap smears performed at OB/GYN, scheduled for in May for repeat pap smear -  lipid panel obtained today to screen for hyperlipidemia, BMI >35   Diabetes mellitus screening Patient does not have symptoms of polydipsia, polyuria, does have BMI >35, requests DM screening. -Hgb A1c performed today.    Shirlean Mylar, MD [facility]

## 2019-10-12 NOTE — Assessment & Plan Note (Signed)
Patient does not have symptoms of polydipsia, polyuria, does have BMI >35, requests DM screening. -Hgb A1c performed today.

## 2019-10-12 NOTE — Assessment & Plan Note (Signed)
Patient reports sciatica on R side that is worsening and would like to see physical therapy. No red flag symptoms. - referral to physical therapy placed

## 2019-10-12 NOTE — Assessment & Plan Note (Signed)
Patient previously with low vitamin d, wishes to have level assessed today.  - Vitamin D lab

## 2019-10-12 NOTE — Assessment & Plan Note (Addendum)
To differentiate between chronic carrier status, natural infection with or without immunity, will obtain the following labs: - hepatitis B core antibody total - hepatitis B surface antibody total - hepatitis B surface antigen - CMP

## 2019-10-12 NOTE — Assessment & Plan Note (Signed)
Patient requests referral to podiatry for pes planus. - referral placed

## 2019-10-31 ENCOUNTER — Ambulatory Visit: Payer: Medicaid Other | Attending: Family Medicine | Admitting: Physical Therapy

## 2019-10-31 ENCOUNTER — Other Ambulatory Visit: Payer: Self-pay

## 2019-10-31 ENCOUNTER — Encounter: Payer: Self-pay | Admitting: Physical Therapy

## 2019-10-31 DIAGNOSIS — M6281 Muscle weakness (generalized): Secondary | ICD-10-CM | POA: Diagnosis present

## 2019-10-31 DIAGNOSIS — R293 Abnormal posture: Secondary | ICD-10-CM | POA: Insufficient documentation

## 2019-10-31 DIAGNOSIS — G8929 Other chronic pain: Secondary | ICD-10-CM | POA: Diagnosis present

## 2019-10-31 DIAGNOSIS — R252 Cramp and spasm: Secondary | ICD-10-CM | POA: Diagnosis present

## 2019-10-31 DIAGNOSIS — M5441 Lumbago with sciatica, right side: Secondary | ICD-10-CM | POA: Insufficient documentation

## 2019-10-31 NOTE — Therapy (Signed)
[facility] Outpatient Rehabilitation [facility] [address] [place], [place], [address] Phone: [phone]   Fax:  [phone]  Physical Therapy Evaluation  Patient Details  Name: Morgan [name] MRN: [id number] Date of Birth: 08/06/1985 Referring Provider (PT): Terisa Starr MD   Encounter Date: 10/31/2019  PT End of Session - 10/31/19 1711    Visit Number  1    Number of Visits  4    Date for PT Re-Evaluation  11/28/19    Authorization Type  MCD   1st authorization sent 10-31-19    Authorization - Visit Number  1    Authorization - Number of Visits  4    PT Start Time  1600    PT Stop Time  1646    PT Time Calculation (min)  46 min    Activity Tolerance  Patient tolerated treatment well    Behavior During Therapy  [facility] for tasks assessed/performed       Past Medical History:  Diagnosis Date  . Complication of anesthesia    Patient states when given the epidural the numbness went up to her nose.   . Diabetes mellitus without complication (HCC)   . Hepatitis   . Hepatitis B carrier [facility])     Past Surgical History:  Procedure Laterality Date  . CARPAL TUNNEL RELEASE      There were no vitals filed for this visit.   Subjective Assessment - 10/31/19 1602    Subjective  I have had back pain for 2 years  ago but it has gotten worse in the last  43months.  No incident, I just woke up and I was having more intense pain throughout the day.  I have 3  children and last one is 10 month.  I sometimes cannot pick up my little son from the floor.   i sometimes have flares that last 5 days and I cannot take care of my 3 boys    Pertinent History  carpal tunnel, IBS, obesity, chronic low back pain for 2 years.  pes planus    How long can you sit comfortably?  Relief when i sitting    How long can you stand comfortably?  worse when I bend over  < 2 minutes    How long can you walk comfortably?  <    Diagnostic tests  x ray at chiropractore    Currently in  Pain?  Yes    Pain Score  3    at worst 9/10 when flaring   Pain Location  Back    Pain Orientation  Mid;Right;Left    Pain Descriptors / Indicators  Sharp    Pain Type  Chronic pain    Pain Radiating Towards  radiates to the hip inside the hip bone    Pain Onset  More than a month ago   3 months recent and for past 2 years   Pain Frequency  Intermittent    Aggravating Factors   bending over,  sudden movements  , carrying groceries         [facility] PT Assessment - 10/31/19 0001      Assessment   Medical Diagnosis  chronic low back pain    Referring Provider (PT)  Terisa Starr MD    Onset Date/Surgical Date  08/03/19    Hand Dominance  Right    Prior Therapy  None      Precautions   Precautions  None      Restrictions  Weight Bearing Restrictions  No      Balance Screen   Has the patient fallen in the past 6 months  No    Has the patient had a decrease in activity level because of a fear of falling?   No    Is the patient reluctant to leave their home because of a fear of falling?   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 youngest 7 months      Cognition   Overall Cognitive Status  Within Functional Limits for tasks assessed      Observation/Other Assessments   Focus on Therapeutic Outcomes (FOTO)   MCD NA      Sensation   Light Touch  Appears Intact      Functional Tests   Functional tests  Squat;Lunges;Sit to Stand      Squat   Comments  able to squat 60 degree hip flexion with heels on ground but no more      Lunges   Comments  decreased functional strength and coordination  especially returning to standing      Sit to Stand   Comments  5 x STS 10.97   pt must use UE support to  floor/stand     Posture/Postural Control   Posture/Postural Control  Postural limitations    Postural Limitations  Increased lumbar lordosis    Posture Comments  lumbar slight scoliosis      AROM   Overall AROM   Deficits    Overall AROM Comments   Hip IR/ER hypermobility     Lumbar Flexion  90%    able to touch floor   Lumbar Extension  70 %   ERP   Lumbar - Right Side Bend  80%    Lumbar - Left Side Bend  50%   hurts on RT side   Lumbar - Right Rotation  WFL    Lumbar - Left Rotation  WFL      Strength   Overall Strength  Deficits    Right Hip Flexion  4-/5    Right Hip Extension  3-/5    Right Hip ABduction  3-/5    Left Hip Flexion  4+/5    Left Hip Extension  4/5    Left Hip ABduction  4/5    Right Knee Flexion  4/5    Right Knee Extension  4/5    Left Knee Flexion  4+/5    Left Knee Extension  4+/5    Right Ankle Dorsiflexion  4+/5    Left Ankle Dorsiflexion  5/5      Palpation   Palpation comment  tenderness over the Lumbar 4/5 and RT SI jt and over RT piriformis                Objective measurements completed on examination: See above findings.      [facility] PT Treatment/Exercise - 10/31/19 0001      Lumbar Exercises: Stretches   Quadruped Mid Back Stretch  5 reps;30 seconds    Quadruped Mid Back Stretch Limitations  forward, side to side  Piriformis Stretch  Right;3 reps;30 seconds    Piriformis Stretch Limitations  Rt ankle on LT knee and knee to chest      Lumbar Exercises: Supine   Ab Set  10 reps    AB Set Limitations  10 sec    Pelvic Tilt  10 reps    Clam  10 reps;3 seconds;4 seconds    Clam Limitations  RTand LT    Bent Knee Raise  10 reps;3 seconds             PT Education - 10/31/19 1658    Education Details  POC Explanation of findings   Intial HEP    Person(s) Educated  Patient    Methods  Explanation;Demonstration;Tactile cues;Verbal cues;Handout    Comprehension  Verbalized understanding;Returned demonstration        PT Short Term Goals - 10/31/19 1633      PT SHORT TERM GOAL #1   Title  Pt will be independent with iniital HEP and abdominal engagement    Baseline  no knowledge    Time  3    Period  Weeks    Status  New    Target Date  11/28/19      PT SHORT TERM GOAL #2   Title  Pt will  be educated on proper body mechanics and lifting in order to  care for 3 boys at home and household chores    Baseline  Pt with no knowledged of good body mechanics and exhibits compensations    Time  3    Period  Weeks    Status  New    Target Date  11/28/19      PT SHORT TERM GOAL #3   Title  Pt will improved Hip MMT to at least 4-/5 in order to perform basic squats with good form and no heel lifting or compensations    Baseline  Pt with Rt hip ext/hip abd 3-/5    Time  3    Period  Weeks    Status  New    Target Date  11/28/19      PT SHORT TERM GOAL #4   Title  Sit and stand with RT=LT wt bearing to reduce lumbar strain and allow for increased tolerance for these positions for work tasks    Baseline  Pt stands with wt bearing in LT dominant    Time  3    Period  Weeks    Status  New    Target Date  11/28/19        PT Long Term Goals - 10/31/19 1636      PT LONG TERM GOAL #1   Title  Pt will be independent with advanced HEP    Baseline  no knowledge    Time  8    Period  Weeks    Status  New    Target Date  12/26/19      PT LONG TERM GOAL #2   Title  Pt need to be able to carry 25 #  of groceries without exacerbating pain in low back    Baseline  Pt pain in back when flaring up to 9/10 RT side sciatica    Time  8    Period  Weeks    Status  New    Target Date  12/26/19      PT LONG TERM GOAL #3   Title  Pt will be able to bend over to pick up young sons  up to 40 lb with good body mechanics and form to prevent re injury    Baseline  Pt unable at times to pick up children due to 9/10 pain and decreased strength    Time  8    Period  Weeks    Status  New    Target Date   12/26/19      PT LONG TERM GOAL #4   Title  Pt will be able to walk up and down steps carrying laundry without exacerbating pain    Baseline  unable to walk up steps and sometimes husband must carry    Time  8    Period  Weeks    Status  New    Target Date  12/26/19      PT LONG TERM GOAL #5   Title  Pt will be able to stand and wash dishes /complete chores uninterrupted    Baseline  Pt unable  to stand for greater than 2 minutes    Time  8    Period  Weeks    Status  New    Target Date  12/26/19             Plan - 10/31/19 1637    Clinical Impression Statement  Pt is a 34 year old female with c/o of increased radicular  LBP and occasional radicular pain into R hip/SI  following insidious onset for approximately 3 months . 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  Pt has some hypermobility and lumbar scoliosis/lordosis.  Pt would benefit from skilled PT  to address  impariments and functional limitations and return to pain-free PLOF.    Personal Factors and Comorbidities  Comorbidity 1    Comorbidities  carpal tunnel, IBS, obesity, chronic low back pain for 2 years.  pes planus    Examination-Activity Limitations  Stairs;Carry;Bend;Stand    Examination-Participation Restrictions  Meal Prep;Laundry    Stability/Clinical Decision Making  Evolving/Moderate complexity    Clinical Decision Making  Moderate    Rehab Potential  Good    PT Frequency  2x / week   3 visits allotted MCD in 4 weeks   PT Duration  8 weeks   1 x week for initial 4 week MCD authorization   PT Treatment/Interventions  ADLs/Self Care Home Management;Electrical Stimulation;Cryotherapy;Iontophoresis 4mg /ml Dexamethasone;Moist Heat;Traction;Ultrasound;Therapeutic exercise;Therapeutic activities;Functional mobility training;Neuromuscular re-education;Patient/family  education;Passive range of motion;Manual techniques;Dry needling;Taping;Joint Manipulations    PT Next Visit Plan  Review initial HEP  possible TPDN   Check RT SI    PT Home Exercise Plan  initial HEP pre pilates and piriformis stretch and childs pose    Consulted and Agree with Plan of Care  Patient       Patient will benefit from skilled therapeutic intervention in order to improve the following deficits and impairments:  Decreased range of motion, Decreased strength, Hypermobility, Increased muscle spasms, Improper body mechanics, Postural dysfunction, Pain, Obesity  Visit Diagnosis: 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 Carpal tunnel syndrome, bilateral 10/12/2019  . Chronic low back pain with right-sided sciatica 10/12/2019  . Pes planus 10/12/2019  . Vitamin D deficiency 10/12/2019  . Healthcare maintenance 10/12/2019  .  Screening for hyperlipidemia 10/12/2019  . Diabetes mellitus screening 10/12/2019  . Heart palpitations 10/25/2018  . IBS (irritable bowel syndrome) 02/11/2014  . Iron deficiency anemia 01/05/2014  . History of hepatitis B 12/11/2013  . Obesity (BMI 35.0-39.9 without comorbidity) 12/11/2013  . Hyperprolactinemia (HCC) 06/01/2013    Garen Lah, PT Certified Exercise Expert for the Aging Adult  10/31/19 5:12 PM Phone: [phone] Fax: [phone]  [facility] Outpatient Rehabilitation [facility] [address] [place], [place], [address] Phone: [phone]   Fax:  [phone]  Name: Morgan [name] MRN: [id number] Date of Birth: 03/01/1986

## 2019-10-31 NOTE — Patient Instructions (Signed)
   PELVIC TILT  Lie on back, legs bent. Exhale, tilting top of pelvis back, pubic bone up, to flatten lower back. Inhale, rolling pelvis opposite way, top forward, pubic bone down, arch in back. Repeat __10__ times. Do __2__ sessions per day. Copyright  VHI. All rights reserved.    Isometric Hold With Pelvic Floor (Hook-Lying)  Lie with hips and knees bent. Slowly inhale, and then exhale. Pull navel toward spine and tighten pelvic floor. Hold for __10_ seconds. Continue to breathe in and out during hold. Rest for _10__ seconds. Repeat __10_ times. Do __2-3_ times a day.   Knee Fold  Lie on back, legs bent, arms by sides. Exhale, lifting knee to chest. Inhale, returning. Keep abdominals flat, navel to spine. Repeat __10__ times, alternating legs. Do __2__ sessions per day.  Knee Drop  Keep pelvis stable. Without rotating hips, slowly drop knee to side, pause, return to center, bring knee across midline toward opposite hip. Feel obliques engaging. Repeat for ___10_ times each leg.       Copyright  VHI. All rights reserved.  Piriformis (Supine)   Cross legs, right on top. Gently pull other knee toward chest until stretch is felt in buttock/hip of top leg. Hold _30___ seconds. Repeat ___3_ times per set.  Do __2__ sessions per day. For you Darrelle  Right side only  http://orth.exer.us/676   Copyright  VHI. All rights reserved.  Lower Trunk Rotation Stretch      Copyright  VHI. All rights reserved.  Side Twist, Kneeling   Kneel, buttocks on heels. Fold upper body forward from hips. Then reach to each side as far as possible, keeping chest low toward floor. Hold each position _30__ seconds. Repeat __3_ times per session. Do __2_ sessions per day.  Copyright  VHI. All rights reserved.    Garen Lah, PT Certified Exercise Expert for the Aging Adult  10/31/19 4:33 PM Phone: [phone] Fax: [phone]

## 2019-11-06 ENCOUNTER — Other Ambulatory Visit: Payer: Self-pay

## 2019-11-06 ENCOUNTER — Ambulatory Visit (INDEPENDENT_AMBULATORY_CARE_PROVIDER_SITE_OTHER): Payer: Medicaid Other

## 2019-11-06 ENCOUNTER — Encounter: Payer: Self-pay | Admitting: Podiatry

## 2019-11-06 ENCOUNTER — Ambulatory Visit: Payer: Medicaid Other | Admitting: Podiatry

## 2019-11-06 DIAGNOSIS — M2140 Flat foot [pes planus] (acquired), unspecified foot: Secondary | ICD-10-CM

## 2019-11-06 DIAGNOSIS — M2142 Flat foot [pes planus] (acquired), left foot: Secondary | ICD-10-CM | POA: Diagnosis not present

## 2019-11-06 DIAGNOSIS — M659 Synovitis and tenosynovitis, unspecified: Secondary | ICD-10-CM

## 2019-11-06 DIAGNOSIS — M2141 Flat foot [pes planus] (acquired), right foot: Secondary | ICD-10-CM

## 2019-11-06 MED ORDER — MELOXICAM 15 MG PO TABS: 15.0000 mg | ORAL_TABLET | Freq: Every day | ORAL | 1 refills | Status: DC

## 2019-11-06 NOTE — Patient Instructions (Addendum)
[facility] [place] - 10% off Medical Referral

## 2019-11-08 NOTE — Progress Notes (Signed)
   Subjective:  34 y.o. female presenting today as a new patient with a chief complaint of intermittent aching pain of the bilateral feet, right worse than left, that has been ongoing for the past few years. Standing, walking and climbing stairs makes the pain worse. She has been taking Naproxen for treatment. Patient is here for further evaluation and treatment.   Past Medical History:  Diagnosis Date  . Complication of anesthesia    Patient states when given the epidural the numbness went up to her nose.   . Diabetes mellitus without complication (HCC)   . Hepatitis   . Hepatitis B carrier (HCC)        Objective/Physical Exam General: The patient is alert and oriented x3 in no acute distress.  Dermatology: Skin is warm, dry and supple bilateral lower extremities. Negative for open lesions or macerations.  Vascular: Palpable pedal pulses bilaterally. No edema or erythema noted. Capillary refill within normal limits.  Neurological: Epicritic and protective threshold grossly intact bilaterally.   Musculoskeletal Exam: Range of motion within normal limits to all pedal and ankle joints bilateral. Muscle strength 5/5 in all groups bilateral.  Upon weightbearing there is a medial longitudinal arch collapse bilaterally. Remove foot valgus noted to the bilateral lower extremities with excessive pronation upon mid stance. Pain with palpation noted to the anterior, lateral and medial aspects of the right ankle joint.   Radiographic Exam:  Normal osseous mineralization. Joint spaces preserved. No fracture/dislocation/boney destruction.   Pes planus noted on radiographic exam lateral views. Decreased calcaneal inclination and metatarsal declination angle is noted. Anterior break in the cyma line noted on lateral views. Medial talar head to deviation noted on AP radiograph.   Assessment: 1. pes planus bilateral 2. Synovitis right ankle    Plan of Care:  1. Patient was evaluated. X-Rays  reviewed.  2. Declined injection.  3. Prescription for Meloxicam provided to patient. 4. Recommended shoes from [employer].  5. Return to clinic in 2 months.   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    Felecia Shelling, DPM [facility]  Dr. Felecia Shelling, DPM    [address]                                        [place], [place] [address]                Office [phone]  Fax [phone]

## 2019-11-14 ENCOUNTER — Ambulatory Visit: Payer: Medicaid Other | Admitting: Physical Therapy

## 2019-11-14 ENCOUNTER — Other Ambulatory Visit: Payer: Self-pay

## 2019-11-14 ENCOUNTER — Encounter: Payer: Self-pay | Admitting: Physical Therapy

## 2019-11-14 DIAGNOSIS — G8929 Other chronic pain: Secondary | ICD-10-CM

## 2019-11-14 DIAGNOSIS — M6281 Muscle weakness (generalized): Secondary | ICD-10-CM

## 2019-11-14 DIAGNOSIS — M5441 Lumbago with sciatica, right side: Secondary | ICD-10-CM | POA: Diagnosis not present

## 2019-11-14 DIAGNOSIS — R252 Cramp and spasm: Secondary | ICD-10-CM

## 2019-11-14 DIAGNOSIS — R293 Abnormal posture: Secondary | ICD-10-CM

## 2019-11-14 NOTE — Patient Instructions (Signed)
   Trigger Point Dry Needling  . What is Trigger Point Dry Needling (DN)? o DN is a physical therapy technique used to treat muscle pain and dysfunction. Specifically, DN helps deactivate muscle trigger points (muscle knots).  o A thin filiform needle is used to penetrate the skin and stimulate the underlying trigger point. The goal is for a local twitch response (LTR) to occur and for the trigger point to relax. No medication of any kind is injected during the procedure.   . What Does Trigger Point Dry Needling Feel Like?  o The procedure feels different for each individual patient. Some patients report that they do not actually feel the needle enter the skin and overall the process is not painful. Very mild bleeding may occur. However, many patients feel a deep cramping in the muscle in which the needle was inserted. This is the local twitch response.   Marland Kitchen How Will I feel after the treatment? o Soreness is normal, and the onset of soreness may not occur for a few hours. Typically this soreness does not last longer than two days.  o Bruising is uncommon, however; ice can be used to decrease any possible bruising.  o In rare cases feeling tired or nauseous after the treatment is normal. In addition, your symptoms may get worse before they get better, this period will typically not last longer than 24 hours.   . What Can I do After My Treatment? o Increase your hydration by drinking more water for the next 24 hours. o You may place ice or heat on the areas treated that have become sore, however, do not use heat on inflamed or bruised areas. Heat often brings more relief post needling. o You can continue your regular activities, but vigorous activity is not recommended initially after the treatment for 24 hours. o DN is best combined with other physical therapy such as strengthening, stretching, and other therapies.   Garen Lah, PT Certified Exercise Expert for the Aging Adult  11/14/19  4:08 PM Phone: [phone] Fax: [phone]

## 2019-11-14 NOTE — Therapy (Signed)
[facility] [address] [place], [place], [address] Phone: [phone]   Fax:  [phone]  Physical Therapy Treatment  Patient Details  Name: Morgan [name] MRN: [id number] Date of Birth: 01/23/1986 Referring Provider (PT): Terisa Starr MD   Encounter Date: 11/14/2019  PT End of Session - 11/14/19 1557    Visit Number  2    Number of Visits  4    Date for PT Re-Evaluation  11/28/19    Authorization Type  MCD   1st authorization sent 10-31-19    Authorization - Visit Number  2    Authorization - Number of Visits  4    PT Start Time  1550    PT Stop Time  1647    PT Time Calculation (min)  57 min    Activity Tolerance  Patient tolerated treatment well    Behavior During Therapy  [facility] for tasks assessed/performed       Past Medical History:  Diagnosis Date  . Complication of anesthesia    Patient states when given the epidural the numbness went up to her nose.   . Diabetes mellitus without complication (HCC)   . Hepatitis   . Hepatitis B carrier [facility])     Past Surgical History:  Procedure Laterality Date  . CARPAL TUNNEL RELEASE      There were no vitals filed for this visit.  Subjective Assessment - 11/14/19 1554    Subjective  I carried something heavy and I had pain in mid back ( big box of toys 30 pounds)    Pertinent History  carpal tunnel, IBS, obesity, chronic low back pain for 2 years.  pes planus    Currently in Pain?  Yes    Pain Score  2     Pain Location  Back    Pain Orientation  Right;Left;Mid                        [facility] Adult PT Treatment/Exercise - 11/14/19 0001      Lumbar Exercises: Stretches   Single Knee to Chest Stretch  Right;Left;5 reps;10 seconds    Lower Trunk Rotation  3 reps;20 seconds    Lower Trunk Rotation Limitations  RT and then LT    Quadruped Mid Back Stretch  5 reps;30 seconds    Quadruped Mid Back Stretch Limitations  forward, side to side     Piriformis Stretch  Right;3 reps;30 seconds    Piriformis Stretch Limitations  Rt ankle on LT knee and knee to chest      Lumbar Exercises: Aerobic   Recumbent Bike  5 min       Lumbar Exercises: Supine   Bridge  10 reps;3 seconds      Knee/Hip Exercises: Standing   Other Standing Knee Exercises  squat with 15 # KB 3 x 10 with 1 min rest between sets      Modalities   Modalities  Moist Heat      Moist Heat Therapy   Number Minutes Moist Heat  15 Minutes    Moist Heat Location  Lumbar Spine   lower thoracic     Manual Therapy   Manual Therapy  Soft tissue mobilization    Manual therapy comments  skilled palpation for TPDN    Soft tissue mobilization  bil QL and thoraci and Lumbar paraspinals   Pt felt immediate decrease in pain with TPDN      Trigger Point Dry  Needling - 11/14/19 0001    Consent Given?  Yes    Education Handout Provided  Yes    Muscles Treated Back/Hip  Erector spinae    Dry Needling Comments  .40 mmgage 100 mm    Erector spinae Response  Twitch response elicited;Palpable increased muscle length   T-10 to L2 bil   Quadratus Lumborum Response  Twitch response elicited;Palpable increased muscle length   bil          PT Education - 11/14/19 1609    Education Details  educaton on TPDN,  progressed HEP for back    Person(s) Educated  Patient    Methods  Explanation;Demonstration;Tactile cues;Verbal cues;Handout    Comprehension  Verbalized understanding;Returned demonstration       PT Short Term Goals - 10/31/19 1633      PT SHORT TERM GOAL #1   Title  Pt will be independent with iniital HEP and abdominal engagement    Baseline  no knowledge    Time  3    Period  Weeks    Status  New    Target Date  11/28/19      PT SHORT TERM GOAL #2   Title  Pt will  be educated on proper body mechanics and lifting in order to  care for 3 boys at home and household chores    Baseline  Pt with no knowledged of good body mechanics and exhibits compensations     Time  3    Period  Weeks    Status  New    Target Date  11/28/19      PT SHORT TERM GOAL #3   Title  Pt will improved Hip MMT to at least 4-/5 in order to perform basic squats with good form and no heel lifting or compensations    Baseline  Pt with Rt hip ext/hip abd 3-/5    Time  3    Period  Weeks    Status  New    Target Date  11/28/19      PT SHORT TERM GOAL #4   Title  Sit and stand with RT=LT wt bearing to reduce lumbar strain and allow for increased tolerance for these positions for work tasks    Baseline  Pt stands with wt bearing in LT dominant    Time  3    Period  Weeks    Status  New    Target Date  11/28/19        PT Long Term Goals - 10/31/19 1636      PT LONG TERM GOAL #1   Title  Pt will be independent with advanced HEP    Baseline  no knowledge    Time  8    Period  Weeks    Status  New    Target Date  12/26/19      PT LONG TERM GOAL #2   Title  Pt need to be able to carry 25 #  of groceries without exacerbating pain in low back    Baseline  Pt pain in back when flaring up to 9/10 RT side sciatica    Time  8    Period  Weeks    Status  New    Target Date  12/26/19      PT LONG TERM GOAL #3   Title  Pt will be able to bend over to pick up young sons up to 40 lb with good body mechanics and form to  prevent re injury    Baseline  Pt unable at times to pick up children due to 9/10 pain and decreased strength    Time  8    Period  Weeks    Status  New    Target Date  12/26/19      PT LONG TERM GOAL #4   Title  Pt will be able to walk up and down steps carrying laundry without exacerbating pain    Baseline  unable to walk up steps and sometimes husband must carry    Time  8    Period  Weeks    Status  New    Target Date  12/26/19      PT LONG TERM GOAL #5   Title  Pt will be able to stand and wash dishes /complete chores uninterrupted    Baseline  Pt unable  to stand for greater than 2 minutes    Time  8    Period  Weeks    Status  New     Target Date  12/26/19        Access Code: [email or web address]: [email or web address]: 05/25/2021Prepared by: Wayland Denis BeardsleyExercises  Supine Pelvic Tilt - 1 x daily - 7 x weekly - 10 reps - 3 sets  Supine Single Knee to Chest Stretch - 1 x daily - 7 x weekly - 1 sets - 5 reps - 10 hold  Supine Lower Trunk Rotation - 1 x daily - 7 x weekly - 1 sets - 5 reps - 20 hold  Supine Bridge - 1 x daily - 7 x weekly - 10 reps - 3 sets  Goblet Squat with Kettlebell - 1 x daily - 7 x weekly - 10 reps - 3 sets      Plan - 11/14/19 1606    Clinical Impression Statement  Pt now 2/10 from stretching last week.  she consents to [facility] for specific pain over bil QL and lower thoracic paraspinal.  Pt recieved immediate decrease in spasm after TPDN and was closely monitored throughout session.  Pt complained of back spasming with lifting about 30 pounds and weakness with lifting noted.  Given squats with 15 # to train for strength.  will cont POC.    Personal Factors and Comorbidities  Comorbidity 1    Comorbidities  carpal tunnel, IBS, obesity, chronic low back pain for 2 years.  pes planus    Examination-Activity Limitations  Stairs;Carry;Bend;Stand    Examination-Participation Restrictions  Meal Prep;Laundry    Stability/Clinical Decision Making  Evolving/Moderate complexity    Clinical Decision Making  Moderate    Rehab Potential  Good    PT Frequency  2x / week    PT Duration  8 weeks    PT Treatment/Interventions  ADLs/Self Care Home Management;Electrical Stimulation;Cryotherapy;Iontophoresis 4mg /ml Dexamethasone;Moist Heat;Traction;Ultrasound;Therapeutic exercise;Therapeutic activities;Functional mobility training;Neuromuscular re-education;Patient/family education;Passive range of motion;Manual techniques;Dry needling;Taping;Joint Manipulations    PT Next Visit Plan  Show lifting strengtheneing wit Deadlift and hip hinge    PT Home Exercise Plan  initial HEP pre pilates and piriformis  stretch and childs [id number]    Consulted and Agree with Plan of Care  Patient       Patient will benefit from skilled therapeutic intervention in order to improve the following deficits and impairments:  Decreased range of motion, Decreased strength, Hypermobility, Increased muscle spasms, Improper body mechanics, Postural dysfunction, Pain, Obesity  Visit Diagnosis: 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 Carpal tunnel syndrome, bilateral 10/12/2019  . Chronic low back pain with right-sided sciatica 10/12/2019  . Pes planus 10/12/2019  . Vitamin D deficiency 10/12/2019  . Healthcare maintenance 10/12/2019  . Screening for hyperlipidemia 10/12/2019  . Diabetes mellitus screening 10/12/2019  . Heart palpitations 10/25/2018  . IBS (irritable bowel syndrome) 02/11/2014  . Iron deficiency anemia 01/05/2014  . History of hepatitis B 12/11/2013  . Obesity (BMI 35.0-39.9 without comorbidity) 12/11/2013  . Hyperprolactinemia ([place]) 06/01/2013    Voncille Lo, PT Certified Exercise Expert for the Aging Adult  11/14/19 4:37 PM Phone: [phone] Fax: [facility] [address] [place], [place], [address] Phone: [phone]   Fax:  [phone]  Name: Sydney Hasten MRN: [id number] Date of Birth: [birth year]

## 2019-11-22 ENCOUNTER — Ambulatory Visit: Payer: Medicaid Other | Attending: Family Medicine | Admitting: Physical Therapy

## 2019-11-22 ENCOUNTER — Encounter: Payer: Self-pay | Admitting: Physical Therapy

## 2019-11-22 ENCOUNTER — Other Ambulatory Visit: Payer: Self-pay

## 2019-11-22 DIAGNOSIS — R252 Cramp and spasm: Secondary | ICD-10-CM | POA: Diagnosis present

## 2019-11-22 DIAGNOSIS — R293 Abnormal posture: Secondary | ICD-10-CM | POA: Diagnosis present

## 2019-11-22 DIAGNOSIS — G8929 Other chronic pain: Secondary | ICD-10-CM | POA: Diagnosis present

## 2019-11-22 DIAGNOSIS — M5441 Lumbago with sciatica, right side: Secondary | ICD-10-CM | POA: Diagnosis present

## 2019-11-22 DIAGNOSIS — M6281 Muscle weakness (generalized): Secondary | ICD-10-CM | POA: Diagnosis present

## 2019-11-22 NOTE — Therapy (Signed)
[place], [place], [address] Phone: [phone]   Fax:  [phone]  Physical Therapy Treatment  Patient Details  Name: Tranice Laduke MRN: [id number] Date of Birth: 09/28/1985 Referring Provider (PT): Dorris Singh MD   Encounter Date: 11/22/2019  PT End of Session - 11/22/19 1515    Visit Number  3    Number of Visits  4    Date for PT Re-Evaluation  11/28/19    Authorization Type  MCD   1st authorization sent 10-31-19    Authorization - Visit Number  3    Authorization - Number of Visits  4    PT Start Time  1696   pt arrived late   PT Stop Time  1542    PT Time Calculation (min)  27 min    Activity Tolerance  Patient tolerated treatment well    Behavior During Therapy  [facility] for tasks assessed/performed       Past Medical History:  Diagnosis Date  . Complication of anesthesia    Patient states when given the epidural the numbness went up to her nose.   . Diabetes mellitus without complication ([place])   . Hepatitis   . Hepatitis B carrier [facility])     Past Surgical History:  Procedure Laterality Date  . CARPAL TUNNEL RELEASE      There were no vitals filed for this visit.  Subjective Assessment - 11/22/19 1516    Subjective  Feeling better today, denies pain. Dry needling was very helpful.    Currently in Pain?  Yes    Pain Score  2     Pain Location  Back    Pain Orientation  Mid    Pain Descriptors / Indicators  Sore                        [facility] Adult PT Treatment/Exercise - 11/22/19 0001      Exercises   Exercises  Lumbar      Lumbar Exercises: Stretches   Passive Hamstring Stretch Limitations  seated EOB    Quadruped Mid Back Stretch  2 reps   hold 3 breaths   Quadruped Mid Back Stretch Limitations  center, Rt, Lt    Piriformis Stretch Limitations  seated figure 4      Lumbar Exercises: Standing   Lifting Limitations  10lb floor to waist; floor to counter     Other Standing Lumbar Exercises  hip hinge unweighted progressed to 10lb      Lumbar Exercises: Seated   Sit to Stand  10 reps    Other Seated Lumbar Exercises  seated hip hinge    Other Seated Lumbar Exercises  posterior pelvic tilt      Manual Therapy   Soft tissue mobilization  bil QL, thoraco lumbar paraspinals               PT Short Term Goals - 10/31/19 1633      PT SHORT TERM GOAL #1   Title  Pt will be independent with iniital HEP and abdominal engagement    Baseline  no knowledge    Time  3    Period  Weeks    Status  New    Target Date  11/28/19      PT SHORT TERM GOAL #2   Title  Pt will  be educated on proper body mechanics and lifting in order to  care for 3 boys  at home and household chores    Baseline  Pt with no knowledged of good body mechanics and exhibits compensations    Time  3    Period  Weeks    Status  New    Target Date  11/28/19      PT SHORT TERM GOAL #3   Title  Pt will improved Hip MMT to at least 4-/5 in order to perform basic squats with good form and no heel lifting or compensations    Baseline  Pt with Rt hip ext/hip abd 3-/5    Time  3    Period  Weeks    Status  New    Target Date  11/28/19      PT SHORT TERM GOAL #4   Title  Sit and stand with RT=LT wt bearing to reduce lumbar strain and allow for increased tolerance for these positions for work tasks    Baseline  Pt stands with wt bearing in LT dominant    Time  3    Period  Weeks    Status  New    Target Date  11/28/19        PT Long Term Goals - 10/31/19 1636      PT LONG TERM GOAL #1   Title  Pt will be independent with advanced HEP    Baseline  no knowledge    Time  8    Period  Weeks    Status  New    Target Date  12/26/19      PT LONG TERM GOAL #2   Title  Pt need to be able to carry 25 #  of groceries without exacerbating pain in low back    Baseline  Pt pain in back when flaring up to 9/10 RT side sciatica    Time  8    Period  Weeks    Status  New     Target Date  12/26/19      PT LONG TERM GOAL #3   Title  Pt will be able to bend over to pick up young sons up to 40 lb with good body mechanics and form to prevent re injury    Baseline  Pt unable at times to pick up children due to 9/10 pain and decreased strength    Time  8    Period  Weeks    Status  New    Target Date  12/26/19      PT LONG TERM GOAL #4   Title  Pt will be able to walk up and down steps carrying laundry without exacerbating pain    Baseline  unable to walk up steps and sometimes husband must carry    Time  8    Period  Weeks    Status  New    Target Date  12/26/19      PT LONG TERM GOAL #5   Title  Pt will be able to stand and wash dishes /complete chores uninterrupted    Baseline  Pt unable  to stand for greater than 2 minutes    Time  8    Period  Weeks    Status  New    Target Date  12/26/19            Plan - 11/22/19 1547    Clinical Impression Statement  Shortened session today focused on functional bending, reaching and lifting to reduce use of lumbar extensors. Pt asked that we decrease  weight from 15lb to 10 due to soreness from last session. Overall she felt much better after the dry needling and was able to perform all activities without incr LBP.    PT Treatment/Interventions  ADLs/Self Care Home Management;Electrical Stimulation;Cryotherapy;Iontophoresis 4mg /ml Dexamethasone;Moist Heat;Traction;Ultrasound;Therapeutic exercise;Therapeutic activities;Functional mobility training;Neuromuscular re-education;Patient/family education;Passive range of motion;Manual techniques;Dry needling;Taping;Joint Manipulations    PT Next Visit Plan  MCD ERO    PT Home Exercise Plan  initial HEP pre pilates and piriformis stretch and childs [id number]    Consulted and Agree with Plan of Care  Patient       Patient will benefit from skilled therapeutic intervention in order to improve the following deficits and impairments:  Decreased range of motion,  Decreased strength, Hypermobility, Increased muscle spasms, Improper body mechanics, Postural dysfunction, Pain, Obesity  Visit Diagnosis: 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 Carpal tunnel syndrome, bilateral 10/12/2019  . Chronic low back pain with right-sided sciatica 10/12/2019  . Pes planus 10/12/2019  . Vitamin D deficiency 10/12/2019  . Healthcare maintenance 10/12/2019  . Screening for hyperlipidemia 10/12/2019  . Diabetes mellitus screening 10/12/2019  . Heart palpitations 10/25/2018  . IBS (irritable bowel syndrome) 02/11/2014  . Iron deficiency anemia 01/05/2014  . History of hepatitis B 12/11/2013  . Obesity (BMI 35.0-39.9 without comorbidity) 12/11/2013  . Hyperprolactinemia (HCC) 06/01/2013    Teddrick Mallari C. Livier Hendel PT, DPT 11/22/19 3:49 PM   [facility] [address] [place], [place], [place] Phone: [phone]   Fax:  [phone]  Name: [place] Lesure MRN: Freddie Breech Date of Birth: 06/28/1985

## 2019-11-28 ENCOUNTER — Ambulatory Visit: Payer: Medicaid Other | Admitting: Physical Therapy

## 2019-11-28 ENCOUNTER — Other Ambulatory Visit: Payer: Self-pay

## 2019-11-28 DIAGNOSIS — R252 Cramp and spasm: Secondary | ICD-10-CM

## 2019-11-28 DIAGNOSIS — R293 Abnormal posture: Secondary | ICD-10-CM

## 2019-11-28 DIAGNOSIS — M6281 Muscle weakness (generalized): Secondary | ICD-10-CM

## 2019-11-28 DIAGNOSIS — G8929 Other chronic pain: Secondary | ICD-10-CM

## 2019-11-28 DIAGNOSIS — M5441 Lumbago with sciatica, right side: Secondary | ICD-10-CM | POA: Diagnosis not present

## 2019-11-28 NOTE — Patient Instructions (Signed)

## 2019-11-28 NOTE — Therapy (Signed)
[place], [place], [address] Phone: [phone]   Fax:  [phone]  Physical Therapy Treatment/Discharge Note  Patient Details  Name: Morgan [name] MRN: [id number] Date of Birth: 1986-01-04 Referring Provider (PT): Dorris Singh MD   Encounter Date: 11/28/2019  PT End of Session - 11/28/19 1506    Visit Number  4    Number of Visits  4    Date for PT Re-Evaluation  11/28/19    Authorization Type  MCD   1st authorization sent 10-31-19 2nd suthorication submitted 11-28-19    Authorization - Visit Number  4    Authorization - Number of Visits  4       Past Medical History:  Diagnosis Date  . Complication of anesthesia    Patient states when given the epidural the numbness went up to her nose.   . Diabetes mellitus without complication ([place])   . Hepatitis   . Hepatitis B carrier [facility])     Past Surgical History:  Procedure Laterality Date  . CARPAL TUNNEL RELEASE      There were no vitals filed for this visit.  Subjective Assessment - 11/28/19 1507    Subjective  I really have no pain just some tightnesss higher in my back.  I feel like I am doing well.  I actually have more problems with my urinary incontinence  ( stress when laughing etc)    Pertinent History  carpal tunnel, IBS, obesity, chronic low back pain for 2 years.  pes planus    How long can you sit comfortably?  Relief when i sitting    Currently in Pain?  No/denies    Pain Score  0-No pain    Pain Location  Back    Pain Orientation  Mid    Pain Descriptors / Indicators  Sore    Pain Type  Chronic pain    Pain Radiating Towards  only residual tightness over lower thoracic    Pain Onset  More than a month ago    Pain Frequency  Intermittent         [facility] PT Assessment - 11/28/19 0001      Assessment   Medical Diagnosis  chronic low back pain    Referring Provider (PT)  Dorris Singh MD    Onset Date/Surgical Date  08/03/19       Observation/Other Assessments   Focus on Therapeutic Outcomes (FOTO)   MCD NA      Functional Tests   Functional tests  Squat;Lunges;Sit to Stand      Squat   Comments  able to squat 90 degrees flexion symmetrically      Lunges   Comments  able to lunge without added weight      Sit to Stand   Comments  5x STS 9.25 sec      AROM   Overall AROM   Deficits    Overall AROM Comments   Hip IR/ER hypermobility     Lumbar Flexion  100%   able to touch floor   Lumbar Extension  80 %    Lumbar - Right Side Bend  80%    Lumbar - Left Side Bend  80%    Lumbar - Right Rotation  WFL    Lumbar - Left Rotation  [facility]      Strength   Overall Strength  Deficits    Right Hip Flexion  5/5    Right Hip Extension  4/5  Right Hip ABduction  4/5    Left Hip Flexion  4+/5    Left Hip Extension  4/5    Left Hip ABduction  4/5    Right Knee Flexion  4+/5    Right Knee Extension  4+/5    Left Knee Flexion  4+/5    Left Knee Extension  4+/5    Right Ankle Dorsiflexion  4+/5    Left Ankle Dorsiflexion  5/5      Palpation   Palpation comment  no tenderness over lumbar areas  , slight tenderness over lower thoracic but resolved with TPDN                     [facility] Adult PT Treatment/Exercise - 11/28/19 0001      Self-Care   Self-Care  ADL's;Lifting;Posture    ADL's  reviewed with handout    Lifting  worked on proper lifting using 30# KB and VC and TC    Posture  reviewed posture for sitting and standing and sleep postures       Exercises   Exercises  Lumbar      Lumbar Exercises: Stretches   Passive Hamstring Stretch Limitations  seated EOB    Single Knee to Chest Stretch  Right;Left;5 reps;10 seconds    Lower Trunk Rotation  3 reps;20 seconds    Lower Trunk Rotation Limitations  RT and then LT    Quadruped Mid Back Stretch  4 reps    Quadruped Mid Back Stretch Limitations  center, Rt, Lt    Piriformis Stretch Limitations  seated figure 4      Lumbar Exercises:  Standing   Lifting Limitations  30 lb KB 5 x floor to waist    Other Standing Lumbar Exercises  hip hinge unweighted progressed to 10lb      Lumbar Exercises: Seated   Other Seated Lumbar Exercises  seated hip hinge    Other Seated Lumbar Exercises  posterior pelvic tilt      Modalities   Modalities  Moist Heat      Moist Heat Therapy   Moist Heat Location  Lumbar Spine   lower thoracic     Manual Therapy   Manual Therapy  Soft tissue mobilization    Manual therapy comments  skilled palpation for TPDN    Soft tissue mobilization  bil QL, thoraco lumbar paraspinals       Trigger Point Dry Needling - 11/28/19 0001    Consent Given?  Yes    Muscles Treated Back/Hip  Erector spinae;Quadratus lumborum;Thoracic multifidi   bil   Dry Needling Comments  .40 mmgage 100 mm    Erector spinae Response  Twitch response elicited;Palpable increased muscle length    Quadratus Lumborum Response  Twitch response elicited;Palpable increased muscle length    Thoracic multifidi response  Twitch response elicited;Palpable increased muscle length           PT Education - 11/28/19 1705    Education Details  education on ADL, body mechanics, lifting and review of HEP    Person(s) Educated  Patient    Methods  Explanation;Demonstration;Tactile cues;Verbal cues;Handout    Comprehension  Verbalized understanding;Returned demonstration       PT Short Term Goals - 11/28/19 1508      PT SHORT TERM GOAL #1   Title  Pt will be independent with iniital HEP and abdominal engagement    Baseline  now independent    Time  3    Period  Weeks    Status  Achieved    Target Date  11/28/19      PT SHORT TERM GOAL #2   Title  Pt will  be educated on proper body mechanics and lifting in order to  care for 3 boys at home and household chores    Baseline  Pt educated and given handout    Time  3    Period  Weeks    Status  Achieved    Target Date  11/28/19      PT SHORT TERM GOAL #3   Title  Pt will  improved Hip MMT to at least 4-/5 in order to perform basic squats with good form and no heel lifting or compensations    Baseline  Pt hip ext at least 4/5    Time  3    Period  Weeks    Status  Achieved      PT SHORT TERM GOAL #4   Title  Sit and stand with RT=LT wt bearing to reduce lumbar strain and allow for increased tolerance for these positions for work tasks    Baseline  Pt stands with wt bearing RT/LT symmetrical    Time  3    Period  Weeks    Status  Achieved        PT Long Term Goals - 11/28/19 1519      PT LONG TERM GOAL #1   Title  Pt will be independent with advanced HEP    Baseline  Pt independent with HEP given and 0/10 pain    Time  8    Period  Weeks    Status  Achieved      PT LONG TERM GOAL #2   Title  Pt need to be able to carry 25 #  of groceries without exacerbating pain in low back    Baseline  Pt able to lift from floor to weight 30# KB    Time  8    Period  Weeks    Status  Achieved      PT LONG TERM GOAL #3   Title  Pt will be able to bend over to pick up young sons up to 40 lb with good body mechanics and form to prevent re injury    Baseline  Pt given handout for ADL body mechanicis and demo lifting but only able to carry 30 lb KB    Time  8    Period  Weeks    Status  Partially Met      PT LONG TERM GOAL #4   Title  Pt will be able to walk up and down steps carrying laundry without exacerbating pain    Baseline  Pt with no complaints of walking up steps    Time  8    Period  Weeks    Status  Achieved      PT LONG TERM GOAL #5   Title  Pt will be able to stand and wash dishes /complete chores uninterrupted    Baseline  Pt states she is able to carry out household duties    Time  8    Period  Weeks    Status  Achieved            Plan - 11/28/19 1507    Clinical Impression Statement  Pt enters clinic for 4th visit and has 0/10 pain.  Pt states she no longer needs PT for her back and would like to  be discharged.  She wants to  continue to strengthen at a local gym.  All STG are met and All LSTG pertially met or met.  Pt has been an exmplary patient but also complains of Stress urinary incontinence with laughing and coughing.  Morgan [name] was informed about pelvic floor PT and was told to consider since she is 10 months post partum and would greatly benefit if she chooses to pursue further PT in the future.    Personal Factors and Comorbidities  Comorbidity 1    Comorbidities  carpal tunnel, IBS, obesity, chronic low back pain for 2 years.  pes planus    Examination-Activity Limitations  Stairs;Carry;Bend;Stand    Examination-Participation Restrictions  Meal Prep;Laundry    Rehab Potential  Good    PT Next Visit Plan  DC as per pt requests since she no  longer has pain    PT Home Exercise Plan  initial HEP pre pilates and piriformis stretch and childs [id number]       Patient will benefit from skilled therapeutic intervention in order to improve the following deficits and impairments:  Decreased range of motion, Decreased strength, Hypermobility, Increased muscle spasms, Improper body mechanics, Postural dysfunction, Pain, Obesity  Visit Diagnosis: 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 Carpal tunnel syndrome, bilateral 10/12/2019  . Chronic low back pain with right-sided sciatica 10/12/2019  . Pes planus 10/12/2019  . Vitamin D deficiency 10/12/2019  . Healthcare maintenance 10/12/2019  . Screening for hyperlipidemia 10/12/2019  . Diabetes mellitus screening 10/12/2019  . Heart palpitations 10/25/2018  . IBS (irritable bowel syndrome) 02/11/2014  . Iron deficiency anemia 01/05/2014  . History of hepatitis B 12/11/2013  . Obesity (BMI 35.0-39.9 without comorbidity) 12/11/2013  . Hyperprolactinemia ([place]) 06/01/2013  Voncille Lo,  PT Certified Exercise Expert for the Aging Adult  11/28/19 5:05 PM Phone: [phone] Fax: [place] [facility] [address] [place], [place], [address] Phone: [phone]   Fax:  [phone]  Name: Morgan [name] MRN: [id number] Date of Birth: [birth year]   PHYSICAL THERAPY DISCHARGE SUMMARY  Visits from Start of Care: 4  Current functional level related to goals / functional outcomes: As above  Remaining deficits: None  Can benefit from continuing strengthening at local gym   Education / Equipment: HEP Plan: Patient agrees to discharge.  Patient goals were partially met. Patient is being discharged due to the patient's request.  ?????    Pt has no pain and will continue strengthening at gym at pt request.   If patient needs further PT please write new RX. Pt will benefit from further PT for womens's pelvic health ( pelvic floor PT) if her problem with stress incontinence does not resolve.Will now discharge with STG achieved and pt request to continue strengthening at gym.  Voncille Lo, PT Certified Exercise Expert for the Aging Adult  11/28/19 5:05 PM Phone: [phone] Fax: [phone]

## 2019-12-11 ENCOUNTER — Ambulatory Visit (INDEPENDENT_AMBULATORY_CARE_PROVIDER_SITE_OTHER): Payer: Medicaid Other | Admitting: Podiatry

## 2019-12-11 ENCOUNTER — Other Ambulatory Visit: Payer: Self-pay

## 2019-12-11 DIAGNOSIS — M2141 Flat foot [pes planus] (acquired), right foot: Secondary | ICD-10-CM | POA: Diagnosis not present

## 2019-12-11 DIAGNOSIS — M2140 Flat foot [pes planus] (acquired), unspecified foot: Secondary | ICD-10-CM

## 2019-12-11 DIAGNOSIS — M2142 Flat foot [pes planus] (acquired), left foot: Secondary | ICD-10-CM | POA: Diagnosis not present

## 2019-12-11 DIAGNOSIS — M659 Synovitis and tenosynovitis, unspecified: Secondary | ICD-10-CM

## 2019-12-11 NOTE — Progress Notes (Signed)
   Subjective:  34 y.o. female presenting today for follow-up evaluation regarding generalized foot pain to the bilateral feet secondary to pes planus.  Patient states that she has been wearing shoes around the house which is helped significantly.  She has also taken the meloxicam as needed which does help alleviate some of her symptoms.  Overall the patient states that she does have some improvement with no new complaints at this time.  She presents today for further treatment and evaluation   Past Medical History:  Diagnosis Date  . Complication of anesthesia    Patient states when given the epidural the numbness went up to her nose.   . Diabetes mellitus without complication ([facility])   . Hepatitis   . Hepatitis B carrier ([facility])        Objective/Physical Exam General: The patient is alert and oriented x3 in no acute distress.  Dermatology: Skin is warm, dry and supple bilateral lower extremities. Negative for open lesions or macerations.  Vascular: Palpable pedal pulses bilaterally. No edema or erythema noted. Capillary refill within normal limits.  Neurological: Epicritic and protective threshold grossly intact bilaterally.   Musculoskeletal Exam: Range of motion within normal limits to all pedal and ankle joints bilateral. Muscle strength 5/5 in all groups bilateral.  Upon weightbearing there is a medial longitudinal arch collapse bilaterally. Remove foot valgus noted to the bilateral lower extremities with excessive pronation upon mid stance. Pain with palpation noted to the anterior, lateral and medial aspects of the right ankle joint.  Assessment: 1. pes planus bilateral 2. Synovitis right ankle    Plan of Care:  1. Patient was evaluated. 2.  Recommend good supportive shoes from [employer] 3.  Continue meloxicam as needed 4.  Continue wearing good supportive shoes even throughout the house and not going barefoot 5.  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     Felecia Shelling, DPM [facility]  Dr. Felecia Shelling, DPM    [address]                                        [place], [place] [address]                Office [phone]  Fax [phone]

## 2019-12-22 ENCOUNTER — Ambulatory Visit: Payer: Medicaid Other | Attending: Internal Medicine

## 2019-12-22 ENCOUNTER — Other Ambulatory Visit: Payer: Self-pay | Admitting: Internal Medicine

## 2019-12-22 DIAGNOSIS — Z20822 Contact with and (suspected) exposure to covid-19: Secondary | ICD-10-CM

## 2019-12-23 LAB — SARS-COV-2, NAA 2 DAY TAT

## 2019-12-23 LAB — NOVEL CORONAVIRUS, NAA: SARS-CoV-2, NAA: NOT DETECTED

## 2020-06-22 HISTORY — PX: SLEEVE GASTROPLASTY: SHX1101

## 2020-06-28 ENCOUNTER — Other Ambulatory Visit: Payer: Self-pay

## 2020-11-17 DIAGNOSIS — Z20822 Contact with and (suspected) exposure to covid-19: Secondary | ICD-10-CM | POA: Diagnosis not present

## 2021-01-28 HISTORY — PX: OTHER SURGICAL HISTORY: SHX169

## 2021-04-03 ENCOUNTER — Ambulatory Visit: Payer: Medicaid Other | Admitting: Family Medicine

## 2021-04-25 ENCOUNTER — Ambulatory Visit (INDEPENDENT_AMBULATORY_CARE_PROVIDER_SITE_OTHER): Payer: Medicaid Other

## 2021-04-25 DIAGNOSIS — Z23 Encounter for immunization: Secondary | ICD-10-CM

## 2021-05-01 ENCOUNTER — Ambulatory Visit: Payer: Medicaid Other | Admitting: Family Medicine

## 2021-05-05 ENCOUNTER — Ambulatory Visit (INDEPENDENT_AMBULATORY_CARE_PROVIDER_SITE_OTHER): Payer: Medicaid Other | Admitting: Family Medicine

## 2021-05-05 ENCOUNTER — Other Ambulatory Visit: Payer: Self-pay

## 2021-05-05 ENCOUNTER — Ambulatory Visit
Admission: RE | Admit: 2021-05-05 | Discharge: 2021-05-05 | Disposition: A | Discharge status: Home / Self Care | Payer: Medicaid Other | Source: Ambulatory Visit | Attending: Family Medicine | Admitting: Family Medicine

## 2021-05-05 VITALS — BP 124/88 | HR 52 | Ht 65.0 in | Wt 201.2 lb

## 2021-05-05 DIAGNOSIS — Z23 Encounter for immunization: Secondary | ICD-10-CM

## 2021-05-05 DIAGNOSIS — M79671 Pain in right foot: Secondary | ICD-10-CM

## 2021-05-05 DIAGNOSIS — E669 Obesity, unspecified: Secondary | ICD-10-CM

## 2021-05-05 DIAGNOSIS — M79672 Pain in left foot: Secondary | ICD-10-CM

## 2021-05-05 DIAGNOSIS — M25572 Pain in left ankle and joints of left foot: Secondary | ICD-10-CM | POA: Diagnosis not present

## 2021-05-05 DIAGNOSIS — M25371 Other instability, right ankle: Secondary | ICD-10-CM

## 2021-05-05 DIAGNOSIS — T733XXA Exhaustion due to excessive exertion, initial encounter: Secondary | ICD-10-CM

## 2021-05-05 DIAGNOSIS — D508 Other iron deficiency anemias: Secondary | ICD-10-CM

## 2021-05-05 DIAGNOSIS — M25372 Other instability, left ankle: Secondary | ICD-10-CM

## 2021-05-05 DIAGNOSIS — E559 Vitamin D deficiency, unspecified: Secondary | ICD-10-CM

## 2021-05-05 DIAGNOSIS — M25571 Pain in right ankle and joints of right foot: Secondary | ICD-10-CM | POA: Diagnosis not present

## 2021-05-05 IMAGING — DX DG ANKLE COMPLETE 3+V*R*
3 series · 3 of 3 positions shown · non-contrast
Comparison: None.

CLINICAL DATA: Ankle instability.  Bilateral foot and ankle pain.

EXAM:
RIGHT ANKLE - COMPLETE 3+ VIEW

[dg ankle complete right (1 of 3)]
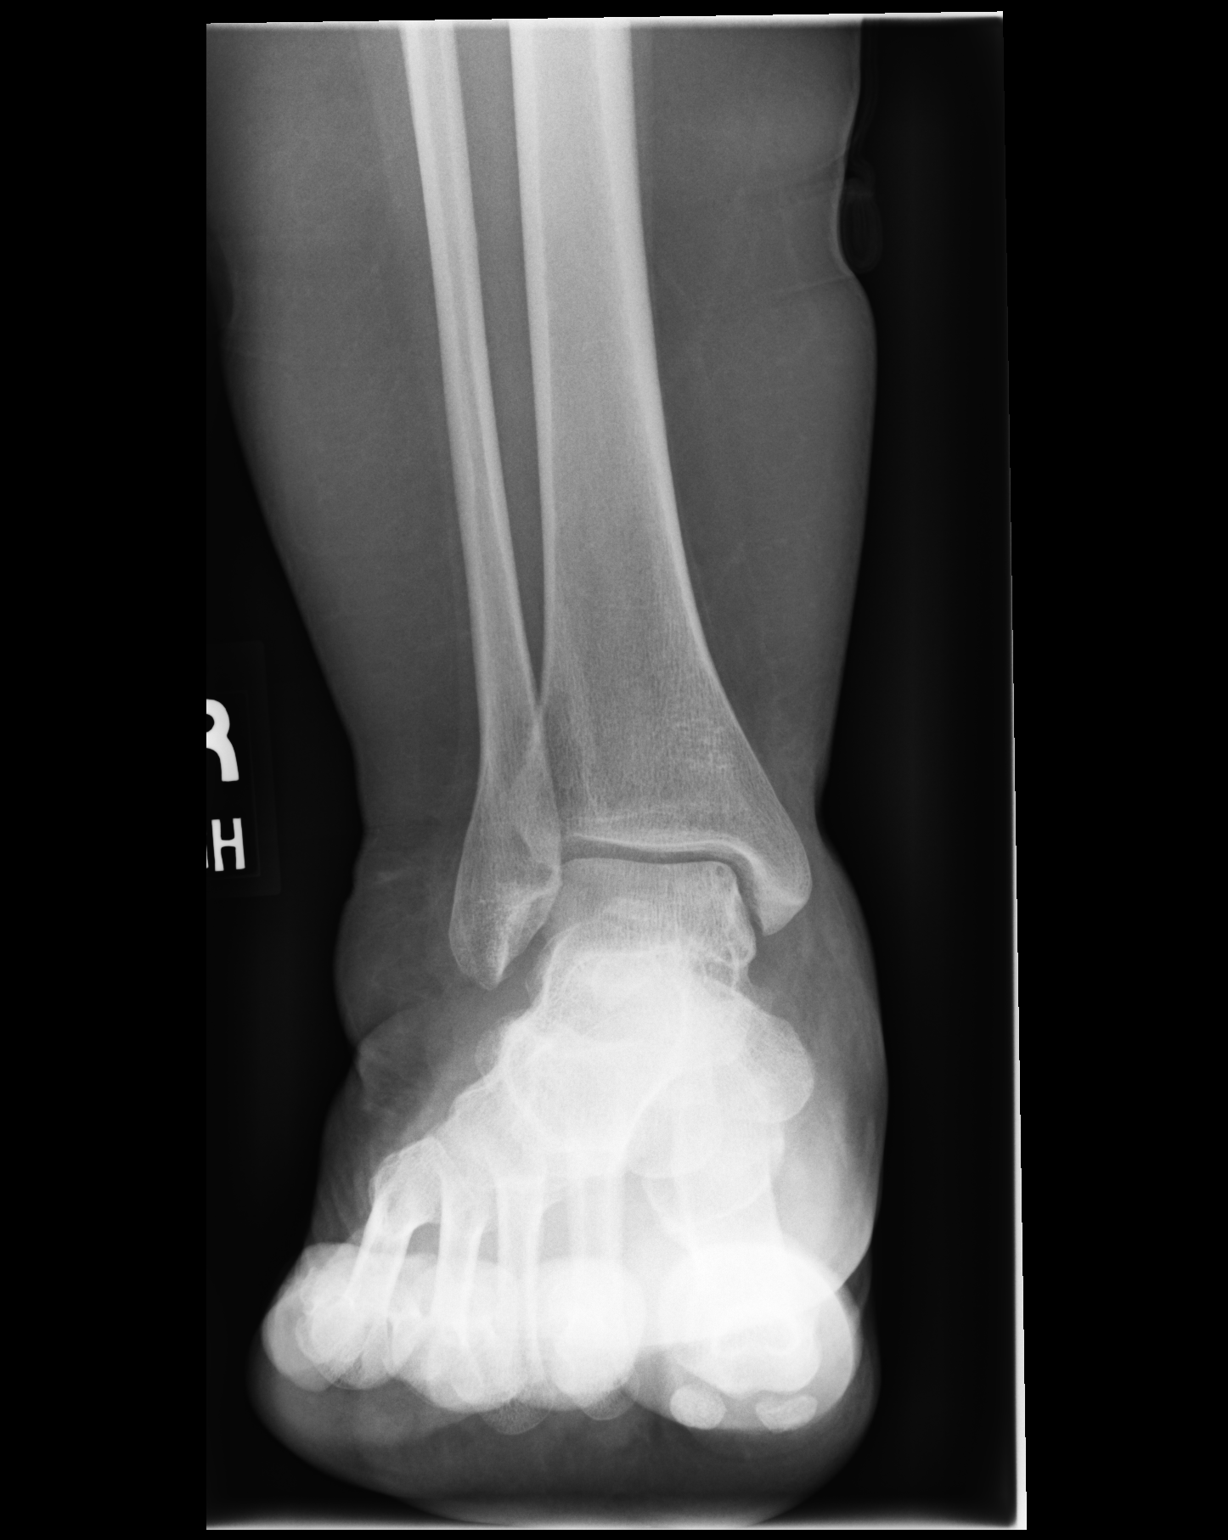

[dg ankle complete right (2 of 3)]
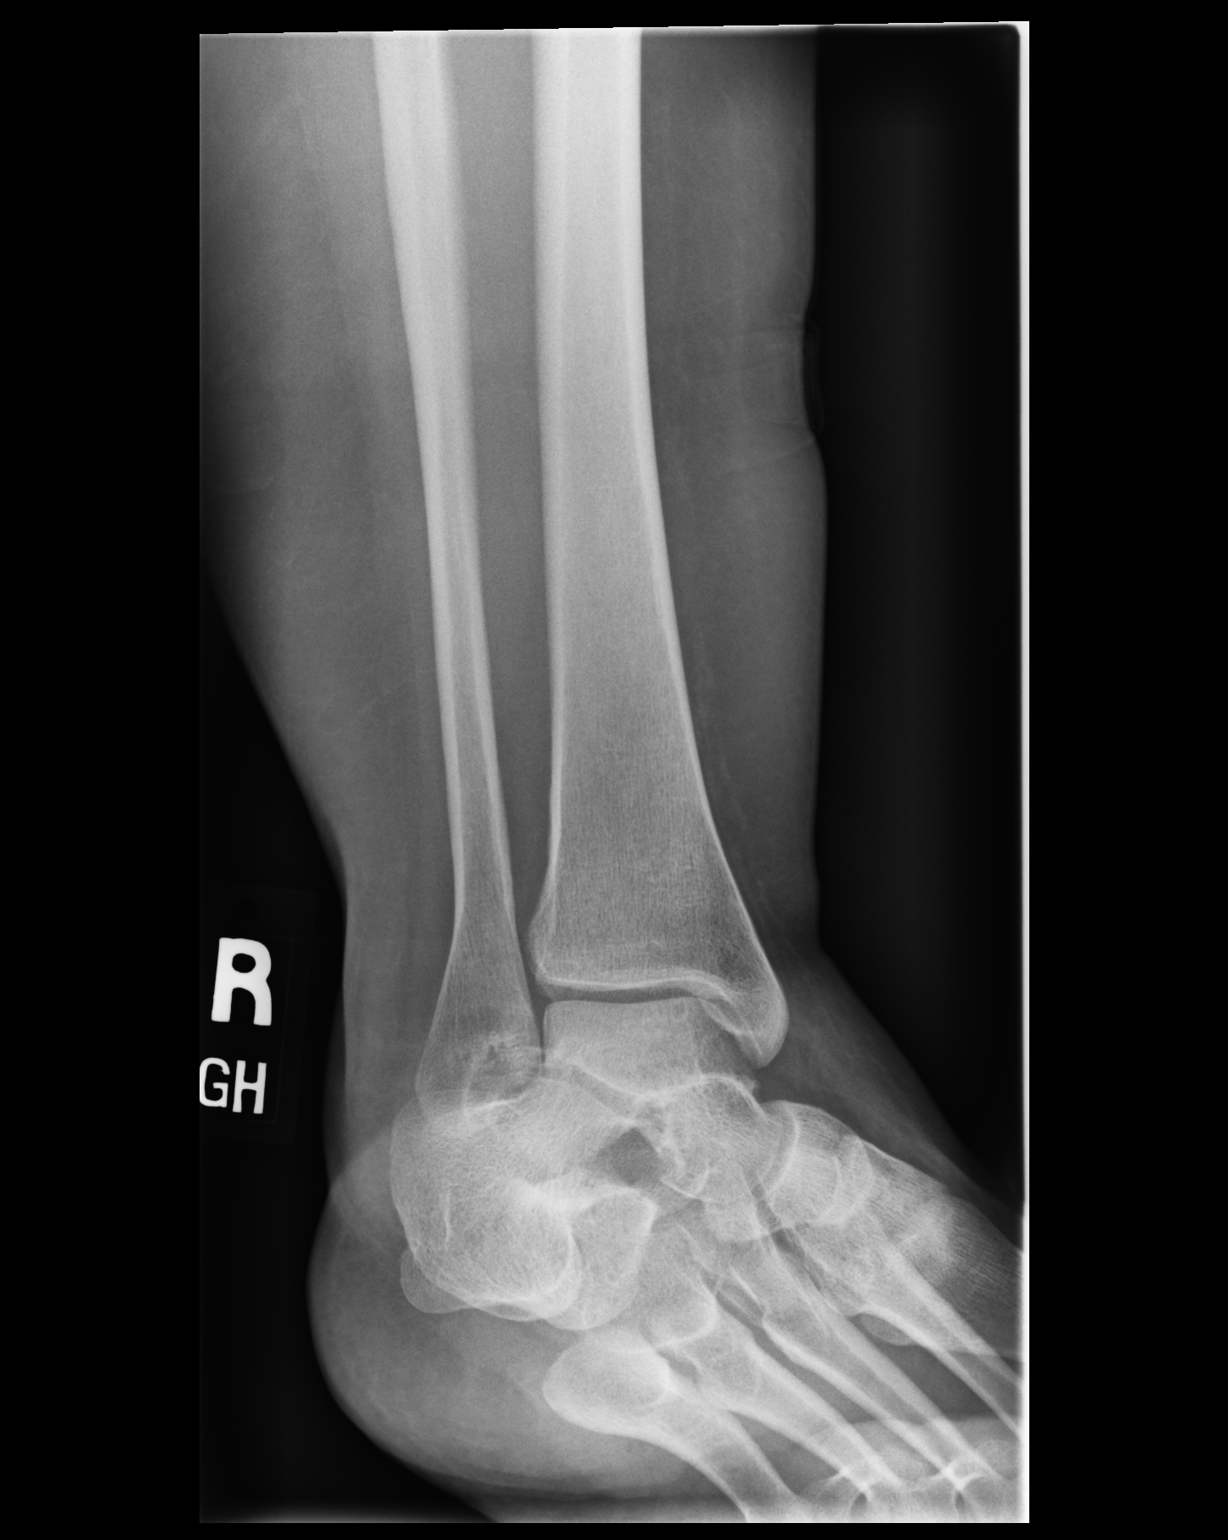

[dg ankle complete right (3 of 3)]
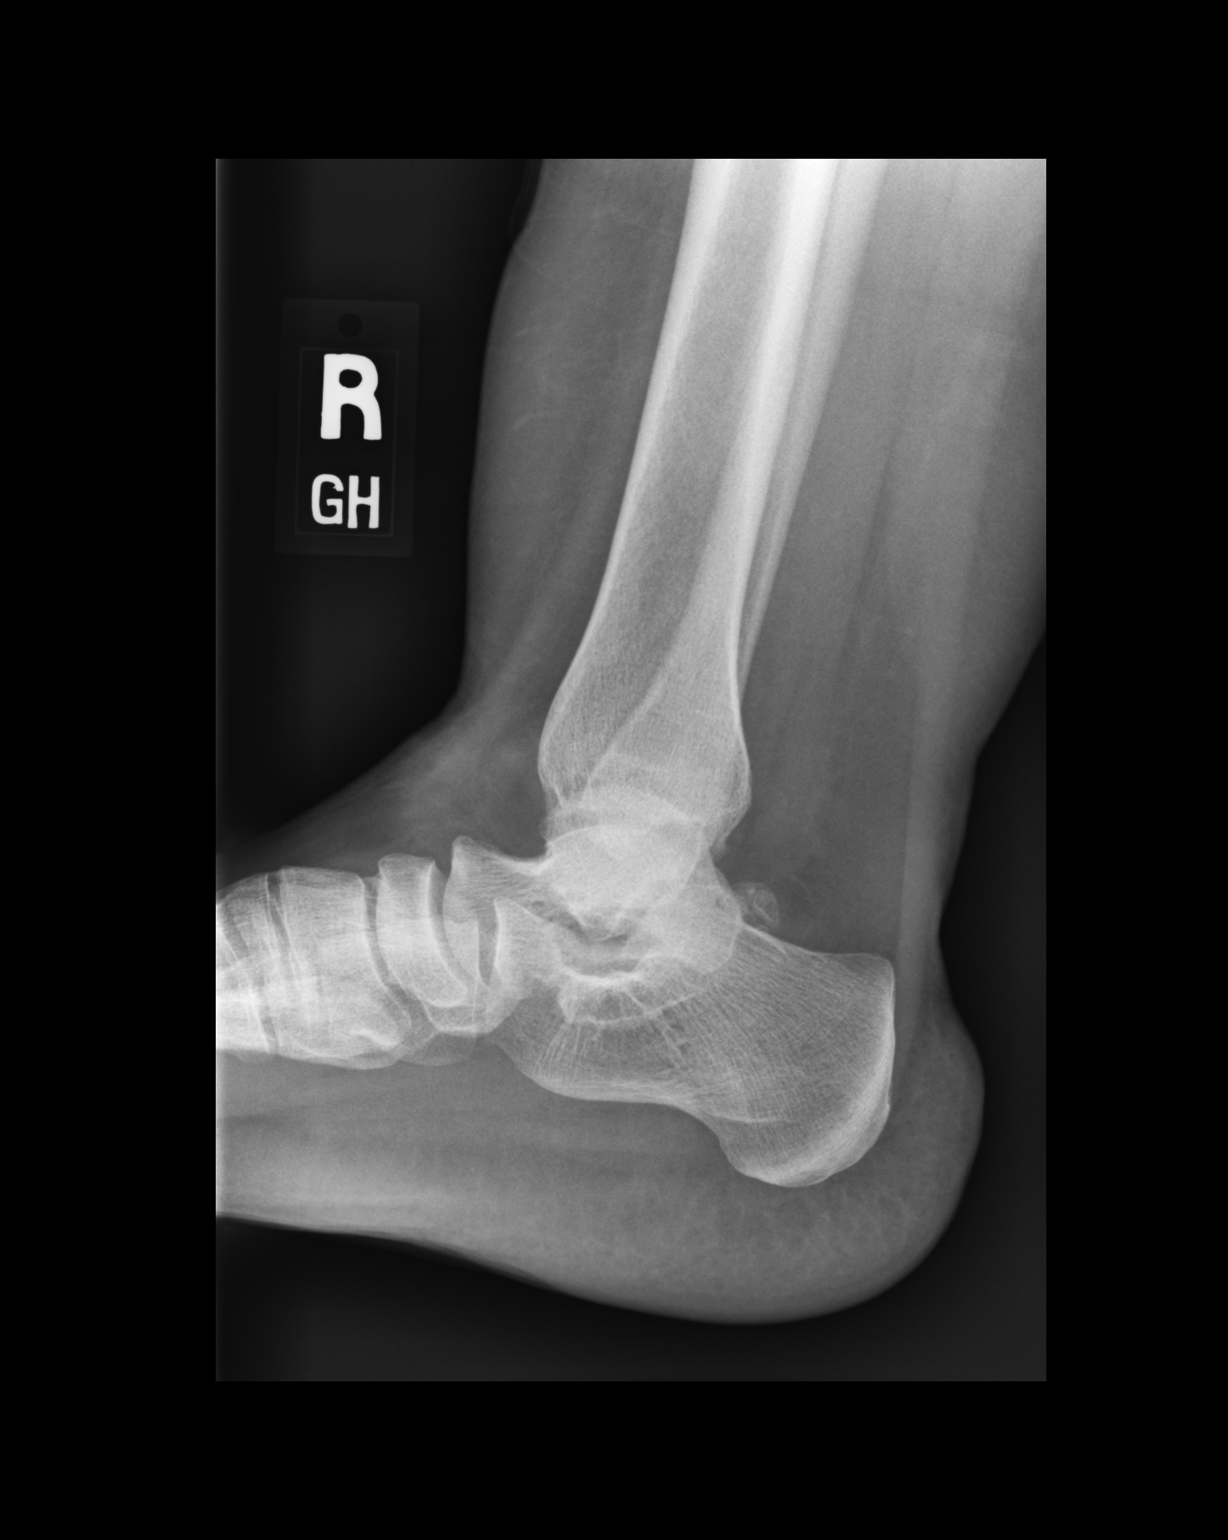

[3 of 3 positions shown; findings below may reference images not displayed]

FINDINGS: Normal alignment. Joint spaces are preserved. Subchondral cystic
change involving the medial talar dome without collapse. No mortise
widening. No ankle joint effusion. No fracture, erosion, or bone
destruction. Soft tissue edema versus habitus.
IMPRESSION: Subchondral cystic change involving the medial talar dome without
collapse may be degenerative or osteochondral lesion.

## 2021-05-05 IMAGING — DX DG ANKLE COMPLETE 3+V*L*
3 series · 3 of 3 positions shown · non-contrast
Comparison: None.

CLINICAL DATA: Ankle instability. Generalized bilateral foot and
ankle pain.

EXAM:
LEFT ANKLE COMPLETE - 3+ VIEW

[dg ankle complete left (1 of 3)]
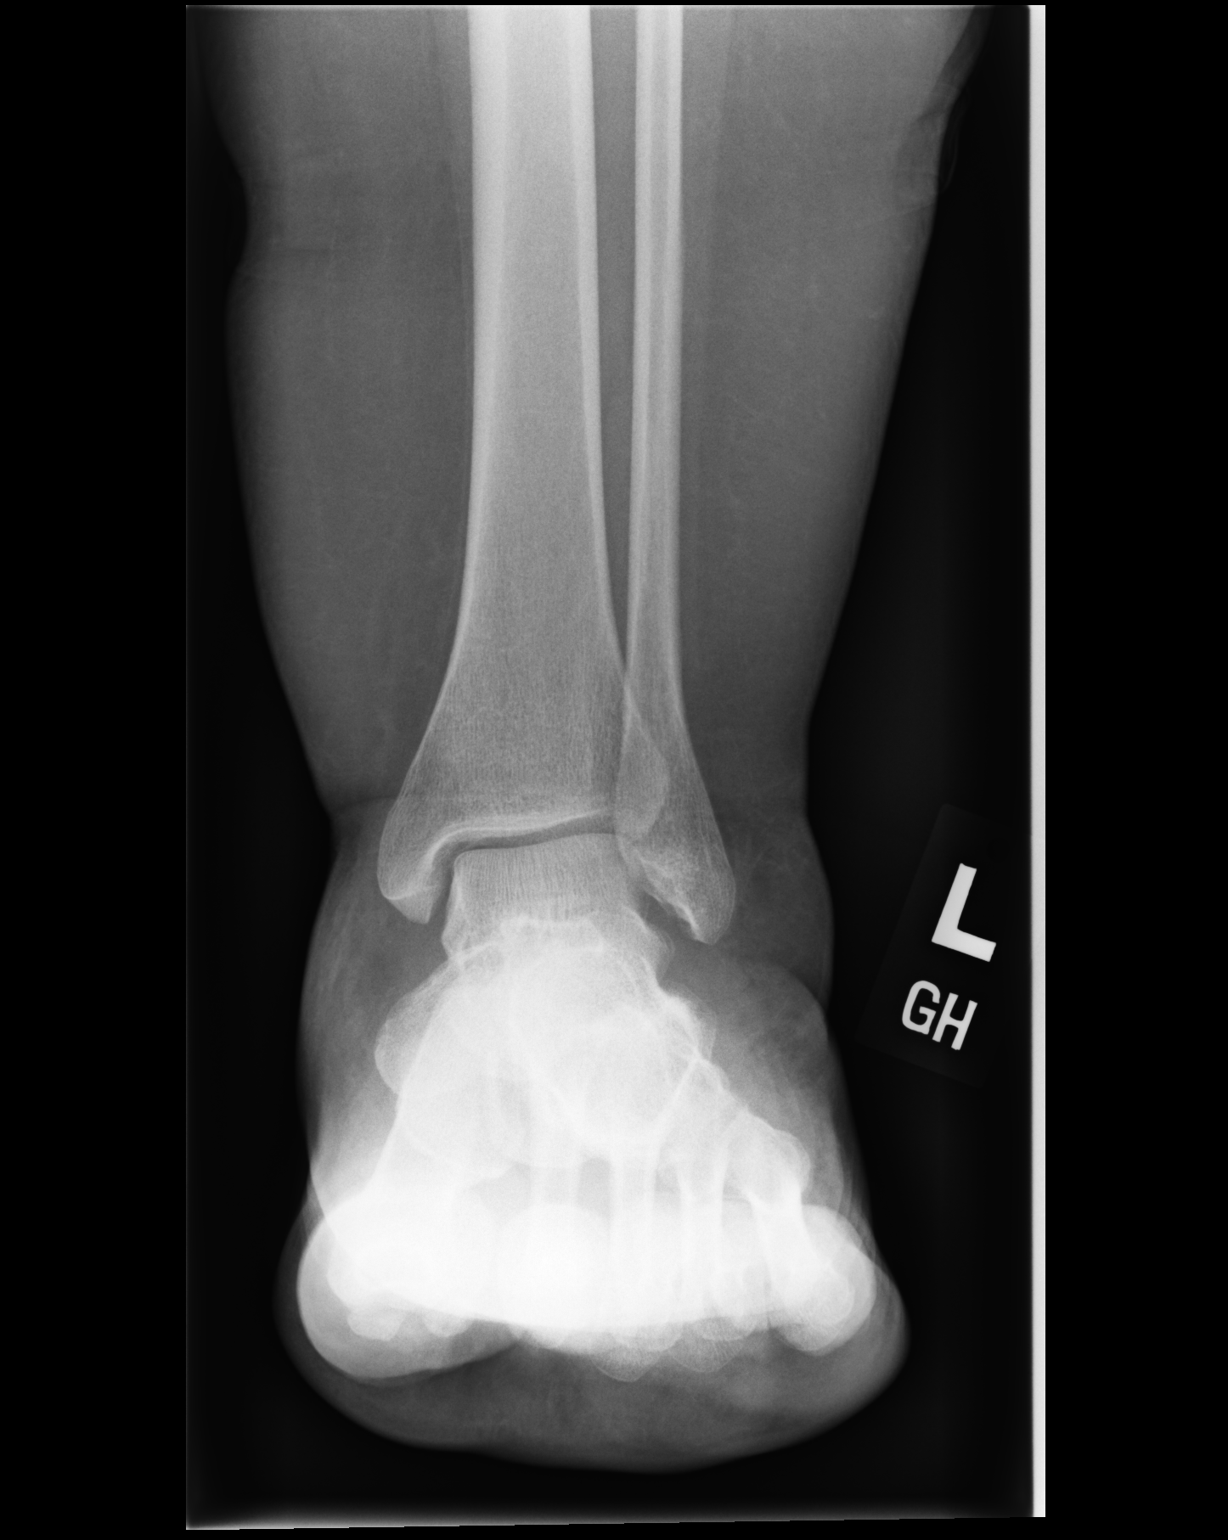

[dg ankle complete left (2 of 3)]
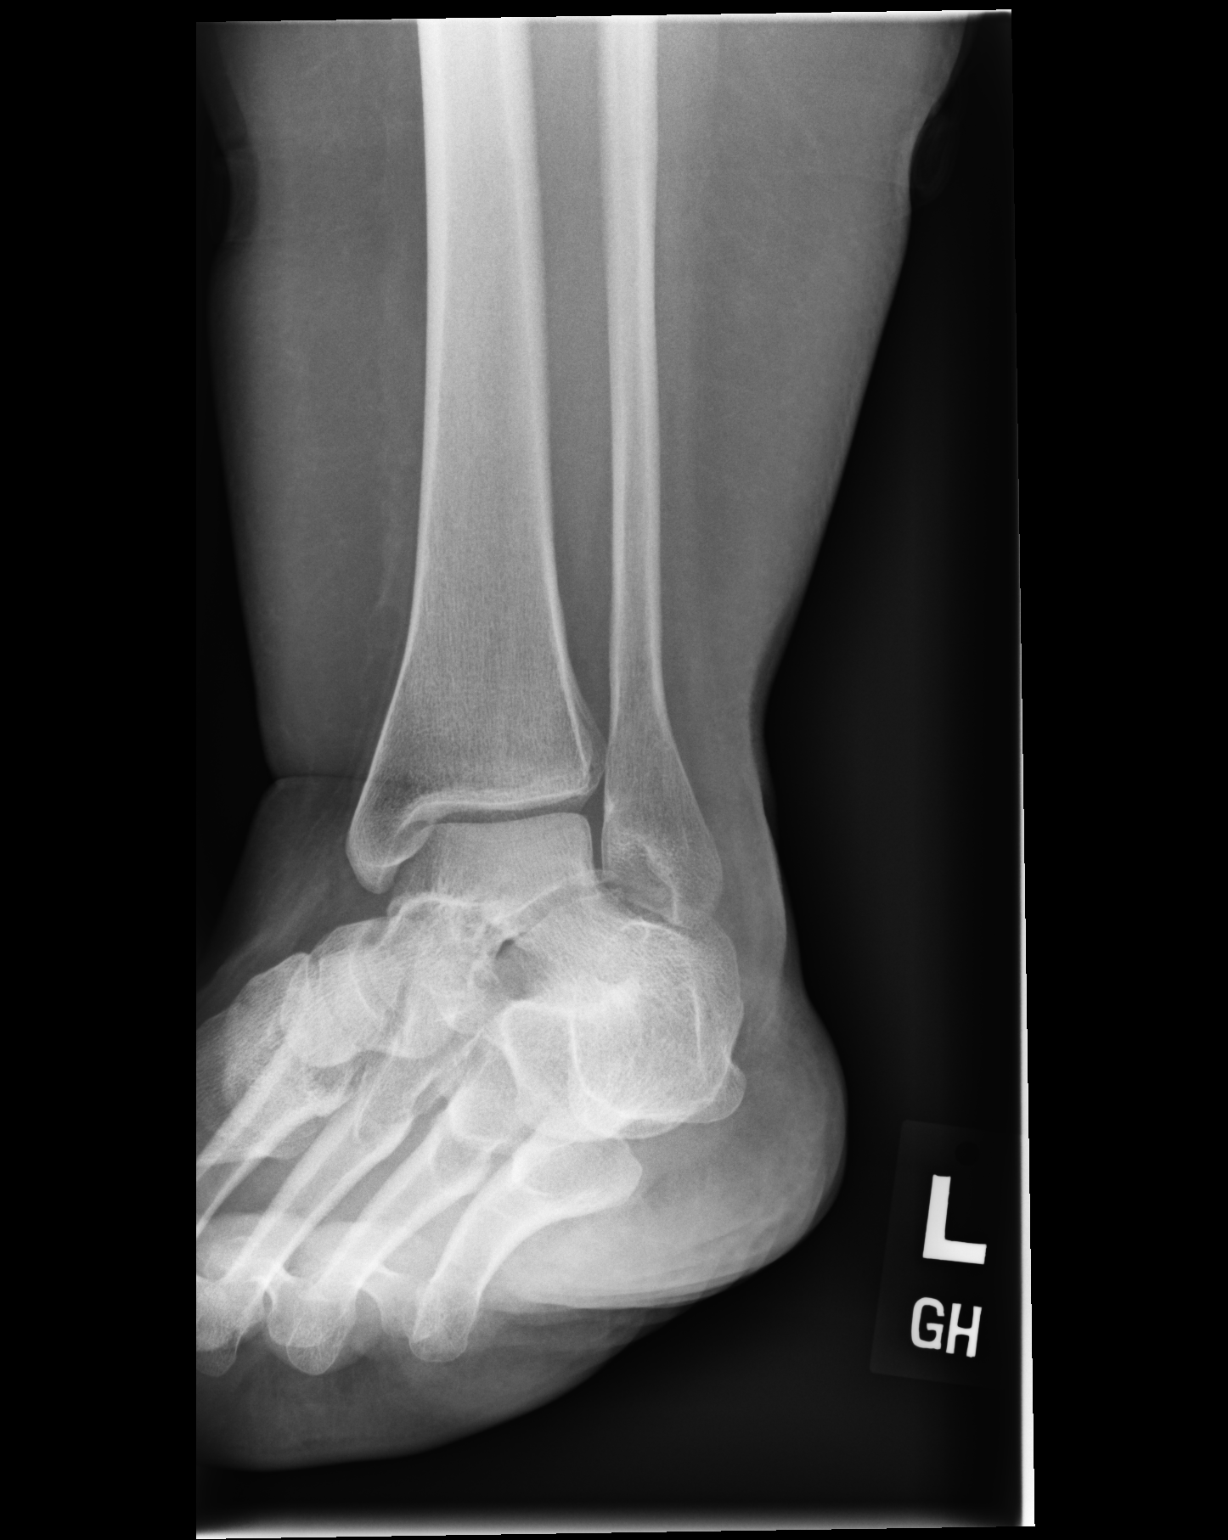

[dg ankle complete left (3 of 3)]
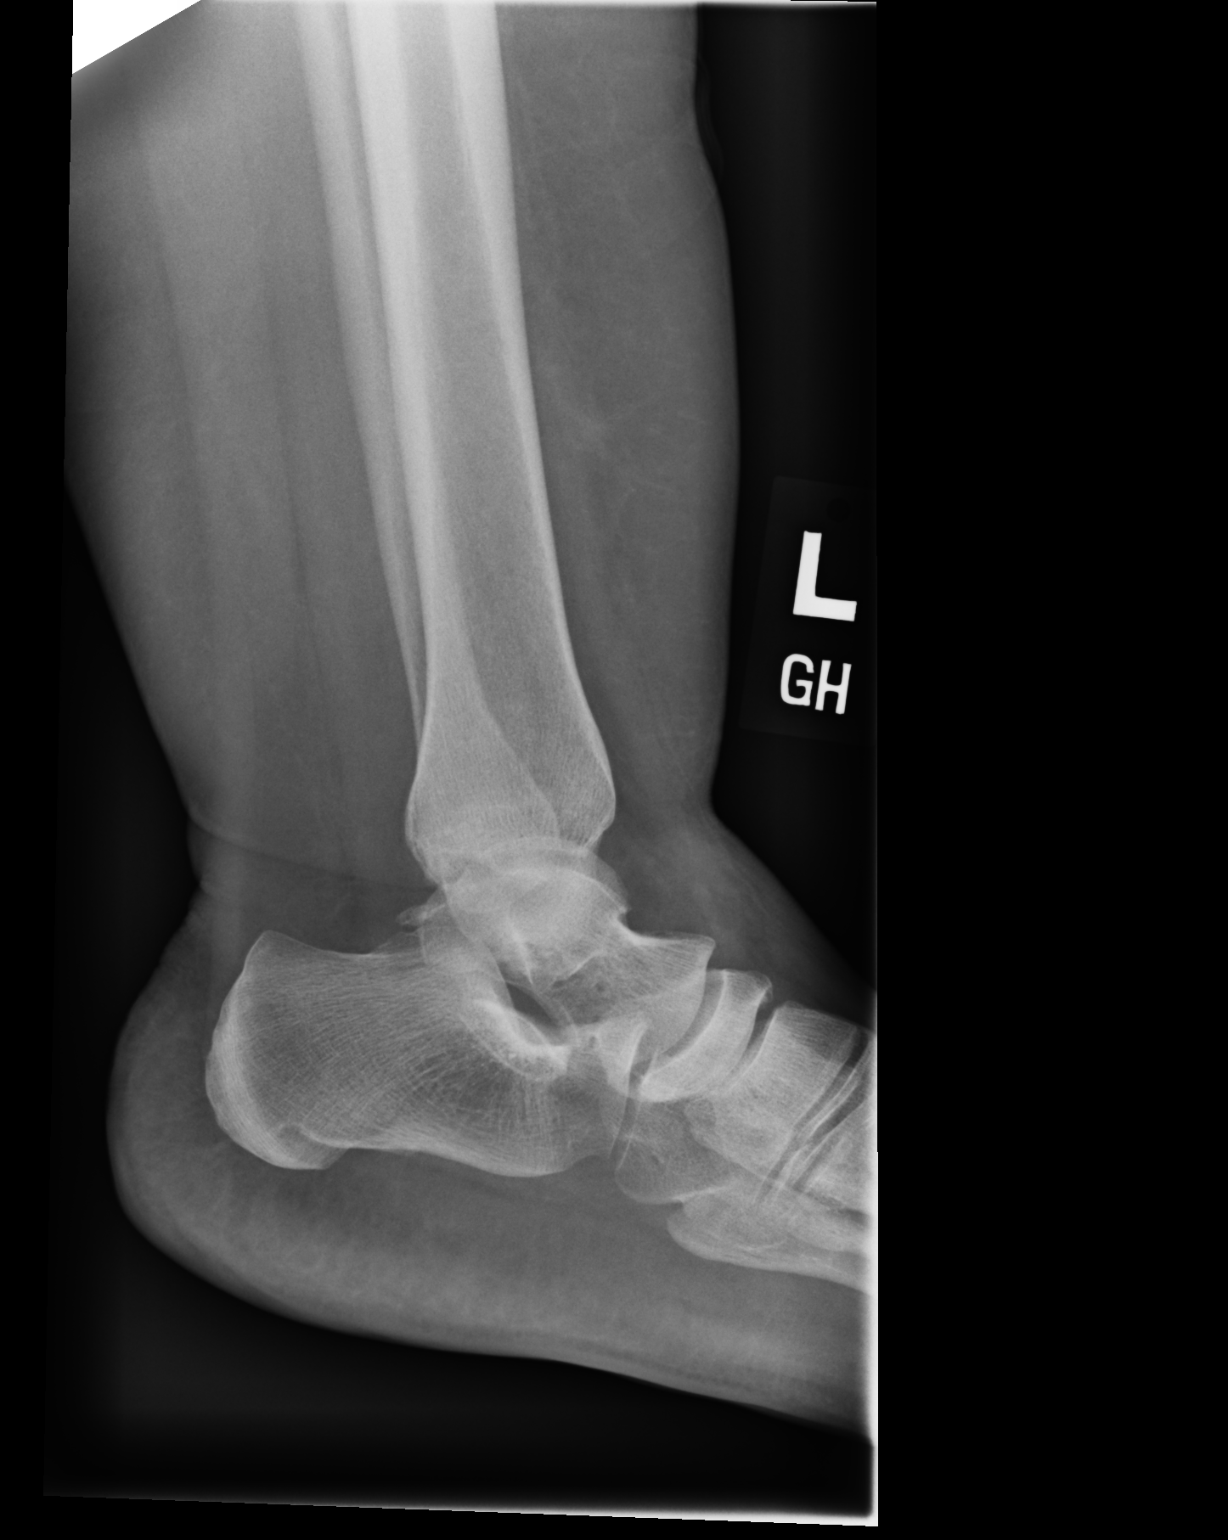

[3 of 3 positions shown; findings below may reference images not displayed]

FINDINGS: Normal alignment. Intact talar dome. Ankle mortise is preserved. No
osteophytes or erosion. No periosteal reaction or focal bone
abnormality. Mild soft tissue edema versus habitus. No ankle joint
effusion
IMPRESSION: Unremarkable radiographs of the left ankle.

## 2021-05-05 IMAGING — DX DG FOOT COMPLETE 3+V*L*
3 series · 3 of 3 positions shown · non-contrast
Comparison: None.

CLINICAL DATA: Bilateral foot and ankle pain.

EXAM:
LEFT FOOT - COMPLETE 3+ VIEW

[dg foot complete left (1 of 3)]
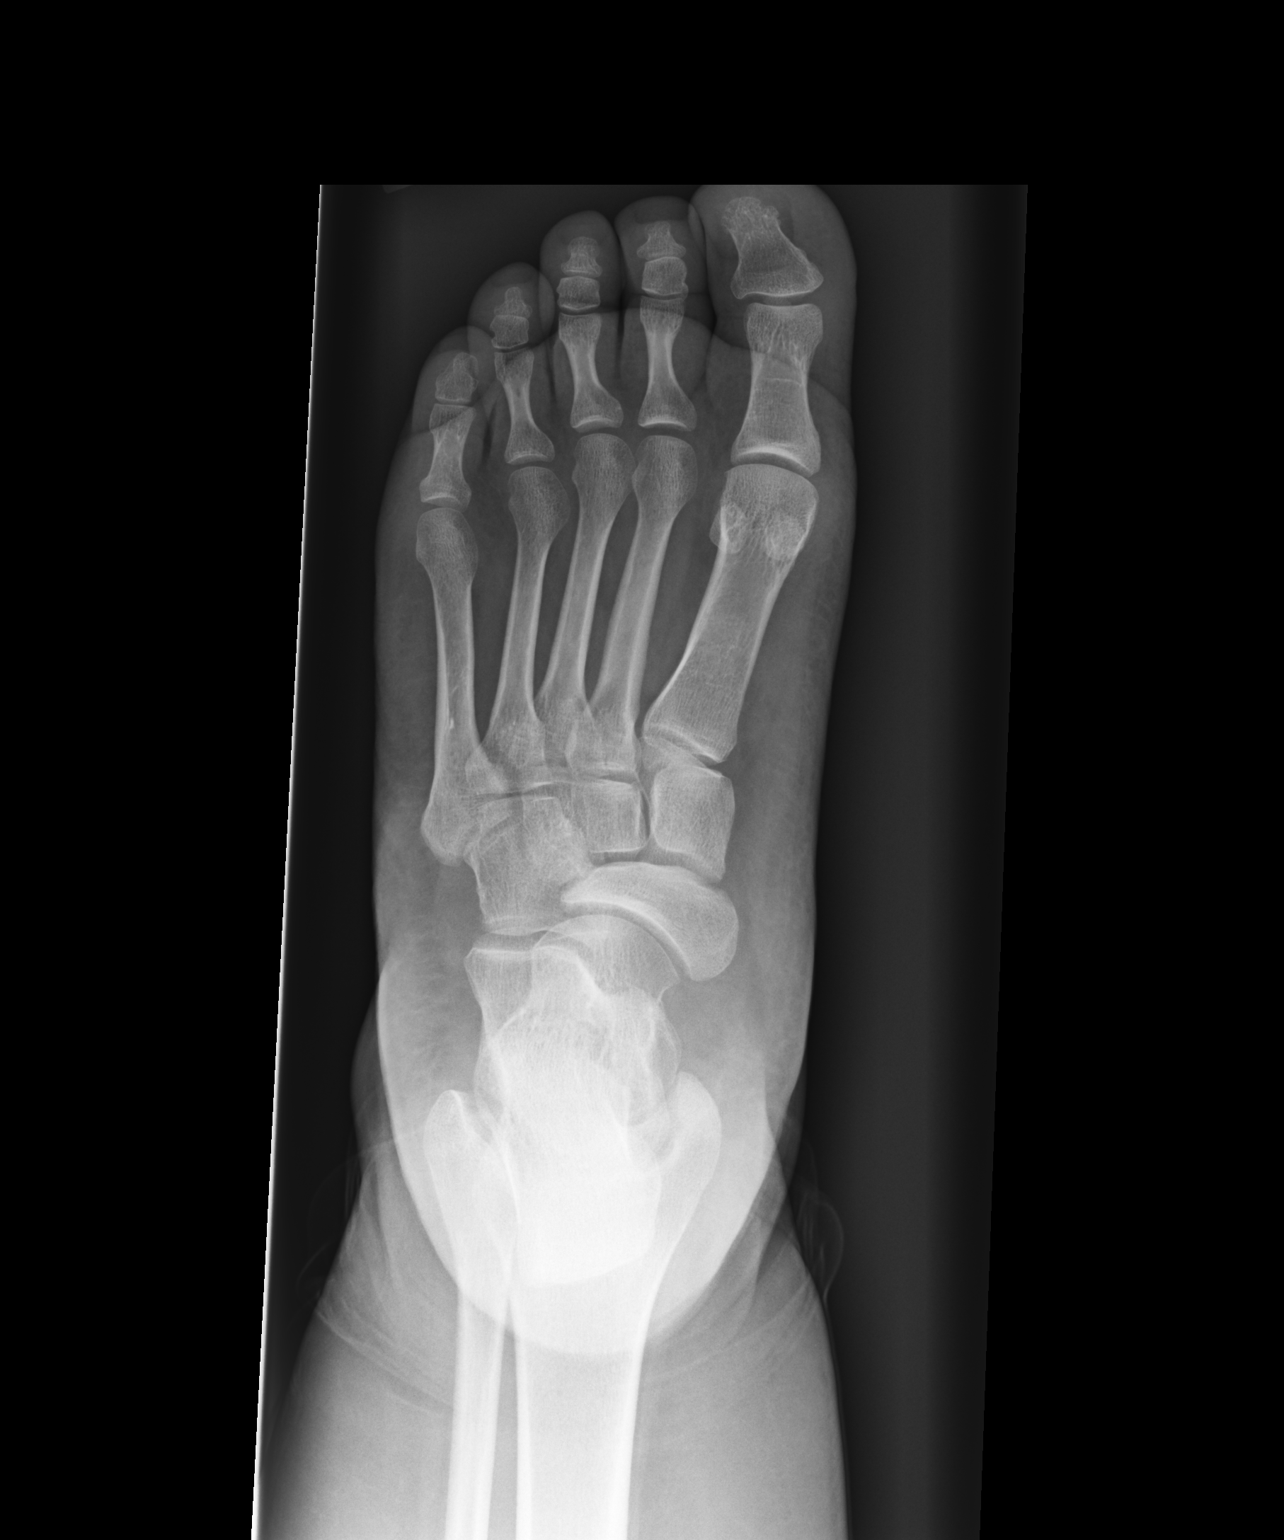

[dg foot complete left (2 of 3)]
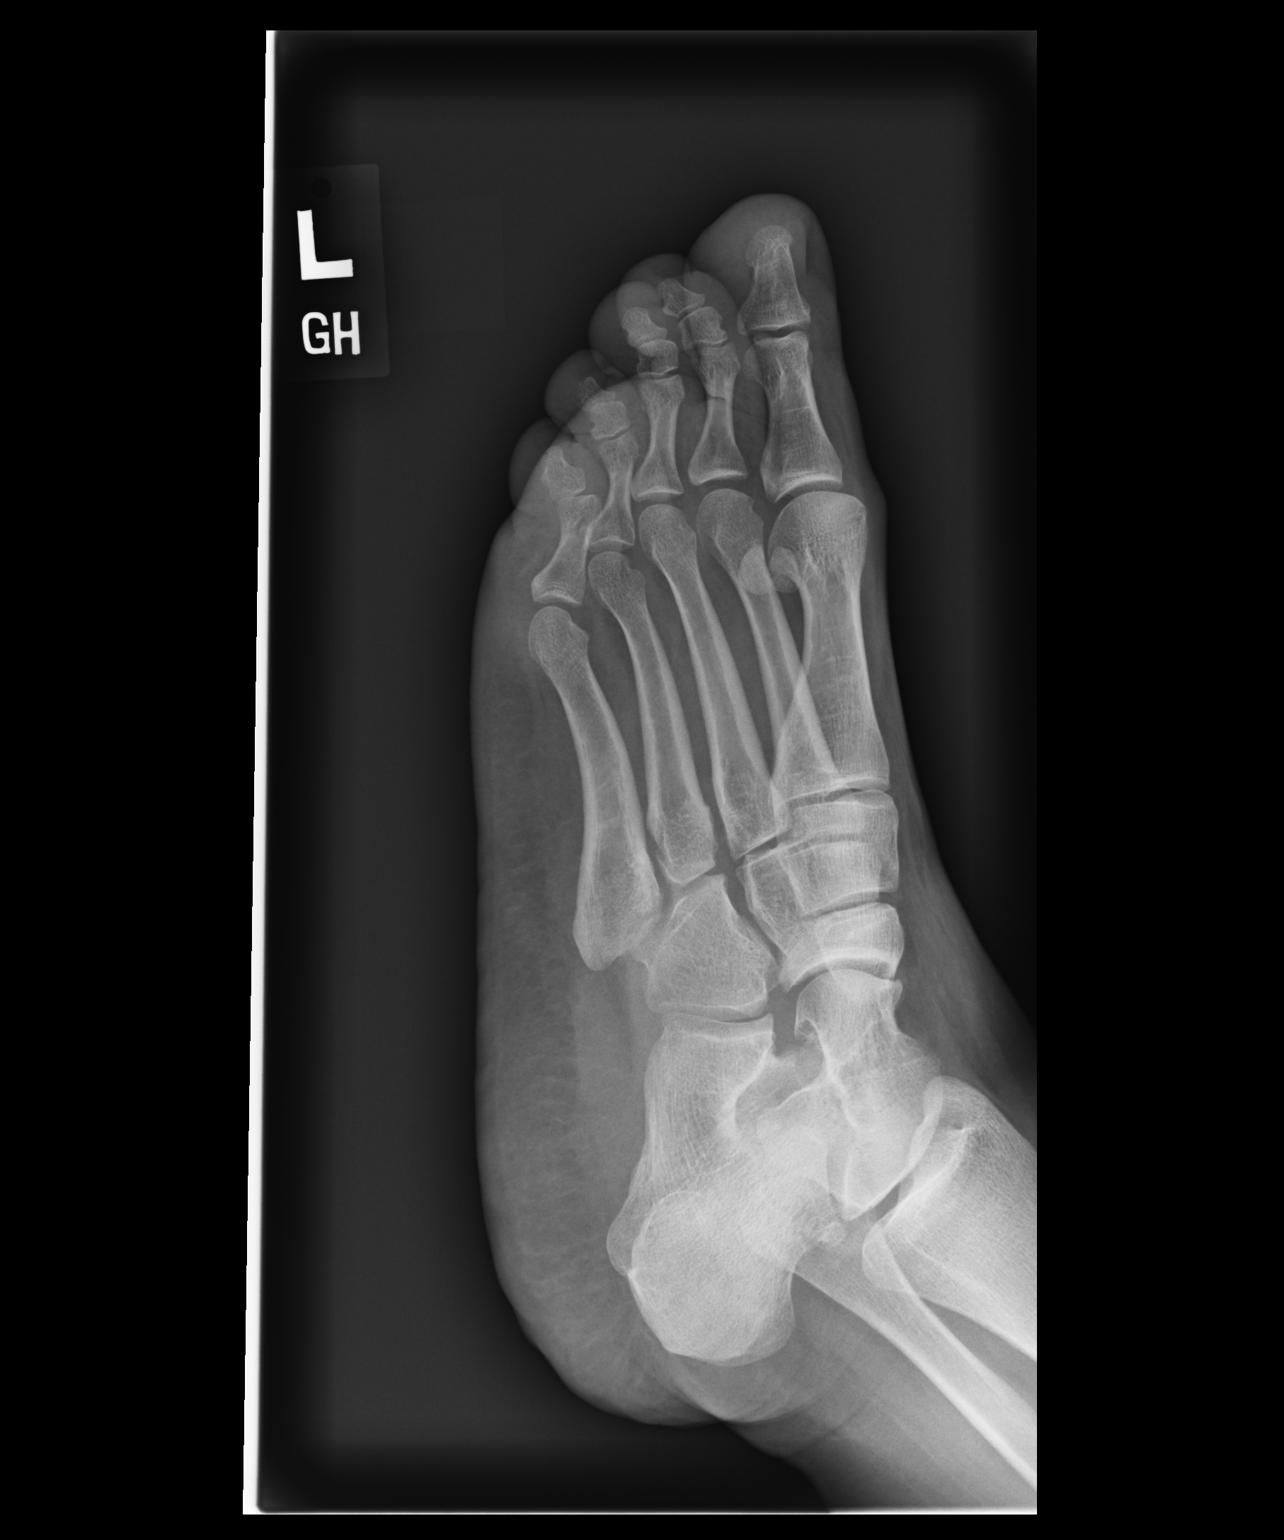

[dg foot complete left (3 of 3)]
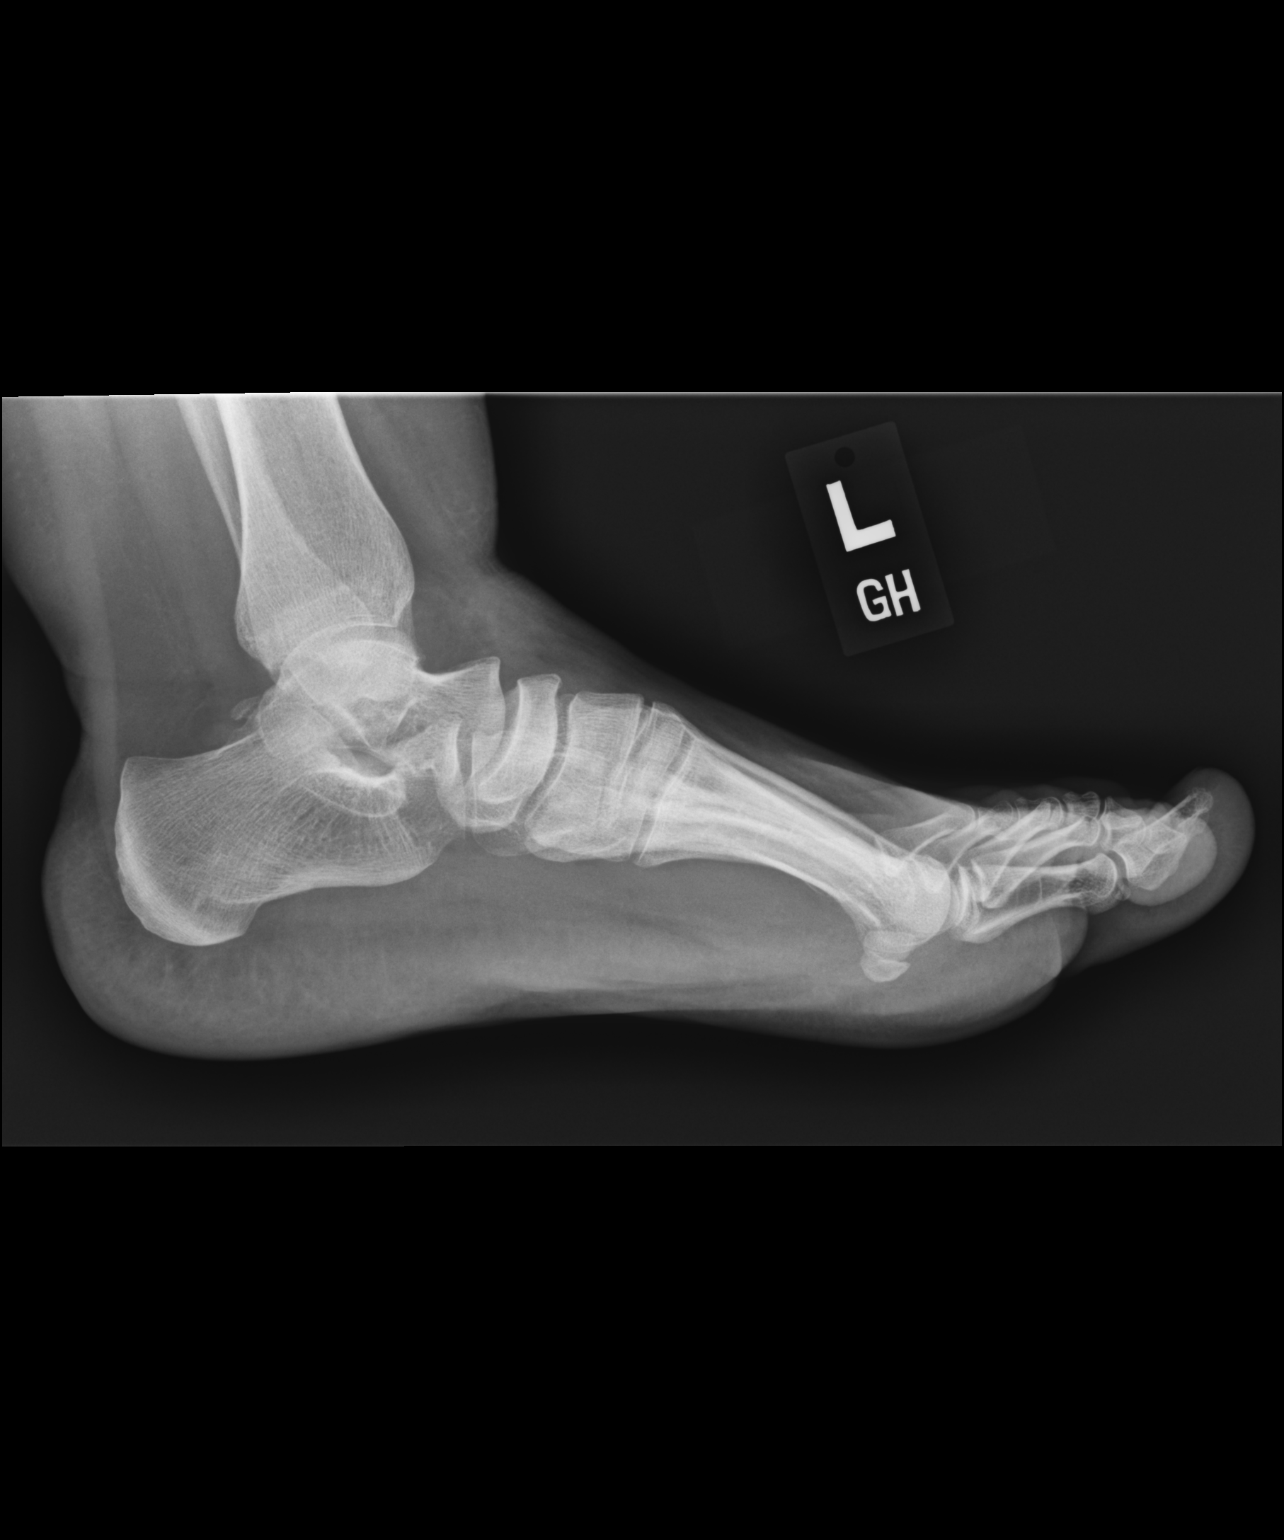

[3 of 3 positions shown; findings below may reference images not displayed]

FINDINGS: Normal alignment. The joint spaces are preserved. No fracture,
erosion, osteophytes or focal bone abnormality. Unremarkable soft
tissues.
IMPRESSION: Unremarkable radiographs of the left foot.

## 2021-05-05 NOTE — Patient Instructions (Addendum)
We will collect vitamin D and hemoglobin levels as well as your cholesterol.   I have ordered xrays of your feet and ankles to assess for the changes and pain that we discuss today.   Please go to [address], [employer] for [employer].   I will also place a referral to sports medicine. You can also contact that office to schedule an appointment if that is more convenient for you. [phone]   Please follow up with our office in 2-3 weeks to follow up on your foot pain.

## 2021-05-05 NOTE — Progress Notes (Signed)
    SUBJECTIVE:   CHIEF COMPLAINT / HPI: foot pain and desire for labs   Patient reports that she had flat feet,she was evaluated by podiatry and was recommended for supportive foot wear. She has since began to have pain on the sole of her foot and the pain has now started affecting both feet. She reports that she feels unsteady in her ankle joint. She states that she feels she has to walk with her feet supinated in order to have decreased pain in her feet.  She states that plantar fascitis is not as bothersome for her.   Vitamin D deficiency  Patient requests to have vitamin D levels checked. States she has hx of low vitamin D levels in the past.   IDA  Patient reports hx of IDA and prior iron supplementation. She states that she would like to check her levels with lab work today. She also reports feeling fatigue despite resting.   PERTINENT  PMH / PSH:  Chronic Back Pain  Vitamin D deficiency  IDA   OBJECTIVE:   BP 124/88   Pulse (!) 52   Ht 5\' 5"  (1.651 m)   Wt 201 lb 3.2 oz (91.3 kg)   LMP 04/18/2021   SpO2 100%   BMI 33.48 kg/m   Feet: Inspection:  No obvious bony deformity.  No swelling, erythema, or bruising on feet, edematous ankles.  Pes planus Palpation: No tenderness to palpation along achilles tendons bilaterally, no heel tenderness  ROM: Full  ROM of the ankle. Normal midfoot flexibility Strength: 5/5 strength ankle in all planes Neurovascular: N/V intact distally in the lower extremity Special tests: Negative anterior drawer. Negative squeeze. normal midfoot flexibility.  ASSESSMENT/PLAN:   Pain in both feet Xrays ordered for both feet  Potential charcot foot based on plantar distribution of pain Could also consider stress fracture or worsening plantar fascitis as cause of pain   Iron deficiency anemia Will check hgb with CBC   Vitamin D deficiency Will check Vitamin D levels per patient request   Fatigue due to excessive exertion Patient requests lab  work as she has been experiencing fatigue  Will start with CBC and TSH measurements today      04/20/2021, MD [facility]

## 2021-05-06 ENCOUNTER — Encounter: Payer: Self-pay | Admitting: Family Medicine

## 2021-05-06 DIAGNOSIS — Z01411 Encounter for gynecological examination (general) (routine) with abnormal findings: Secondary | ICD-10-CM | POA: Diagnosis not present

## 2021-05-06 DIAGNOSIS — Z124 Encounter for screening for malignant neoplasm of cervix: Secondary | ICD-10-CM | POA: Diagnosis not present

## 2021-05-06 DIAGNOSIS — Z3009 Encounter for other general counseling and advice on contraception: Secondary | ICD-10-CM | POA: Diagnosis not present

## 2021-05-06 DIAGNOSIS — N946 Dysmenorrhea, unspecified: Secondary | ICD-10-CM | POA: Diagnosis not present

## 2021-05-06 DIAGNOSIS — E559 Vitamin D deficiency, unspecified: Secondary | ICD-10-CM

## 2021-05-06 DIAGNOSIS — Z0000 Encounter for general adult medical examination without abnormal findings: Secondary | ICD-10-CM | POA: Diagnosis not present

## 2021-05-06 DIAGNOSIS — Z6836 Body mass index (BMI) 36.0-36.9, adult: Secondary | ICD-10-CM | POA: Diagnosis not present

## 2021-05-06 DIAGNOSIS — N9089 Other specified noninflammatory disorders of vulva and perineum: Secondary | ICD-10-CM | POA: Diagnosis not present

## 2021-05-06 DIAGNOSIS — B181 Chronic viral hepatitis B without delta-agent: Secondary | ICD-10-CM | POA: Diagnosis not present

## 2021-05-06 LAB — TSH: TSH: 1.26 u[IU]/mL (ref 0.450–4.500)

## 2021-05-06 LAB — LIPID PANEL
Chol/HDL Ratio: 2.6 ratio (ref 0.0–4.4)
Cholesterol, Total: 182 mg/dL (ref 100–199)
HDL: 69 mg/dL (ref >39)
LDL Chol Calc (NIH): 99 mg/dL (ref 0–99)
Triglycerides: 74 mg/dL (ref 0–149)
VLDL Cholesterol Cal: 14 mg/dL (ref 5–40)

## 2021-05-06 LAB — CBC
Hematocrit: 37.1 % (ref 34.0–46.6)
Hemoglobin: 11.3 g/dL (ref 11.1–15.9)
MCH: 22.6 pg — ABNORMAL LOW (ref 26.6–33.0)
MCHC: 30.5 g/dL — ABNORMAL LOW (ref 31.5–35.7)
MCV: 74 fL — ABNORMAL LOW (ref 79–97)
Platelets: 272 10*3/uL (ref 150–450)
RBC: 4.99 x10E6/uL (ref 3.77–5.28)
RDW: 14.3 % (ref 11.7–15.4)
WBC: 4 10*3/uL (ref 3.4–10.8)

## 2021-05-06 LAB — VITAMIN D 25 HYDROXY (VIT D DEFICIENCY, FRACTURES): Vit D, 25-Hydroxy: 21.7 ng/mL — ABNORMAL LOW (ref 30.0–100.0)

## 2021-05-06 MED ORDER — VITAMIN D3 10 MCG (400 UNIT) PO TABS: 800.0000 [IU] | ORAL_TABLET | Freq: Every day | ORAL | 6 refills | Status: DC

## 2021-05-07 DIAGNOSIS — M79672 Pain in left foot: Secondary | ICD-10-CM | POA: Insufficient documentation

## 2021-05-07 DIAGNOSIS — M79671 Pain in right foot: Secondary | ICD-10-CM | POA: Insufficient documentation

## 2021-05-07 DIAGNOSIS — T733XXA Exhaustion due to excessive exertion, initial encounter: Secondary | ICD-10-CM | POA: Insufficient documentation

## 2021-05-07 NOTE — Assessment & Plan Note (Signed)
Xrays ordered for both feet  Potential charcot foot based on plantar distribution of pain Could also consider stress fracture or worsening plantar fascitis as cause of pain

## 2021-05-07 NOTE — Assessment & Plan Note (Signed)
Will check hgb with CBC

## 2021-05-07 NOTE — Assessment & Plan Note (Signed)
Patient requests lab work as she has been experiencing fatigue  Will start with CBC and TSH measurements today

## 2021-05-07 NOTE — Assessment & Plan Note (Signed)
Will check Vitamin D levels per patient request

## 2021-11-25 ENCOUNTER — Encounter: Payer: Self-pay | Admitting: *Deleted

## 2022-05-08 ENCOUNTER — Encounter: Payer: Medicaid Other | Admitting: Student

## 2022-05-25 ENCOUNTER — Ambulatory Visit (INDEPENDENT_AMBULATORY_CARE_PROVIDER_SITE_OTHER): Payer: Medicaid Other | Admitting: Student

## 2022-05-25 ENCOUNTER — Encounter: Payer: Self-pay | Admitting: Student

## 2022-05-25 VITALS — BP 110/70 | HR 84 | Ht 60.5 in | Wt 162.0 lb

## 2022-05-25 DIAGNOSIS — E669 Obesity, unspecified: Secondary | ICD-10-CM | POA: Diagnosis not present

## 2022-05-25 DIAGNOSIS — G5603 Carpal tunnel syndrome, bilateral upper limbs: Secondary | ICD-10-CM

## 2022-05-25 DIAGNOSIS — T733XXA Exhaustion due to excessive exertion, initial encounter: Secondary | ICD-10-CM

## 2022-05-25 DIAGNOSIS — Z131 Encounter for screening for diabetes mellitus: Secondary | ICD-10-CM

## 2022-05-25 DIAGNOSIS — H509 Unspecified strabismus: Secondary | ICD-10-CM | POA: Diagnosis not present

## 2022-05-25 DIAGNOSIS — E559 Vitamin D deficiency, unspecified: Secondary | ICD-10-CM

## 2022-05-25 DIAGNOSIS — D509 Iron deficiency anemia, unspecified: Secondary | ICD-10-CM | POA: Diagnosis not present

## 2022-05-25 DIAGNOSIS — E66811 Obesity, class 1: Secondary | ICD-10-CM

## 2022-05-25 MED ORDER — BLOOD GLUCOSE MONITOR KIT: PACK | 0 refills | Status: DC

## 2022-05-25 NOTE — Progress Notes (Signed)
    SUBJECTIVE:   CHIEF COMPLAINT / HPI:   Morgan [name] is a 36 year old female here to discuss the following complaints:  Vitamin D deficiency/fatigue Has not had a level checked recently, but does have well-documented history of vitamin D deficiency that is symptomatic.  She is currently taking 5,000 units every day with Magneisum 500 mg.  Bilateral carpal tunnel syndrome Has history of carpal tunnel release in 2018 and left wrist. Now having problems in her right hand and wrist.  Awakens at night due to symptoms. Has been wearing braces without relief.  Requesting referral for possible surgery in the right wrist.   History of strabismus, with repair Feels weakness in her extraocular muscles. Says that she has a history of "squint" and hyperactive extraocular muscles.  She says [place], that was complicated by an issue in which they shortened the muscles too much and now has issues with strabismus.   Pap Smear UTD- normal last year.  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 She has intentionally lost about 80 pounds in the past year, was on the keto diet and felt that it was sustainable but now has had weight plateau.  Now currently just doing a low-carb diet.  PERTINENT  PMH / PSH: Reviewed  OBJECTIVE:   BP 110/70   Pulse 84   Ht 5' 0.5" (1.537 m)   Wt 162 lb (73.5 kg)   LMP 04/21/2022   SpO2 98%   BMI 31.12 kg/m   General: Well-appearing, pleasant 36 year old female. CV: Regular rate and rhythm.  No murmurs appreciated. Respiratory: Normal work of breathing on room air.  No wheezing or crackles.  Good lung sounds throughout. Abdomen: Soft, nontender and nondistended. Extremities: Palpable distal pulses.  No extremity swelling.   ASSESSMENT/PLAN:   Carpal tunnel syndrome, bilateral Patient has failed conservative treatment with  wrist bracing having increasing symptoms in right wrist would like referral to hand surgeon. Has had previous EMG confirming carpal tunnel, per patient. Placed referral to hand surgery.  Vitamin D deficiency Obtain vitamin D level today.  Patient symptomatic. She is taking 5000 IU/day.  Likely does not need to take this high level, 2000 IU/day would be appropriate.   But we will follow-up on level.   Referral placed to ophthalmology for eye deviation.  Darral Dash, DO [facility]

## 2022-05-25 NOTE — Patient Instructions (Addendum)
It was great seeing you today.  You are doing a wonderful job caring for yourself.    I placed a referral to ophthalmology, orthopedic surgery, and our community healthy weight and wellness clinic.  If you do not hear from any of these clinics by June 22, 2022, please let us know.  We collected testing today- I will call you if it is abnormal. If your results are normal, I will send you a MyChart message.   If you have any questions or concerns, please feel free to call the clinic.   Have a wonderful day,  Dr. Darral Dash [facility] [phone]

## 2022-05-25 NOTE — Assessment & Plan Note (Signed)
Patient has failed conservative treatment with wrist bracing having increasing symptoms in right wrist would like referral to hand surgeon. Has had previous EMG confirming carpal tunnel, per patient. Placed referral to hand surgery.

## 2022-05-25 NOTE — Assessment & Plan Note (Signed)
Obtain vitamin D level today.  Patient symptomatic. She is taking 5000 IU/day.  Likely does not need to take this high level, 2000 IU/day would be appropriate.   But we will follow-up on level.

## 2022-05-26 LAB — CBC
Hematocrit: 35.1 % (ref 34.0–46.6)
Hemoglobin: 10.7 g/dL — ABNORMAL LOW (ref 11.1–15.9)
MCH: 21.2 pg — ABNORMAL LOW (ref 26.6–33.0)
MCHC: 30.5 g/dL — ABNORMAL LOW (ref 31.5–35.7)
MCV: 70 fL — ABNORMAL LOW (ref 79–97)
Platelets: 317 10*3/uL (ref 150–450)
RBC: 5.05 x10E6/uL (ref 3.77–5.28)
RDW: 13.2 % (ref 11.7–15.4)
WBC: 4.4 10*3/uL (ref 3.4–10.8)

## 2022-05-26 LAB — HEMOGLOBIN A1C
Est. average glucose Bld gHb Est-mCnc: 111 mg/dL
Hgb A1c MFr Bld: 5.5 % (ref 4.8–5.6)

## 2022-05-26 LAB — TSH: TSH: 1.91 u[IU]/mL (ref 0.450–4.500)

## 2022-05-26 LAB — VITAMIN D 25 HYDROXY (VIT D DEFICIENCY, FRACTURES): Vit D, 25-Hydroxy: 103 ng/mL — ABNORMAL HIGH (ref 30.0–100.0)

## 2022-05-28 ENCOUNTER — Encounter: Payer: Self-pay | Admitting: Student

## 2022-06-04 DIAGNOSIS — G5601 Carpal tunnel syndrome, right upper limb: Secondary | ICD-10-CM | POA: Diagnosis not present

## 2022-06-04 DIAGNOSIS — M25532 Pain in left wrist: Secondary | ICD-10-CM | POA: Diagnosis not present

## 2022-07-02 ENCOUNTER — Encounter: Payer: Self-pay | Admitting: Student

## 2022-07-02 ENCOUNTER — Ambulatory Visit: Payer: Medicaid Other | Admitting: Student

## 2022-07-02 ENCOUNTER — Ambulatory Visit (HOSPITAL_COMMUNITY)
Admission: RE | Admit: 2022-07-02 | Disposition: A | Discharge status: Home / Self Care | Payer: Medicaid Other | Source: Ambulatory Visit

## 2022-07-02 VITALS — BP 112/66 | HR 68 | Wt 158.0 lb

## 2022-07-02 DIAGNOSIS — D509 Iron deficiency anemia, unspecified: Secondary | ICD-10-CM

## 2022-07-02 DIAGNOSIS — R000 Tachycardia, unspecified: Secondary | ICD-10-CM | POA: Insufficient documentation

## 2022-07-02 DIAGNOSIS — H509 Unspecified strabismus: Secondary | ICD-10-CM | POA: Diagnosis not present

## 2022-07-02 DIAGNOSIS — D649 Anemia, unspecified: Secondary | ICD-10-CM

## 2022-07-02 DIAGNOSIS — E559 Vitamin D deficiency, unspecified: Secondary | ICD-10-CM | POA: Diagnosis not present

## 2022-07-02 NOTE — Assessment & Plan Note (Signed)
Check CBC with hemoglobin today. Continue iron supplement.

## 2022-07-02 NOTE — Assessment & Plan Note (Signed)
Young, generally healthy 37 year old female with episodes of tachycardia/palpitations and intermittent shortness of breath. Question if she has SVT during these episodes. EKG showed sinus bradycardia with heart rate 52.  No signs of atrial flutter or heart block.  No acute ST changes. Placed referral to cardiology.  Discussed she may need to wear a Zio patch or use other monitoring device Encouraged to continue to take her iron pill for anemia, which may be contributing to palpitations. Also encouraged her to have plenty of oral fluid hydration.

## 2022-07-02 NOTE — Progress Notes (Signed)
    SUBJECTIVE:   CHIEF COMPLAINT / HPI:   Morgan [name] is a 37 year-old female here for fatigue and tachycardia. She says she is so exhausted she can barely even do her chores or focus on her son. Having tachycardia up to 160s just sitting. When she had a heart rate to 178 while sleeping and it awoke. Denies any dyspnea. She is having some shortness of breath with walking. Denies any lower extremity edema  Vitamin D deficiency Was taking 5,000 IU a day and level exceeded normal range at last visit. Now taking 2,000 IU a day. Would like level checked today.  Heavy menstrual bleeding Period lasted 15 days most recently. Previous cycle was 19 days. Taking iron capsule a day. Has copper IUD placed 3 years ago, and she has an appointment coming up with OB/GYN to discuss removal and/or other contraceptive methods.   PERTINENT  PMH / PSH: Reviewed  OBJECTIVE:   BP 112/66   Pulse 68   Wt 158 lb (71.7 kg)   LMP 06/17/2022   SpO2 98%   BMI 30.35 kg/m   General: Well-appearing, no distress CV: Regular rate and rhythm.  No appreciable murmurs.  No rubs, clicks or gallops. Respiratory: Normal work of breathing on room air.  Lungs are clear in all fields to auscultation with no wheezing or crackles. Skin: Warm, dry, well-perfused Extremities: No edema in bilateral lower extremities.  Distal pulses palpable   ASSESSMENT/PLAN:   Tachycardia Young, generally healthy 37 year old female with episodes of tachycardia/palpitations and intermittent shortness of breath. Question if she has SVT during these episodes. EKG showed sinus bradycardia with heart rate 52.  No signs of atrial flutter or heart block.  No acute ST changes. Placed referral to cardiology.  Discussed she may need to wear a Zio patch or use other monitoring device Encouraged to continue to take her iron pill for anemia, which may be contributing to palpitations. Also encouraged her to have plenty of oral fluid  hydration.  Vitamin D deficiency Recheck vitamin D today.  Iron deficiency anemia Check CBC with hemoglobin today. Continue iron supplement.     Orvis Brill, [place]

## 2022-07-02 NOTE — Assessment & Plan Note (Signed)
Recheck vitamin D today 

## 2022-07-02 NOTE — Patient Instructions (Addendum)
It was great seeing you today.  I placed a referral to cardiology for evaluation of your tachycardia/palpitations. If you do not hear from a scheduler in the next 2 weeks, please call our clinic.  I also re-placed your ophthalmology referral.  We collected testing today for Vitamin D and hemoglobin- I will call you if it is abnormal. If your results are normal, I will send you a MyChart message.  I will follow up on your OBGYN notes on your decision about your IUD.  Have a wonderful day,  Dr. Orvis Brill [facility] [phone]

## 2022-07-03 LAB — CBC
Hematocrit: 34.2 % (ref 34.0–46.6)
Hemoglobin: 9.9 g/dL — ABNORMAL LOW (ref 11.1–15.9)
MCH: 20.6 pg — ABNORMAL LOW (ref 26.6–33.0)
MCHC: 28.9 g/dL — ABNORMAL LOW (ref 31.5–35.7)
MCV: 71 fL — ABNORMAL LOW (ref 79–97)
Platelets: 307 10*3/uL (ref 150–450)
RBC: 4.81 x10E6/uL (ref 3.77–5.28)
RDW: 13.7 % (ref 11.7–15.4)
WBC: 3.4 10*3/uL (ref 3.4–10.8)

## 2022-07-03 LAB — VITAMIN D 25 HYDROXY (VIT D DEFICIENCY, FRACTURES): Vit D, 25-Hydroxy: 47.6 ng/mL (ref 30.0–100.0)

## 2022-07-14 DIAGNOSIS — M25532 Pain in left wrist: Secondary | ICD-10-CM | POA: Diagnosis not present

## 2022-07-14 DIAGNOSIS — R2231 Localized swelling, mass and lump, right upper limb: Secondary | ICD-10-CM | POA: Diagnosis not present

## 2022-07-14 DIAGNOSIS — G5601 Carpal tunnel syndrome, right upper limb: Secondary | ICD-10-CM | POA: Diagnosis not present

## 2022-07-15 NOTE — Progress Notes (Unsigned)
Cardiology Office Note:    Date:  07/16/2022   ID:  Morgan [name], DOB 1985-06-28, MRN [id number]  PCP:  Leslie Dales, DO   [place] [facility] Cardiologist:  Lenna Sciara, MD Referring MD: Martyn Malay, MD   Chief Complaint/Reason for Referral: Palpitations  ASSESSMENT:    1. Palpitations     PLAN:    In order of problems listed above: 1.  Palpitations: We will obtain a monitor, CMP, and echocardiogram.  If needed we can trial a beta-blocker again or a calcium channel blocker; the patient did have issues with rebound tachycardia following a trial of beta-blocker in the past.  Otherwise the patient should modify stressors, avoid caffeine, and consider more aggressive treatment of iron deficiency anemia to keep hemoglobin levels in more of a normal range.            Dispo:  Return if symptoms worsen or fail to improve.      Medication Adjustments/Labs and Tests Ordered: Current medicines are reviewed at length with the patient today.  Concerns regarding medicines are outlined above.  The following changes have been made:  no change   Labs/tests ordered: Orders Placed This Encounter  Procedures   Comp Met (CMET)   Cardiac event monitor   ECHOCARDIOGRAM COMPLETE    Medication Changes: No orders of the defined types were placed in this encounter.    Current medicines are reviewed at length with the patient today.  The patient does not have concerns regarding medicines.   History of Present Illness:    FOCUSED PROBLEM LIST:   1.  Iron deficiency anemia  The patient is a 37 y.o. female with the indicated medical history here for recommendations regarding palpitations.  She recently was seen by her PCP for fatigue and tachycardia.  She says that her heart rate will be up to the 160s when she is just sitting.  She occasionally has some shortness of breath with walking.  TSH drawn in December 2023 was within normal limits.  The patient has had  trouble with palpitations for some time.  [place] 3 [place] that per her showed relatively benign findings.  She had another monitor in 2020 which was equally benign.  She tells me she has palpitations a few times a week where she feels like her heart rate is racing very fast up to 160 to 180 bpm and then can go down to 50 bpm.  She feels dizzy and short of breath with this and has to lay down at times.  She does tell me when her hemoglobin is better these are less frequent but she still will get them at times.  She fortunately has not had any syncope.  She denies any exertional angina, exertional dyspnea, or severe peripheral edema.  She does not smoke or drink.  She had been drinking 2 cups of [employer] coffee a day but is now down to 1.  She had been on a beta-blocker in the past because of palpitations but when she found that she missed a dose the rebound tachycardia was very debilitating.          Current Medications: No outpatient medications have been marked as taking for the 07/16/22 encounter (Office Visit) with Early Osmond, MD.     Allergies:    Patient has no known allergies.   Social History:   Social History   Tobacco Use   Smoking status: Never    Passive exposure: Never  Smokeless tobacco: Never  Substance Use Topics   Alcohol use: No    Alcohol/week: 0.0 standard drinks of alcohol   Drug use: No     Family Hx: Family History  Problem Relation Age of Onset   Alcohol abuse Neg Hx    Arthritis Neg Hx    Asthma Neg Hx    Birth defects Neg Hx    Cancer Neg Hx    COPD Neg Hx    Depression Neg Hx    Diabetes Neg Hx    Drug abuse Neg Hx    Early death Neg Hx    Hearing loss Neg Hx    Heart disease Neg Hx    Hyperlipidemia Neg Hx    Hypertension Neg Hx    Kidney disease Neg Hx    Learning disabilities Neg Hx    Mental illness Neg Hx    Mental retardation Neg Hx    Miscarriages / Stillbirths Neg Hx    Stroke Neg Hx    Vision loss Neg Hx       Review of Systems:   Please see the history of present illness.    All other systems reviewed and are negative.     EKGs/Labs/Other Test Reviewed:    EKG:  EKG performed January 2024 that I personally reviewed demonstrates sinus bradycardia.  Prior CV studies:  Monitor 2020: ~7 days of data recorded on Zio monitor. Patient had a min HR of 55 bpm, max HR of 172 bpm, and avg HR of 90 bpm. Predominant underlying rhythm was Sinus Rhythm. No VT, SVT, atrial fibrillation, high degree block, or pauses noted. Isolated atrial and ventricular ectopy was rare (<1%). There were 18 patient triggered events. These were all sinus rhythm, either normal sinus rhythm or sinus tachycardia. No significant arrhythmias noted.   Other studies Reviewed: Review of the additional studies/records demonstrates: Imaging studies available do not show aortic atherosclerosis or coronary artery calcification  Recent Labs: 05/25/2022: TSH 1.910 07/02/2022: Hemoglobin 9.9; Platelets 307   Recent Lipid Panel Lab Results  Component Value Date/Time   CHOL 182 05/05/2021 12:30 PM   TRIG 74 05/05/2021 12:30 PM   HDL 69 05/05/2021 12:30 PM   LDLCALC 99 05/05/2021 12:30 PM    Risk Assessment/Calculations:                Physical Exam:    VS:  BP 118/78   Pulse 65   Ht 5' (1.524 m)   Wt 160 lb 12.8 oz (72.9 kg)   LMP 06/17/2022   SpO2 100%   BMI 31.40 kg/m    Wt Readings from Last 3 Encounters:  07/16/22 160 lb 12.8 oz (72.9 kg)  07/02/22 158 lb (71.7 kg)  05/25/22 162 lb (73.5 kg)    GENERAL:  No apparent distress, AOx3 HEENT:  No carotid bruits, +2 carotid impulses, no scleral icterus CAR: RRR with occasional ectopy no murmurs, gallops, rubs, or thrills RES:  Clear to auscultation bilaterally ABD:  Soft, nontender, nondistended, positive bowel sounds x 4 VASC:  +2 radial pulses, +2 carotid pulses, palpable pedal pulses NEURO:  CN 2-12 grossly intact; motor and sensory grossly intact PSYCH:   No active depression or anxiety EXT:  No edema, ecchymosis, or cyanosis  Signed, Early Osmond, MD  07/16/2022 9:40 AM    [address], [place], [place]  [address] Phone: [phone]; Fax: [phone]   Note:  This document was prepared using Dragon voice  recognition software and may include unintentional dictation errors.

## 2022-07-16 ENCOUNTER — Encounter: Payer: Self-pay | Admitting: *Deleted

## 2022-07-16 ENCOUNTER — Ambulatory Visit: Payer: Medicaid Other | Attending: Internal Medicine | Admitting: Internal Medicine

## 2022-07-16 ENCOUNTER — Encounter: Payer: Self-pay | Admitting: Internal Medicine

## 2022-07-16 VITALS — BP 118/78 | HR 65 | Ht 60.0 in | Wt 160.8 lb

## 2022-07-16 DIAGNOSIS — R2231 Localized swelling, mass and lump, right upper limb: Secondary | ICD-10-CM | POA: Diagnosis not present

## 2022-07-16 DIAGNOSIS — R002 Palpitations: Secondary | ICD-10-CM | POA: Diagnosis not present

## 2022-07-16 LAB — COMPREHENSIVE METABOLIC PANEL
ALT: 8 IU/L (ref 0–32)
AST: 9 IU/L (ref 0–40)
Albumin/Globulin Ratio: 2.3 — ABNORMAL HIGH (ref 1.2–2.2)
Albumin: 4.5 g/dL (ref 3.9–4.9)
Alkaline Phosphatase: 62 IU/L (ref 44–121)
BUN/Creatinine Ratio: 18 (ref 9–23)
BUN: 12 mg/dL (ref 6–20)
Bilirubin Total: 0.3 mg/dL (ref 0.0–1.2)
CO2: 20 mmol/L (ref 20–29)
Calcium: 9.4 mg/dL (ref 8.7–10.2)
Chloride: 103 mmol/L (ref 96–106)
Creatinine, Ser: 0.66 mg/dL (ref 0.57–1.00)
Globulin, Total: 2 g/dL (ref 1.5–4.5)
Glucose: 76 mg/dL (ref 70–99)
Potassium: 4.2 mmol/L (ref 3.5–5.2)
Sodium: 138 mmol/L (ref 134–144)
Total Protein: 6.5 g/dL (ref 6.0–8.5)
eGFR: 116 mL/min/{1.73_m2} (ref >59)

## 2022-07-16 NOTE — Progress Notes (Unsigned)
Patient enrolled for [employer]/ [employer] to ship a 30 day cardiac event monitor to her address on file.

## 2022-07-16 NOTE — Patient Instructions (Signed)
Medication Instructions:  No changes *If you need a refill on your cardiac medications before your next appointment, please call your pharmacy*   Lab Work: Today: cmet If you have labs (blood work) drawn today and your tests are completely normal, you will receive your results only by: [place] (if you have MyChart) OR A paper copy in the mail If you have any lab test that is abnormal or we need to change your treatment, we will call you to review the results.   Testing/Procedures: Your physician has requested that you have an echocardiogram. Echocardiography is a painless test that uses sound waves to create images of your heart. It provides your doctor with information about the size and shape of your heart and how well your heart's chambers and valves are working. This procedure takes approximately one hour. There are no restrictions for this procedure. Please do NOT wear cologne, perfume, aftershave, or lotions (deodorant is allowed). Please arrive 15 minutes prior to your appointment time.  Your physician has recommended that you wear an event monitor. Event monitors are medical devices that record the heart's electrical activity. Doctors most often [place] these monitors to diagnose arrhythmias. Arrhythmias are problems with the speed or rhythm of the heartbeat. The monitor is a small, portable device. You can wear one while you do your normal daily activities. This is usually used to diagnose what is causing palpitations/syncope (passing out).   Follow-Up: As needed  Other Instructions [employer] Cardiac Event Monitor Instructions  Your physician has requested you wear your cardiac event monitor for 30 days. [employer] may call or text to confirm a shipping address. The monitor will be sent to a land address via UPS. [employer] will not ship a monitor to a PO BOX. It typically takes 3-5 days to receive your monitor after it has been enrolled. [employer] will assist with USPS  tracking if your package is delayed. The telephone number for [employer] is [phone]. Once you have received your monitor, please review the enclosed instructions. Instruction tutorials can also be viewed under help and settings on the enclosed cell phone. Your monitor has already been registered assigning a specific monitor serial # to you.  Billing and Self Pay Discount Information  [employer] has been provided the insurance information we had on file for you.  If your insurance has been updated, please call [employer] at [phone] to provide them with your updated insurance information.   [employer] offers a discounted Self Pay option for patients who have insurance that does not cover their cardiac event monitor or patients without insurance.  The discounted cost of a Self Pay Cardiac Event Monitor would be $225.00 , if the patient contacts [employer] at [phone] within 7 days of applying the monitor to make payment arrangements.  If the patient does not contact [employer] within 7 days of applying the monitor, the cost of the cardiac event monitor will be $350.00.  Applying the monitor  Remove cell phone from case and turn it on. The cell phone works as Dealer and needs to be within [employer] of you at all times. The cell phone will need to be charged on a daily basis. We recommend you plug the cell phone into the enclosed charger at your bedside table every night.  Monitor batteries: You will receive two monitor batteries labelled #1 and #2. These are your recorders. Plug battery #2 onto the second connection on the enclosed charger. Keep one battery on the charger at all times. This will  keep the monitor battery deactivated. It will also keep it fully charged for when you need to switch your monitor batteries. A small light will be blinking on the battery emblem when it is charging. The light on the battery emblem will remain on when the battery is fully  charged.  Open package of a Monitor strip. Insert battery #1 into black hood on strip and gently squeeze monitor battery onto connection as indicated in instruction booklet. Set aside while preparing skin.  Choose location for your strip, vertical or horizontal, as indicated in the instruction booklet. Shave to remove all hair from location. There cannot be any lotions, oils, powders, or colognes on skin where monitor is to be applied. Wipe skin clean with enclosed Saline wipe. Dry skin completely.  Peel paper labeled #1 off the back of the Monitor strip exposing the adhesive. Place the monitor on the chest in the vertical or horizontal position shown in the instruction booklet. One arrow on the monitor strip must be pointing upward. Carefully remove paper labeled #2, attaching remainder of strip to your skin. Try not to create any folds or wrinkles in the strip as you apply it.  Firmly press and release the circle in the center of the monitor battery. You will hear a small beep. This is turning the monitor battery on. The heart emblem on the monitor battery will light up every 5 seconds if the monitor battery in turned on and connected to the patient securely. Do not push and hold the circle down as this turns the monitor battery off. The cell phone will locate the monitor battery. A screen will appear on the cell phone checking the connection of your monitor strip. This may read poor connection initially but change to good connection within the next minute. Once your monitor accepts the connection you will hear a series of 3 beeps followed by a climbing crescendo of beeps. A screen will appear on the cell phone showing the two monitor strip placement options. Touch the picture that demonstrates where you applied the monitor strip.  Your monitor strip and battery are waterproof. You are able to shower, bathe, or swim with the monitor on. They just ask you do not submerge deeper than 3 feet  underwater. We recommend removing the monitor if you are swimming in a lake, river, or ocean.  Your monitor battery will need to be switched to a fully charged monitor battery approximately once a week. The cell phone will alert you of an action which needs to be made.  On the cell phone, tap for details to reveal connection status, monitor battery status, and cell phone battery status. The green dots indicates your monitor is in good status. A red dot indicates there is something that needs your attention.  To record a symptom, click the circle on the monitor battery. In 30-60 seconds a list of symptoms will appear on the cell phone. Select your symptom and tap save. Your monitor will record a sustained or significant arrhythmia regardless of you clicking the button. Some patients do not feel the heart rhythm irregularities. [employer] will notify us of any serious or critical events.  Refer to instruction booklet for instructions on switching batteries, changing strips, the Do not disturb or Pause features, or any additional questions.  Call [employer] at [phone], to confirm your monitor is transmitting and record your baseline. They will answer any questions you may have regarding the monitor instructions at that time.  Returning the monitor to [employer]  Place all equipment back into blue box. Peel off strip of paper to expose adhesive and close box securely. There is a prepaid UPS shipping label on this box. Drop in a UPS drop box, or at a UPS facility like [employer]. You may also contact [employer] to arrange UPS to pick up monitor package at your home.

## 2022-07-23 ENCOUNTER — Ambulatory Visit: Payer: Medicaid Other | Attending: Internal Medicine

## 2022-07-23 DIAGNOSIS — R002 Palpitations: Secondary | ICD-10-CM

## 2022-08-05 DIAGNOSIS — N920 Excessive and frequent menstruation with regular cycle: Secondary | ICD-10-CM | POA: Diagnosis not present

## 2022-08-05 DIAGNOSIS — N9412 Deep dyspareunia: Secondary | ICD-10-CM | POA: Diagnosis not present

## 2022-08-05 DIAGNOSIS — R102 Pelvic and perineal pain: Secondary | ICD-10-CM | POA: Diagnosis not present

## 2022-08-05 DIAGNOSIS — M659 Synovitis and tenosynovitis, unspecified: Secondary | ICD-10-CM | POA: Diagnosis not present

## 2022-08-05 DIAGNOSIS — M7989 Other specified soft tissue disorders: Secondary | ICD-10-CM | POA: Diagnosis not present

## 2022-08-05 DIAGNOSIS — Z113 Encounter for screening for infections with a predominantly sexual mode of transmission: Secondary | ICD-10-CM | POA: Diagnosis not present

## 2022-08-05 DIAGNOSIS — M25431 Effusion, right wrist: Secondary | ICD-10-CM | POA: Diagnosis not present

## 2022-08-05 DIAGNOSIS — M25531 Pain in right wrist: Secondary | ICD-10-CM | POA: Diagnosis not present

## 2022-08-11 ENCOUNTER — Other Ambulatory Visit: Payer: Self-pay | Admitting: Obstetrics & Gynecology

## 2022-08-11 ENCOUNTER — Ambulatory Visit (HOSPITAL_COMMUNITY): Payer: Medicaid Other | Attending: Cardiology

## 2022-08-11 DIAGNOSIS — R002 Palpitations: Secondary | ICD-10-CM | POA: Diagnosis not present

## 2022-08-11 DIAGNOSIS — R102 Pelvic and perineal pain: Secondary | ICD-10-CM

## 2022-08-11 LAB — ECHOCARDIOGRAM COMPLETE
Area-P 1/2: 3.86 cm2
S' Lateral: 3.2 cm

## 2022-08-14 DIAGNOSIS — G5601 Carpal tunnel syndrome, right upper limb: Secondary | ICD-10-CM | POA: Diagnosis not present

## 2022-08-26 ENCOUNTER — Ambulatory Visit (INDEPENDENT_AMBULATORY_CARE_PROVIDER_SITE_OTHER): Payer: Medicaid Other | Admitting: Family Medicine

## 2022-08-26 ENCOUNTER — Encounter (INDEPENDENT_AMBULATORY_CARE_PROVIDER_SITE_OTHER): Payer: Self-pay | Admitting: Family Medicine

## 2022-08-26 VITALS — BP 106/74 | HR 61 | Temp 98.2°F | Ht 60.0 in | Wt 156.0 lb

## 2022-08-26 DIAGNOSIS — E559 Vitamin D deficiency, unspecified: Secondary | ICD-10-CM | POA: Diagnosis not present

## 2022-08-26 DIAGNOSIS — Z6830 Body mass index (BMI) 30.0-30.9, adult: Secondary | ICD-10-CM | POA: Diagnosis not present

## 2022-08-26 DIAGNOSIS — E669 Obesity, unspecified: Secondary | ICD-10-CM | POA: Diagnosis not present

## 2022-08-26 DIAGNOSIS — K219 Gastro-esophageal reflux disease without esophagitis: Secondary | ICD-10-CM

## 2022-08-26 DIAGNOSIS — Z9884 Bariatric surgery status: Secondary | ICD-10-CM | POA: Diagnosis not present

## 2022-08-26 DIAGNOSIS — Z0289 Encounter for other administrative examinations: Secondary | ICD-10-CM

## 2022-08-26 NOTE — Assessment & Plan Note (Signed)
Reviewed post op hx Has lost 84 lb in 2.5 years with adequate volume restriction and no post op complications.  She is getting lean protein with meals and denies N/V/ abdominal pain.  Weight has plateaued with a BMI of 30.  Lacking regular exercise.  Consider use of AOMs.  Check labs/ IC next visit.

## 2022-08-26 NOTE — Assessment & Plan Note (Signed)
Reports taking RX vitamin D 50,000 IU once a week. C/o fatigue.  Plan to check vitamin D level next visit with a goal 50-70.

## 2022-08-26 NOTE — Assessment & Plan Note (Signed)
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  No longer needing daily PPI.  Continue to limit portion sizes with meals, avoiding high acid foods and late night eating.  May use OTC famotidine prn.

## 2022-08-26 NOTE — Progress Notes (Signed)
Office: [phone]  /  Fax: [phone]   Initial Visit  Einar Gip was seen in clinic today to evaluate for obesity. She is interested in losing weight to improve overall health and reduce the risk of weight related complications. She presents today to review program treatment options, initial physical assessment, and evaluation.     She was referred by: PCP  When asked what else they would like to accomplish? She states: Improve appearance, Improve self-confidence, and Lose a target amount of weight : 20-30 lb  Weight history: overweight since childhood, maintained thru college and gained weight following pregnancies.  Able to lose 10 kg with dieting in the past.  Peak weight 240 lb in 2019.  Had VSG 2.5 yrs ago in [place]. Previously had gastric balloon.  Had dyspepsia from junk food.  Has good volume restriction.  Getting protein with meals.  At nadir weight and feels stuck x 1 yr.  When asked how has your weight affected you? She states: Other: wants to feel better about herself  Some associated conditions: Vitamin D Deficiency  Contributing factors: Family history and Pregnancy  Weight promoting medications identified: None  Current nutrition plan: Low-carb  Current level of physical activity: NEAT  Current or previous pharmacotherapy: None  Response to medication: Never tried medications   Past medical history includes:   Past Medical History:  Diagnosis Date   Complication of anesthesia    Patient states when given the epidural the numbness went up to her nose.    Diabetes mellitus without complication ([place])    Hepatitis    Hepatitis B carrier ([place])      Objective:   BP 106/74   Pulse 61   Temp 98.2 F (36.8 C)   Ht 5' (1.524 m)   Wt 156 lb (70.8 kg)   SpO2 100%   BMI 30.47 kg/m  She was weighed on the bioimpedance scale: Body mass index is 30.47 kg/m.  Peak Weight:237 lb, Body Fat%:43.7, Visceral Fat Rating:8, Weight trend over the last  12 months: Unchanged  General:  Alert, oriented and cooperative. Patient is in no acute distress.  Respiratory: Normal respiratory effort, no problems with respiration noted   Gait: able to ambulate independently  Mental Status: Normal mood and affect. Normal behavior. Normal judgment and thought content.   DIAGNOSTIC DATA REVIEWED:  BMET    Component Value Date/Time   NA 138 07/16/2022 0943   K 4.2 07/16/2022 0943   CL 103 07/16/2022 0943   CO2 20 07/16/2022 0943   GLUCOSE 76 07/16/2022 0943   GLUCOSE 88 07/06/2016 0946   BUN 12 07/16/2022 0943   CREATININE 0.66 07/16/2022 0943   CREATININE 0.57 07/06/2016 0946   CALCIUM 9.4 07/16/2022 0943   GFRNONAA 122 10/11/2019 1111   GFRAA 141 10/11/2019 1111   Lab Results  Component Value Date   HGBA1C 5.5 05/25/2022   HGBA1C 5.5 12/11/2013   No results found for: "INSULIN" CBC    Component Value Date/Time   WBC 3.4 07/02/2022 0858   WBC 4.2 12/30/2018 0640   RBC 4.81 07/02/2022 0858   RBC 4.67 12/30/2018 0640   HGB 9.9 (L) 07/02/2022 0858   HCT 34.2 07/02/2022 0858   PLT 307 07/02/2022 0858   MCV 71 (L) 07/02/2022 0858   MCH 20.6 (L) 07/02/2022 0858   MCH 19.3 (L) 12/30/2018 0640   MCHC 28.9 (L) 07/02/2022 0858   MCHC 29.0 (L) 12/30/2018 0640   RDW 13.7 07/02/2022 0858  Iron/TIBC/Ferritin/ %Sat    Component Value Date/Time   IRON 28 10/20/2016 0959   TIBC 425 10/20/2016 0959   FERRITIN 10 (L) 10/20/2016 0959   IRONPCTSAT 7 (LL) 10/20/2016 0959   IRONPCTSAT 6 (L) 07/06/2016 0946   Lipid Panel     Component Value Date/Time   CHOL 182 05/05/2021 1230   TRIG 74 05/05/2021 1230   HDL 69 05/05/2021 1230   CHOLHDL 2.6 05/05/2021 1230   LDLCALC 99 05/05/2021 1230   Hepatic Function Panel     Component Value Date/Time   PROT 6.5 07/16/2022 0943   ALBUMIN 4.5 07/16/2022 0943   AST 9 07/16/2022 0943   ALT 8 07/16/2022 0943   ALKPHOS 62 07/16/2022 0943   BILITOT 0.3 07/16/2022 0943   BILIDIR 0.1 07/06/2016 0946    IBILI 0.4 07/06/2016 0946      Component Value Date/Time   TSH 1.910 05/25/2022 1216     Assessment and Plan:   S/P laparoscopic sleeve gastrectomy Assessment & Plan: Reviewed post op hx Has lost 84 lb in 2.5 years with adequate volume restriction and no post op complications.  She is getting lean protein with meals and denies N/V/ abdominal pain.  Weight has plateaued with a BMI of 30.  Lacking regular exercise.  Consider use of AOMs.  Check labs/ IC next visit.   Generalized obesity  BMI 30.0-30.9,adult  Vitamin D deficiency Assessment & Plan: Reports taking RX vitamin D 50,000 IU once a week. C/o fatigue.  Plan to check vitamin D level next visit with a goal 50-70.   Gastroesophageal reflux disease without esophagitis Assessment & Plan: GERD has improved with dietary changes post vertical sleeve gastrectomy.  No longer needing daily PPI.  Continue to limit portion sizes with meals, avoiding high acid foods and late night eating.  May use OTC famotidine prn.         Obesity Treatment / Action Plan:  Patient will work on garnering support from family and friends to begin weight loss journey. Will work on eliminating or reducing the presence of highly palatable, calorie dense foods in the home. Will complete provided nutritional and psychosocial assessment questionnaire before the next appointment. Will be scheduled for indirect calorimetry to determine resting energy expenditure in a fasting state.  This will allow [place] to create a reduced calorie, high-protein meal plan to promote loss of fat mass while preserving muscle mass. Will think about ideas on how to incorporate physical activity into their daily routine. Was counseled on nutritional approaches to weight loss and benefits of complex carbs and high quality protein as part of nutritional weight management. Was counseled on pharmacotherapy and role as an adjunct in weight management.   Obesity Education  Performed Today:  She was weighed on the bioimpedance scale and results were discussed and documented in the synopsis.  We discussed obesity as a disease and the importance of a more detailed evaluation of all the factors contributing to the disease.  We discussed the importance of long term lifestyle changes which include nutrition, exercise and behavioral modifications as well as the importance of customizing this to her specific health and social needs.  We discussed the benefits of reaching a healthier weight to alleviate the symptoms of existing conditions and reduce the risks of the biomechanical, metabolic and psychological effects of obesity.  Mikaela Hago Elimam appears to be in the action stage of change and states they are ready to start intensive lifestyle modifications and behavioral modifications.  30 minutes  was spent today on this visit including the above counseling, pre-visit chart review, and post-visit documentation.  Reviewed by clinician on day of visit: allergies, medications, problem list, medical history, surgical history, family history, social history, and previous encounter notes pertinent to obesity diagnosis.    Loyal Gambler, D.O. DABFM, DABOM [facility] [phone] [place], [place] [address] [phone]

## 2022-09-07 ENCOUNTER — Ambulatory Visit
Admission: RE | Admit: 2022-09-07 | Discharge: 2022-09-07 | Disposition: A | Discharge status: Home / Self Care | Payer: Medicaid Other | Source: Ambulatory Visit | Attending: Obstetrics & Gynecology | Admitting: Obstetrics & Gynecology

## 2022-09-07 DIAGNOSIS — R102 Pelvic and perineal pain: Secondary | ICD-10-CM

## 2022-09-09 DIAGNOSIS — N9412 Deep dyspareunia: Secondary | ICD-10-CM | POA: Diagnosis not present

## 2022-09-09 DIAGNOSIS — R102 Pelvic and perineal pain: Secondary | ICD-10-CM | POA: Diagnosis not present

## 2022-09-09 DIAGNOSIS — N920 Excessive and frequent menstruation with regular cycle: Secondary | ICD-10-CM | POA: Diagnosis not present

## 2022-09-21 DIAGNOSIS — G5601 Carpal tunnel syndrome, right upper limb: Secondary | ICD-10-CM | POA: Diagnosis not present

## 2022-10-07 ENCOUNTER — Encounter (INDEPENDENT_AMBULATORY_CARE_PROVIDER_SITE_OTHER): Payer: Self-pay | Admitting: Family Medicine

## 2022-10-07 ENCOUNTER — Ambulatory Visit (INDEPENDENT_AMBULATORY_CARE_PROVIDER_SITE_OTHER): Payer: Medicaid Other | Admitting: Family Medicine

## 2022-10-07 VITALS — BP 103/70 | HR 60 | Temp 98.2°F | Ht 60.0 in | Wt 154.0 lb

## 2022-10-07 DIAGNOSIS — Z1331 Encounter for screening for depression: Secondary | ICD-10-CM

## 2022-10-07 DIAGNOSIS — R0602 Shortness of breath: Secondary | ICD-10-CM

## 2022-10-07 DIAGNOSIS — E669 Obesity, unspecified: Secondary | ICD-10-CM

## 2022-10-07 DIAGNOSIS — Z6830 Body mass index (BMI) 30.0-30.9, adult: Secondary | ICD-10-CM | POA: Diagnosis not present

## 2022-10-07 DIAGNOSIS — F3289 Other specified depressive episodes: Secondary | ICD-10-CM | POA: Diagnosis not present

## 2022-10-07 DIAGNOSIS — Z9884 Bariatric surgery status: Secondary | ICD-10-CM | POA: Diagnosis not present

## 2022-10-07 DIAGNOSIS — R5383 Other fatigue: Secondary | ICD-10-CM | POA: Diagnosis not present

## 2022-10-07 DIAGNOSIS — K219 Gastro-esophageal reflux disease without esophagitis: Secondary | ICD-10-CM

## 2022-10-07 DIAGNOSIS — F32A Depression, unspecified: Secondary | ICD-10-CM | POA: Insufficient documentation

## 2022-10-07 DIAGNOSIS — E559 Vitamin D deficiency, unspecified: Secondary | ICD-10-CM

## 2022-10-07 MED ORDER — PANTOPRAZOLE SODIUM 40 MG PO TBEC: 40.0000 mg | DELAYED_RELEASE_TABLET | Freq: Every day | ORAL | 0 refills | Status: DC

## 2022-10-07 MED ORDER — BUPROPION HCL ER (XL) 150 MG PO TB24: 150.0000 mg | ORAL_TABLET | Freq: Every day | ORAL | 0 refills | Status: DC

## 2022-10-08 LAB — COMPREHENSIVE METABOLIC PANEL
ALT: 12 IU/L (ref 0–32)
AST: 13 IU/L (ref 0–40)
Albumin/Globulin Ratio: 1.8 (ref 1.2–2.2)
Albumin: 4.7 g/dL (ref 3.9–4.9)
Alkaline Phosphatase: 69 IU/L (ref 44–121)
BUN/Creatinine Ratio: 19 (ref 9–23)
BUN: 13 mg/dL (ref 6–20)
Bilirubin Total: 0.3 mg/dL (ref 0.0–1.2)
CO2: 19 mmol/L — ABNORMAL LOW (ref 20–29)
Calcium: 9.6 mg/dL (ref 8.7–10.2)
Chloride: 100 mmol/L (ref 96–106)
Creatinine, Ser: 0.67 mg/dL (ref 0.57–1.00)
Globulin, Total: 2.6 g/dL (ref 1.5–4.5)
Glucose: 80 mg/dL (ref 70–99)
Potassium: 4.6 mmol/L (ref 3.5–5.2)
Sodium: 137 mmol/L (ref 134–144)
Total Protein: 7.3 g/dL (ref 6.0–8.5)
eGFR: 115 mL/min/{1.73_m2} (ref >59)

## 2022-10-08 LAB — CBC WITH DIFFERENTIAL/PLATELET
Basophils Absolute: 0 10*3/uL (ref 0.0–0.2)
Basos: 1 %
EOS (ABSOLUTE): 0.1 10*3/uL (ref 0.0–0.4)
Eos: 3 %
Hematocrit: 37.2 % (ref 34.0–46.6)
Hemoglobin: 10.9 g/dL — ABNORMAL LOW (ref 11.1–15.9)
Immature Grans (Abs): 0 10*3/uL (ref 0.0–0.1)
Immature Granulocytes: 0 %
Lymphocytes Absolute: 1.9 10*3/uL (ref 0.7–3.1)
Lymphs: 52 %
MCH: 20.6 pg — ABNORMAL LOW (ref 26.6–33.0)
MCHC: 29.3 g/dL — ABNORMAL LOW (ref 31.5–35.7)
MCV: 70 fL — ABNORMAL LOW (ref 79–97)
Monocytes Absolute: 0.3 10*3/uL (ref 0.1–0.9)
Monocytes: 9 %
Neutrophils Absolute: 1.3 10*3/uL — ABNORMAL LOW (ref 1.4–7.0)
Neutrophils: 35 %
Platelets: 302 10*3/uL (ref 150–450)
RBC: 5.3 x10E6/uL — ABNORMAL HIGH (ref 3.77–5.28)
RDW: 14.5 % (ref 11.7–15.4)
WBC: 3.6 10*3/uL (ref 3.4–10.8)

## 2022-10-08 LAB — VITAMIN B12: Vitamin B-12: 590 pg/mL (ref 232–1245)

## 2022-10-08 LAB — INSULIN, RANDOM: INSULIN: 3 u[IU]/mL (ref 2.6–24.9)

## 2022-10-08 LAB — FERRITIN: Ferritin: 14 ng/mL — ABNORMAL LOW (ref 15–150)

## 2022-10-08 LAB — LIPID PANEL
Chol/HDL Ratio: 2.7 ratio (ref 0.0–4.4)
Cholesterol, Total: 181 mg/dL (ref 100–199)
HDL: 67 mg/dL (ref >39)
LDL Chol Calc (NIH): 100 mg/dL — ABNORMAL HIGH (ref 0–99)
Triglycerides: 76 mg/dL (ref 0–149)
VLDL Cholesterol Cal: 14 mg/dL (ref 5–40)

## 2022-10-08 LAB — HEMOGLOBIN A1C
Est. average glucose Bld gHb Est-mCnc: 114 mg/dL
Hgb A1c MFr Bld: 5.6 % (ref 4.8–5.6)

## 2022-10-08 LAB — TSH: TSH: 2.07 u[IU]/mL (ref 0.450–4.500)

## 2022-10-08 LAB — PREALBUMIN: PREALBUMIN: 21 mg/dL (ref 14–35)

## 2022-10-08 LAB — FOLATE: Folate: 7.8 ng/mL (ref >3.0)

## 2022-10-08 LAB — T4, FREE: Free T4: 1.16 ng/dL (ref 0.82–1.77)

## 2022-10-08 LAB — VITAMIN D 25 HYDROXY (VIT D DEFICIENCY, FRACTURES): Vit D, 25-Hydroxy: 33.6 ng/mL (ref 30.0–100.0)

## 2022-10-08 NOTE — Progress Notes (Signed)
Chief Complaint:   OBESITY Morgan [name] (MR# [id number]) is a 37 y.o. female who presents for evaluation and treatment of obesity and related comorbidities. Current BMI is Body mass index is 30.08 kg/m. Morgan [name] has been struggling with her weight for many years and has been unsuccessful in either losing weight, maintaining weight loss, or reaching her healthy weight goal.  Morgan [name] lives with her husband and 3 sons, [facility].  She does exercise about 30 minutes 3 times a week.  She tracks her steps.  She has been over eating since childhood with a long history of an unhealthy diet.  She averages about 8000 steps per day.  She craves sweets, rewards herself with eating, she is also in an emotional eater.  She is at a weight plateau following VSG about 2 years ago.  She has a Photographer.  Morgan [name] is currently in the action stage of change and ready to dedicate time achieving and maintaining a healthier weight. Morgan [name] is interested in becoming our patient and working on intensive lifestyle modifications including (but not limited to) diet and exercise for weight loss.  Morgan [name]'s habits were reviewed today and are as follows: Her family eats meals together, she thinks her family will eat healthier with her, she struggles with family and or coworkers weight loss sabotage, her desired weight loss is 44 lbs, she has been heavy most of her life, she started gaining weight at age 54, her heaviest weight ever was 237 pounds, she is a picky eater and doesn't like to eat healthier foods, she has significant food cravings issues, she skips meals frequently, she frequently makes poor food choices, and she struggles with emotional eating.  Depression Screen Morgan [name]'s Food and Mood (modified PHQ-9) score was 16.  Subjective:   1. Other fatigue Morgan [name] admits to daytime somnolence and admits to waking up still tired. Patient has a history of symptoms of daytime fatigue, morning fatigue, and morning headache. Morgan [name]  generally gets 7 or 8 hours of sleep per night, and states that she has nightime awakenings. Snoring is not present. Apneic episodes are not present. Epworth Sleepiness Score is 11.  Patient denies snoring.  2. SOBOE (shortness of breath on exertion) Briany notes increasing shortness of breath with exercising and seems to be worsening over time with weight gain. She notes getting out of breath sooner with activity than she used to. This has not gotten worse recently. Sadonna denies shortness of breath at rest or orthopnea.  3. Vitamin D deficiency Patient was previously on prescription vitamin D weekly.  Patient has a history of vitamin D deficiency post VSG.  Patient complains of fatigue.  4. S/P laparoscopic sleeve gastrectomy Surgery done in 2022 in [place] with previous intragastric balloon. Max weight 240 lbs,  has a good volume restriction.  She denies vomiting.  Has taken iron, PPI and MVI, daily post op.  5. Gastroesophageal reflux disease, unspecified whether esophagitis present Takes lansoprazole 40 mg OTC daily.  This does help with reflux that worsened after reflux.  Avoids spicy foods, late night eating and cut back on coffee.   6. Other depression with emotional eating Bariatric PHQ9: 16. She tends to snack more with boredom stress.  Worsened with PMS.  (Has IUD).   Assessment/Plan:   1. Other fatigue Morgan [name] does feel that her weight is causing her energy to be lower than it should be. Fatigue may be related to obesity, depression or many other causes. Labs will be ordered,  and in the meanwhile, Morgan [name] will focus on self care including making healthy food choices, increasing physical activity and focusing on stress reduction.  EKG, IC reviewed.  Update labs today.  Increase sleep time to 8 hours per night.  - VITAMIN D 25 Hydroxy (Vit-D Deficiency, Fractures) - TSH - T4, free - Lipid panel - Insulin, random - Hemoglobin A1c - Folate - Comprehensive metabolic panel -  Vitamin B12 - CBC with Differential/Platelet - Prealbumin - Ferritin  2. SOBOE (shortness of breath on exertion) Morgan [name] does feel that she gets out of breath more easily that she used to when she exercises. Morgan [name]'s shortness of breath appears to be obesity related and exercise induced. She has agreed to work on weight loss and gradually increase exercise to treat her exercise induced shortness of breath. Will continue to monitor closely.  3. Vitamin D deficiency Check labs today.  Target level of 50-70.  - VITAMIN D 25 Hydroxy (Vit-D Deficiency, Fractures)  4. S/P laparoscopic sleeve gastrectomy Continue 5 small meals per day eliminating high sugar snacks. Check labs today.   - VITAMIN D 25 Hydroxy (Vit-D Deficiency, Fractures) - TSH - T4, free - Lipid panel - Insulin, random - Hemoglobin A1c - Folate - Comprehensive metabolic panel - Vitamin B12 - CBC with Differential/Platelet - Multiple Vitamin (MULTIVITAMIN) tablet; Take 1 tablet by mouth daily. - Prealbumin - Ferritin  5. Gastroesophageal reflux disease, unspecified whether esophagitis present Discontinue OTC lansoprazole.   Begin- pantoprazole (PROTONIX) 40 MG tablet; Take 1 tablet (40 mg total) by mouth daily.  Dispense: 90 tablet; Refill: 0  6. Other depression with emotional eating Begin - buPROPion (WELLBUTRIN XL) 150 MG 24 hr tablet; Take 1 tablet (150 mg total) by mouth daily.  Dispense: 30 tablet; Refill: 0  7. Depression screen Morgan [name] had a positive depression screening. Depression is commonly associated with obesity and often results in emotional eating behaviors. We will monitor this closely and work on CBT to help improve the non-hunger eating patterns. Referral to Psychology may be required if no improvement is seen as she continues in our clinic.  8. Generalized obesity  9. BMI 30.0-30.9,adult Morgan [name] is currently in the action stage of change and her goal is to continue with weight loss efforts. I recommend  Morgan [name] begin the structured treatment plan as follows:  She has agreed to the Category 2 Plan +100 snack cal.  Can break meals into 2 servings due to early satiety.  Exercise goals: Go to the gym 2 times a week consistently.  Behavioral modification strategies: increasing lean protein intake, increasing vegetables, increasing water intake, increasing high fiber foods, no skipping meals, better snacking choices, emotional eating strategies, planning for success, and decreasing junk food.  She was informed of the importance of frequent follow-up visits to maximize her success with intensive lifestyle modifications for her multiple health conditions. She was informed we would discuss her lab results at her next visit unless there is a critical issue that needs to be addressed sooner. Morgan [name] agreed to keep her next visit at the agreed upon time to discuss these results.  Objective:   Blood pressure 103/70, pulse 60, temperature 98.2 F (36.8 C), height 5' (1.524 m), weight 154 lb (69.9 kg), SpO2 100 %, unknown if currently breastfeeding. Body mass index is 30.08 kg/m.  EKG: Normal sinus rhythm, rate 52 bpm.  Indirect Calorimeter completed today shows a VO2 of 161 and a REE of 1109.  Her calculated basal metabolic rate is 1610 thus her  basal metabolic rate is worse than expected.  General: Cooperative, alert, well developed, in no acute distress. HEENT: Conjunctivae and lids unremarkable. Cardiovascular: Regular rhythm.  Lungs: Normal work of breathing. Neurologic: No focal deficits.   Lab Results  Component Value Date   CREATININE 0.67 10/07/2022   BUN 13 10/07/2022   NA 137 10/07/2022   K 4.6 10/07/2022   CL 100 10/07/2022   CO2 19 (L) 10/07/2022   Lab Results  Component Value Date   ALT 12 10/07/2022   AST 13 10/07/2022   ALKPHOS 69 10/07/2022   BILITOT 0.3 10/07/2022   Lab Results  Component Value Date   HGBA1C 5.6 10/07/2022   HGBA1C 5.5 05/25/2022   HGBA1C 5.1 10/11/2019    HGBA1C 5.4 07/06/2016   HGBA1C 5.5 12/11/2013   Lab Results  Component Value Date   INSULIN 3.0 10/07/2022   Lab Results  Component Value Date   TSH 2.070 10/07/2022   Lab Results  Component Value Date   CHOL 181 10/07/2022   HDL 67 10/07/2022   LDLCALC 100 (H) 10/07/2022   TRIG 76 10/07/2022   CHOLHDL 2.7 10/07/2022   Lab Results  Component Value Date   WBC 3.6 10/07/2022   HGB 10.9 (L) 10/07/2022   HCT 37.2 10/07/2022   MCV 70 (L) 10/07/2022   PLT 302 10/07/2022   Lab Results  Component Value Date   IRON 28 10/20/2016   TIBC 425 10/20/2016   FERRITIN 14 (L) 10/07/2022   Attestation Statements:   Reviewed by clinician on day of visit: allergies, medications, problem list, medical history, surgical history, family history, social history, and previous encounter notes.  I have personally spent 40 minutes total time today in preparation, patient care, nutritional counseling and documentation for this visit, including the following: review of clinical lab tests; review of medical tests/procedures/services.  Marily Lente, am acting as transcriptionist for Seymour Bars, DO. ,, I have reviewed the above documentation for accuracy and completeness, and I agree with the above. Seymour Bars, DO

## 2022-10-21 ENCOUNTER — Ambulatory Visit (INDEPENDENT_AMBULATORY_CARE_PROVIDER_SITE_OTHER): Payer: Medicaid Other | Admitting: Family Medicine

## 2022-11-05 ENCOUNTER — Other Ambulatory Visit: Payer: Self-pay

## 2022-11-05 DIAGNOSIS — N920 Excessive and frequent menstruation with regular cycle: Secondary | ICD-10-CM | POA: Diagnosis not present

## 2022-11-05 DIAGNOSIS — Z0000 Encounter for general adult medical examination without abnormal findings: Secondary | ICD-10-CM | POA: Diagnosis not present

## 2022-11-05 DIAGNOSIS — R102 Pelvic and perineal pain: Secondary | ICD-10-CM | POA: Diagnosis not present

## 2022-11-05 DIAGNOSIS — Z6829 Body mass index (BMI) 29.0-29.9, adult: Secondary | ICD-10-CM | POA: Diagnosis not present

## 2022-11-05 DIAGNOSIS — Z0001 Encounter for general adult medical examination with abnormal findings: Secondary | ICD-10-CM | POA: Diagnosis not present

## 2022-11-05 DIAGNOSIS — F3289 Other specified depressive episodes: Secondary | ICD-10-CM

## 2022-11-05 DIAGNOSIS — Z30431 Encounter for routine checking of intrauterine contraceptive device: Secondary | ICD-10-CM | POA: Diagnosis not present

## 2022-11-05 MED ORDER — BUPROPION HCL ER (XL) 150 MG PO TB24: 150.0000 mg | ORAL_TABLET | Freq: Every day | ORAL | 0 refills | Status: DC

## 2022-11-05 NOTE — Telephone Encounter (Signed)
From: Freddie Breech To: Office of [name], DO Sent: 11/05/2022 4:25 PM EDT Subject: Medication Renewal Request  Refills have been requested for the following medications:   buPROPion (WELLBUTRIN XL) 150 MG 24 hr tablet Clydie Braun E Bowen]  Preferred pharmacy: [id number] - [place], [place] - [address] AT [facility] [address] Delivery method: [employer]

## 2022-11-09 DIAGNOSIS — N920 Excessive and frequent menstruation with regular cycle: Secondary | ICD-10-CM | POA: Diagnosis not present

## 2022-11-18 ENCOUNTER — Ambulatory Visit (INDEPENDENT_AMBULATORY_CARE_PROVIDER_SITE_OTHER): Payer: Medicaid Other | Admitting: Family Medicine

## 2022-11-19 DIAGNOSIS — N920 Excessive and frequent menstruation with regular cycle: Secondary | ICD-10-CM | POA: Diagnosis not present

## 2022-11-19 DIAGNOSIS — Z3040 Encounter for surveillance of contraceptives, unspecified: Secondary | ICD-10-CM | POA: Diagnosis not present

## 2022-11-19 DIAGNOSIS — R102 Pelvic and perineal pain: Secondary | ICD-10-CM | POA: Diagnosis not present

## 2022-11-19 DIAGNOSIS — Z3041 Encounter for surveillance of contraceptive pills: Secondary | ICD-10-CM | POA: Diagnosis not present

## 2022-12-08 ENCOUNTER — Other Ambulatory Visit: Payer: Self-pay | Admitting: Family Medicine

## 2022-12-08 DIAGNOSIS — F3289 Other specified depressive episodes: Secondary | ICD-10-CM

## 2023-01-02 ENCOUNTER — Other Ambulatory Visit (INDEPENDENT_AMBULATORY_CARE_PROVIDER_SITE_OTHER): Payer: Self-pay | Admitting: Family Medicine

## 2023-01-02 DIAGNOSIS — K219 Gastro-esophageal reflux disease without esophagitis: Secondary | ICD-10-CM

## 2023-01-21 ENCOUNTER — Ambulatory Visit (INDEPENDENT_AMBULATORY_CARE_PROVIDER_SITE_OTHER): Payer: Medicaid Other | Admitting: Family Medicine

## 2023-01-21 ENCOUNTER — Encounter (INDEPENDENT_AMBULATORY_CARE_PROVIDER_SITE_OTHER): Payer: Self-pay | Admitting: Family Medicine

## 2023-01-21 VITALS — BP 111/78 | HR 77 | Temp 98.1°F | Ht 60.0 in | Wt 147.0 lb

## 2023-01-21 DIAGNOSIS — D509 Iron deficiency anemia, unspecified: Secondary | ICD-10-CM

## 2023-01-21 DIAGNOSIS — E559 Vitamin D deficiency, unspecified: Secondary | ICD-10-CM

## 2023-01-21 DIAGNOSIS — Z9884 Bariatric surgery status: Secondary | ICD-10-CM

## 2023-01-21 DIAGNOSIS — Z6828 Body mass index (BMI) 28.0-28.9, adult: Secondary | ICD-10-CM

## 2023-01-21 DIAGNOSIS — F3289 Other specified depressive episodes: Secondary | ICD-10-CM

## 2023-01-21 DIAGNOSIS — K219 Gastro-esophageal reflux disease without esophagitis: Secondary | ICD-10-CM

## 2023-01-21 DIAGNOSIS — E669 Obesity, unspecified: Secondary | ICD-10-CM

## 2023-01-21 MED ORDER — BUPROPION HCL ER (XL) 150 MG PO TB24
150.0000 mg | ORAL_TABLET | Freq: Every day | ORAL | 0 refills | Status: DC
Start: 2023-01-21 — End: 2023-02-18

## 2023-01-21 MED ORDER — PANTOPRAZOLE SODIUM 40 MG PO TBEC
40.0000 mg | DELAYED_RELEASE_TABLET | Freq: Every day | ORAL | 0 refills | Status: DC
Start: 2023-01-21 — End: 2023-03-17

## 2023-01-21 MED ORDER — FERROUS GLUCONATE 324 (38 FE) MG PO TABS
324.0000 mg | ORAL_TABLET | Freq: Every day | ORAL | 1 refills | Status: DC
Start: 2023-01-21 — End: 2023-02-18

## 2023-01-21 NOTE — Assessment & Plan Note (Signed)
Last vitamin D Lab Results  Component Value Date   VD25OH 33.6 10/07/2022   Reviewed labs from last visit.  She has not been taking a vitamin D supplement and is at risk for vitamin D deficiency status post vertical sleeve gastrectomy, not on a multivitamin daily.  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  Energy level is fairly low.  She agrees to taking a liquid vitamin D supplement at 4000 IU once daily.  Reviewed brand options online.  Recheck level in 3 to 4 months.

## 2023-01-21 NOTE — Assessment & Plan Note (Signed)
Emotional eating is much better controlled on Wellbutrin XL 150 mg once daily.  She denies adverse side effects.  She has noted breakthrough symptoms of emotional eating when running out of her prescription.  Refilled Wellbutrin XL 150 mg once daily.  Continue working on stress reduction and mindful eating.

## 2023-01-21 NOTE — Assessment & Plan Note (Signed)
She has adequate volume control at mealtime from her vertical sleeve gastrectomy done in 2021 in [place].  Her max weight was 240 pounds and her postop nadir weight is at her current weight of 147 pounds, previously 156 pounds.  She would like to lose about 10-15 more pounds.  She has not been taking a multivitamin daily and does complain of fatigue.  She denies issues with nausea, vomiting, heartburn.  Continue to work on eating 4-5 small meals a day with a focus on lean protein and fiber with meals.  She was unable to tolerate protein shakes but is able to supplement with [place] yogurt and protein bars. Recommended taking a multivitamin daily.

## 2023-01-21 NOTE — Assessment & Plan Note (Signed)
Acid reflux has improved on pantoprazole 40 mg once daily.  She denies epigastric pain.  She has had breakthrough symptoms if she forgets to take her medication.  She denies melena or hematochezia.  She is at risk for ulceration status post vertical sleeve gastrectomy.  Continue pantoprazole 40 mg once daily.

## 2023-01-21 NOTE — Progress Notes (Signed)
Office: [phone]  /  Fax: [phone]  WEIGHT SUMMARY AND BIOMETRICS  Starting Date: 10/07/22  Starting Weight: 154lb   Weight Lost Since Last Visit: 7lb   Vitals Temp: 98.1 F (36.7 C) BP: 111/78 Pulse Rate: 77 SpO2: 100 %   Body Composition  Body Fat %: 43.5 % Fat Mass (lbs): 64 lbs Muscle Mass (lbs): 79 lbs Visceral Fat Rating : 8     HPI  Chief Complaint: OBESITY  Morgan [name] is here to discuss her progress with her obesity treatment plan. She is on the following a lower carbohydrate, vegetable and lean protein rich diet plan and states she is following her eating plan approximately 70 % of the time. She states she is walking 30 minutes 3 times per week.   Interval History:  Since last office visit she is down 7 lb She was lost to follow-up over the past 3 months She did well with Wellbutrin XL 150 mg for emotional eating and did well with this She has been working on tracking steps She is eating most of the foods on the Cat 2 meal plan Feels full at the end of her meals She is s/p VSG done 2022 in [place] with a peak weight of 240lb. Sugar cravings have reduced with eating on a schedule and getting in lean protein and fiber with meals She just got back from [place] She is walking 3 x a week and plans to add in weight training at the gym  Pharmacotherapy: Wellbutrin XL 150 mg daily  PHYSICAL EXAM:  Blood pressure 111/78, pulse 77, temperature 98.1 F (36.7 C), height 5' (1.524 m), weight 147 lb (66.7 kg), SpO2 100%, unknown if currently breastfeeding. Body mass index is 28.71 kg/m.  General: She is overweight, cooperative, alert, well developed, and in no acute distress. PSYCH: Has normal mood, affect and thought process.   Lungs: Normal breathing effort, no conversational dyspnea.  ASSESSMENT AND PLAN  TREATMENT PLAN FOR OBESITY:  Recommended Dietary Goals  Morgan [name] is currently in the action stage of change. As such, her goal is to  continue weight management plan. She has agreed to the Category 2 Plan. -She may increase fruit intake to 2 servings per day and add in 1 high-fiber carbohydrate serving with dinner -Reviewed protein snacks such as drinkable, low sugar yogurts  Behavioral Intervention  We discussed the following Behavioral Modification Strategies today: increasing lean protein intake, decreasing simple carbohydrates , increasing vegetables, increasing lower glycemic fruits, increasing fiber rich foods, increasing water intake, work on meal planning and preparation, avoiding temptations and identifying enticing environmental cues, continue to practice mindfulness when eating, and planning for success.  Additional resources provided today: NA  Recommended Physical Activity Goals  Morgan [name] has been advised to work up to 150 minutes of moderate intensity aerobic activity a week and strengthening exercises 2-3 times per week for cardiovascular health, weight loss maintenance and preservation of muscle mass.   She has agreed to [employer] strengthening exercises with a goal of 2-3 sessions a week  - add in weight training at the gym 30 min 2-3 x a week (has joined the [employer])  Pharmacotherapy changes for the treatment of obesity: None  ASSOCIATED CONDITIONS ADDRESSED TODAY  Iron deficiency anemia, unspecified iron deficiency anemia type Assessment & Plan: Reviewed labs from last visit.  Her hemoglobin was low at 10.9 with a low ferritin of 14.  She has heavy menses and was recently seen by her OB/GYN to discuss management.  She  is also at risk for iron deficiency anemia status post vertical sleeve gastrectomy.  She has not required IV iron infusions.  She does complain of fatigue and tachycardia.  She has some poor exercise tolerance due to iron deficiency anemia.  She has previously had constipation and abdominal pain from ferrous sulfate.  Will begin ferrous gluconate 324 mg once daily.  Plan to recheck iron levels and  CBC in the next 2 to 3 months. If her levels are not improving, will refer to hematology for IV iron infusion.  Orders: -     Ferrous Gluconate; Take 1 tablet (324 mg total) by mouth daily with breakfast.  Dispense: 30 tablet; Refill: 1  Gastroesophageal reflux disease, unspecified whether esophagitis present Assessment & Plan: Acid reflux has improved on pantoprazole 40 mg once daily.  She denies epigastric pain.  She has had breakthrough symptoms if she forgets to take her medication.  She denies melena or hematochezia.  She is at risk for ulceration status post vertical sleeve gastrectomy.  Continue pantoprazole 40 mg once daily.  Orders: -     Pantoprazole Sodium; Take 1 tablet (40 mg total) by mouth daily.  Dispense: 90 tablet; Refill: 0  Other depression with emotional eating Assessment & Plan: Emotional eating is much better controlled on Wellbutrin XL 150 mg once daily.  She denies adverse side effects.  She has noted breakthrough symptoms of emotional eating when running out of her prescription.  Refilled Wellbutrin XL 150 mg once daily.  Continue working on stress reduction and mindful eating.  Orders: -     buPROPion HCl ER (XL); Take 1 tablet (150 mg total) by mouth daily.  Dispense: 30 tablet; Refill: 0  Generalized obesity with starting BMI 30  BMI 28.0-28.9,adult  S/P laparoscopic sleeve gastrectomy Assessment & Plan: She has adequate volume control at mealtime from her vertical sleeve gastrectomy done in 2021 in [place].  Her max weight was 240 pounds and her postop nadir weight is at her current weight of 147 pounds, previously 156 pounds.  She would like to lose about 10-15 more pounds.  She has not been taking a multivitamin daily and does complain of fatigue.  She denies issues with nausea, vomiting, heartburn.  Continue to work on eating 4-5 small meals a day with a focus on lean protein and fiber with meals.  She was unable to tolerate protein shakes but is  able to supplement with [place] yogurt and protein bars. Recommended taking a multivitamin daily.   Vitamin D deficiency Assessment & Plan: Last vitamin D Lab Results  Component Value Date   VD25OH 33.6 10/07/2022   Reviewed labs from last visit.  She has not been taking a vitamin D supplement and is at risk for vitamin D deficiency status post vertical sleeve gastrectomy, not on a multivitamin daily.  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  Energy level is fairly low.  She agrees to taking a liquid vitamin D supplement at 4000 IU once daily.  Reviewed brand options online.  Recheck level in 3 to 4 months.        She was informed of the importance of frequent follow up visits to maximize her success with intensive lifestyle modifications for her multiple health conditions.   ATTESTASTION STATEMENTS:  Reviewed by clinician on day of visit: allergies, medications, problem list, medical history, surgical history, family history, social history, and previous encounter notes pertinent to obesity diagnosis.   I have  personally spent 30 minutes total time today in preparation, patient care, nutritional counseling and documentation for this visit, including the following: review of clinical lab tests; review of medical tests/procedures/services.      [name], DO DABFM, DABOM [facility] [address] [place], [place] [address] [phone]

## 2023-01-21 NOTE — Assessment & Plan Note (Signed)
Reviewed labs from last visit.  Her hemoglobin was low at 10.9 with a low ferritin of 14.  She has heavy menses and was recently seen by her OB/GYN to discuss management.  She is also at risk for iron deficiency anemia status post vertical sleeve gastrectomy.  She has not required IV iron infusions.  She does complain of fatigue and tachycardia.  She has some poor exercise tolerance due to iron deficiency anemia.  She has previously had constipation and abdominal pain from ferrous sulfate.  Will begin ferrous gluconate 324 mg once daily.  Plan to recheck iron levels and CBC in the next 2 to 3 months. If her levels are not improving, will refer to hematology for IV iron infusion.

## 2023-01-27 ENCOUNTER — Telehealth (INDEPENDENT_AMBULATORY_CARE_PROVIDER_SITE_OTHER): Payer: Self-pay | Admitting: Family Medicine

## 2023-01-27 DIAGNOSIS — H5015 Alternating exotropia: Secondary | ICD-10-CM | POA: Diagnosis not present

## 2023-01-27 DIAGNOSIS — H5021 Vertical strabismus, right eye: Secondary | ICD-10-CM | POA: Diagnosis not present

## 2023-01-27 NOTE — Telephone Encounter (Signed)
On 8/7, a call was made to the patient, and a voicemail was left requesting a return call to schedule an appointment with Dr. Cathey Endow.Patient needs 4wk fu

## 2023-02-01 DIAGNOSIS — G5601 Carpal tunnel syndrome, right upper limb: Secondary | ICD-10-CM | POA: Diagnosis not present

## 2023-02-04 DIAGNOSIS — G5601 Carpal tunnel syndrome, right upper limb: Secondary | ICD-10-CM | POA: Diagnosis not present

## 2023-02-16 DIAGNOSIS — G5601 Carpal tunnel syndrome, right upper limb: Secondary | ICD-10-CM | POA: Diagnosis not present

## 2023-02-17 ENCOUNTER — Other Ambulatory Visit (INDEPENDENT_AMBULATORY_CARE_PROVIDER_SITE_OTHER): Payer: Self-pay | Admitting: Family Medicine

## 2023-02-17 DIAGNOSIS — F3289 Other specified depressive episodes: Secondary | ICD-10-CM

## 2023-02-18 ENCOUNTER — Encounter: Payer: Self-pay | Admitting: Family Medicine

## 2023-02-18 ENCOUNTER — Ambulatory Visit: Payer: Medicaid Other | Admitting: Family Medicine

## 2023-02-18 VITALS — BP 112/81 | HR 53 | Temp 98.6°F | Ht 60.0 in | Wt 144.0 lb

## 2023-02-18 DIAGNOSIS — Z9884 Bariatric surgery status: Secondary | ICD-10-CM | POA: Diagnosis not present

## 2023-02-18 DIAGNOSIS — F3289 Other specified depressive episodes: Secondary | ICD-10-CM

## 2023-02-18 DIAGNOSIS — Z6828 Body mass index (BMI) 28.0-28.9, adult: Secondary | ICD-10-CM | POA: Diagnosis not present

## 2023-02-18 DIAGNOSIS — D509 Iron deficiency anemia, unspecified: Secondary | ICD-10-CM | POA: Diagnosis not present

## 2023-02-18 DIAGNOSIS — E559 Vitamin D deficiency, unspecified: Secondary | ICD-10-CM

## 2023-02-18 DIAGNOSIS — E669 Obesity, unspecified: Secondary | ICD-10-CM

## 2023-02-18 DIAGNOSIS — K219 Gastro-esophageal reflux disease without esophagitis: Secondary | ICD-10-CM | POA: Diagnosis not present

## 2023-02-18 MED ORDER — BUPROPION HCL ER (XL) 150 MG PO TB24
150.0000 mg | ORAL_TABLET | Freq: Every day | ORAL | 0 refills | Status: DC
Start: 2023-02-18 — End: 2023-03-17

## 2023-02-18 MED ORDER — FERROUS GLUCONATE 324 (38 FE) MG PO TABS
324.0000 mg | ORAL_TABLET | Freq: Every day | ORAL | 1 refills | Status: DC
Start: 2023-02-18 — End: 2023-03-17

## 2023-02-18 NOTE — Assessment & Plan Note (Signed)
Last vitamin D Lab Results  Component Value Date   VD25OH 33.6 10/07/2022   She started over-the-counter vitamin D 50,000 IU tablet once weekly, [place].  She avoids beef gelatin and could not take the capsule formulation.  Her energy level has started to improve.  She is higher risk for bone loss status post vertical sleeve gastrectomy.  Recheck vitamin D level in the next 3 months.

## 2023-02-18 NOTE — Assessment & Plan Note (Signed)
Her energy level and color have improved now that she is on ferrous gluconate 324 mg once daily with breakfast.  She is not having the GI upset that she previously had on ferrous sulfate.  Plan to recheck iron levels in the next 2 to 3 months.

## 2023-02-18 NOTE — Progress Notes (Signed)
Office: [phone]  /  Fax: [phone]  WEIGHT SUMMARY AND BIOMETRICS  Starting Date: 10/07/22  Starting Weight: 154lb   Weight Lost Since Last Visit: 3lb   Vitals Temp: 98.6 F (37 C) BP: 112/81 Pulse Rate: (!) 53 SpO2: 94 %   Body Composition  Body Fat %: 41.6 % Fat Mass (lbs): 60 lbs Muscle Mass (lbs): 80 lbs Total Body Water (lbs): 66.2 lbs Visceral Fat Rating : 7    HPI  Chief Complaint: OBESITY  Morgan [name] is here to discuss her progress with her obesity treatment plan. She is on the following a lower carbohydrate, vegetable and lean protein rich diet plan and states she is following her eating plan approximately 40 % of the time. She states she is exercising 0 minutes 0 times per week.   Interval History:  Since last office visit she is down 3 lb Muscle mass is up 1 lb and body fat is down 4 lb in the past 4 weeks She has a net weight loss of 10 lb in the past 4 mos of medically supervised weight management She has had eye surgery and carpal tunnel surgery since her last visit She has not been able to exercise due to her surgeries She is mindful of portion sizes but her husband has been preparing her meals Cravings and emotional eating have improved on Wellbutrin She is back on a MVI, vitamin D 50,000 international units  weekly and RX iron and her energy levels have improved She is at her nadir weight now post VSG done in [place] in 2022  Pharmacotherapy: Wellbutrin XL 150 mg qAM  PHYSICAL EXAM:  Blood pressure 112/81, pulse (!) 53, temperature 98.6 F (37 C), height 5' (1.524 m), weight 144 lb (65.3 kg), SpO2 94%, unknown if currently breastfeeding. Body mass index is 28.12 kg/m.  General: She is overweight, cooperative, alert, well developed, and in no acute distress. PSYCH: Has normal mood, affect and thought process.   Lungs: Normal breathing effort, no conversational dyspnea.   ASSESSMENT AND PLAN  TREATMENT PLAN FOR  OBESITY:  Recommended Dietary Goals  Morgan [name] is currently in the action stage of change. As such, her goal is to continue weight management plan. She has agreed to following a lower carbohydrate, vegetable and lean protein rich diet plan.  Behavioral Intervention  We discussed the following Behavioral Modification Strategies today: increasing lean protein intake, decreasing simple carbohydrates , increasing vegetables, increasing lower glycemic fruits, avoiding skipping meals, increasing water intake, work on meal planning and preparation, keeping healthy foods at home, continue to practice mindfulness when eating, and planning for success.  Additional resources provided today: NA  Recommended Physical Activity Goals  Morgan [name] has been advised to work up to 150 minutes of moderate intensity aerobic activity a week and strengthening exercises 2-3 times per week for cardiovascular health, weight loss maintenance and preservation of muscle mass.   She has agreed to Think about ways to increase daily physical activity and overcoming barriers to exercise - plan to start once cleared by surgeons  Pharmacotherapy changes for the treatment of obesity: none  ASSOCIATED CONDITIONS ADDRESSED TODAY  Other depression with emotional eating -     buPROPion HCl ER (XL); Take 1 tablet (150 mg total) by mouth daily.  Dispense: 30 tablet; Refill: 0  Iron deficiency anemia, unspecified iron deficiency anemia type Assessment & Plan: Her energy level and color have improved now that she is on ferrous gluconate 324 mg once daily with breakfast.  She is not having the GI upset that she previously had on ferrous sulfate.  Plan to recheck iron levels in the next 2 to 3 months.  Orders: -     Ferrous Gluconate; Take 1 tablet (324 mg total) by mouth daily with breakfast.  Dispense: 30 tablet; Refill: 1  Generalized obesity with starting BMI 30  BMI 28.0-28.9,adult  S/P laparoscopic sleeve  gastrectomy Assessment & Plan: Preop weight 240 pounds and currently at her postop nadir weight at 144 pounds.  She wishes to lose about 7 more pounds and feels good about her weight.  Her energy level has improved now that she is back on a multivitamin, prescription iron daily, vitamin D once weekly.  She has good restriction with portion sizes at mealtime and has been focused on lean protein intake with meals.  Continue current treatment plan with the target goal of 80 g of dietary protein intake daily.   Gastroesophageal reflux disease, unspecified whether esophagitis present Assessment & Plan: GERD has improved on pantoprazole prescription 40 mg once daily.  She avoids late night eating and high acid foods.  Continue pantoprazole 40 mg once daily.   Vitamin D deficiency Assessment & Plan: Last vitamin D Lab Results  Component Value Date   VD25OH 33.6 10/07/2022   She started over-the-counter vitamin D 50,000 IU tablet once weekly, [place].  She avoids beef gelatin and could not take the capsule formulation.  Her energy level has started to improve.  She is higher risk for bone loss status post vertical sleeve gastrectomy.  Recheck vitamin D level in the next 3 months.          She was informed of the importance of frequent follow up visits to maximize her success with intensive lifestyle modifications for her multiple health conditions.   ATTESTASTION STATEMENTS:  Reviewed by clinician on day of visit: allergies, medications, problem list, medical history, surgical history, family history, social history, and previous encounter notes pertinent to obesity diagnosis.   I have personally spent 30 minutes total time today in preparation, patient care, nutritional counseling and documentation for this visit, including the following: review of clinical lab tests; review of medical tests/procedures/services.      [name], DO DABFM, DABOM [facility] [address] [place], [place] [address] [phone]

## 2023-02-18 NOTE — Assessment & Plan Note (Signed)
Preop weight 240 pounds and currently at her postop nadir weight at 144 pounds.  She wishes to lose about 7 more pounds and feels good about her weight.  Her energy level has improved now that she is back on a multivitamin, prescription iron daily, vitamin D once weekly.  She has good restriction with portion sizes at mealtime and has been focused on lean protein intake with meals.  Continue current treatment plan with the target goal of 80 g of dietary protein intake daily.

## 2023-02-18 NOTE — Assessment & Plan Note (Signed)
GERD has improved on pantoprazole prescription 40 mg once daily.  She avoids late night eating and high acid foods.  Continue pantoprazole 40 mg once daily.

## 2023-02-23 DIAGNOSIS — G5601 Carpal tunnel syndrome, right upper limb: Secondary | ICD-10-CM | POA: Diagnosis not present

## 2023-03-17 ENCOUNTER — Ambulatory Visit: Payer: Medicaid Other | Admitting: Family Medicine

## 2023-03-17 ENCOUNTER — Encounter: Payer: Self-pay | Admitting: Family Medicine

## 2023-03-17 VITALS — BP 96/68 | HR 65 | Temp 98.1°F | Ht 60.0 in | Wt 144.0 lb

## 2023-03-17 DIAGNOSIS — Z9884 Bariatric surgery status: Secondary | ICD-10-CM

## 2023-03-17 DIAGNOSIS — Z6828 Body mass index (BMI) 28.0-28.9, adult: Secondary | ICD-10-CM | POA: Diagnosis not present

## 2023-03-17 DIAGNOSIS — F5089 Other specified eating disorder: Secondary | ICD-10-CM | POA: Diagnosis not present

## 2023-03-17 DIAGNOSIS — D509 Iron deficiency anemia, unspecified: Secondary | ICD-10-CM

## 2023-03-17 DIAGNOSIS — K219 Gastro-esophageal reflux disease without esophagitis: Secondary | ICD-10-CM | POA: Diagnosis not present

## 2023-03-17 DIAGNOSIS — E669 Obesity, unspecified: Secondary | ICD-10-CM | POA: Diagnosis not present

## 2023-03-17 DIAGNOSIS — F3289 Other specified depressive episodes: Secondary | ICD-10-CM | POA: Diagnosis not present

## 2023-03-17 MED ORDER — PANTOPRAZOLE SODIUM 40 MG PO TBEC
40.0000 mg | DELAYED_RELEASE_TABLET | Freq: Every day | ORAL | 0 refills | Status: DC
Start: 2023-03-17 — End: 2023-04-19

## 2023-03-17 MED ORDER — FERROUS GLUCONATE 324 (38 FE) MG PO TABS
324.0000 mg | ORAL_TABLET | Freq: Every day | ORAL | 1 refills | Status: DC
Start: 2023-03-17 — End: 2023-04-19

## 2023-03-17 MED ORDER — BUPROPION HCL ER (XL) 300 MG PO TB24
300.0000 mg | ORAL_TABLET | Freq: Every day | ORAL | 0 refills | Status: DC
Start: 2023-03-17 — End: 2023-04-19

## 2023-03-17 NOTE — Progress Notes (Signed)
Office: [phone]  /  Fax: [phone]  WEIGHT SUMMARY AND BIOMETRICS  Starting Date: 10/07/22  Starting Weight: 154lb   Weight Lost Since Last Visit: 0lb   Vitals Temp: 98.1 F (36.7 C) BP: 96/68 Pulse Rate: 65 SpO2: 100 %   Body Composition  Body Fat %: 42.5 % Fat Mass (lbs): 61.6 lbs Muscle Mass (lbs): 79 lbs Visceral Fat Rating : 8   HPI  Chief Complaint: OBESITY  Morgan [name] is here to discuss her progress with her obesity treatment plan. She is on the following a lower carbohydrate, vegetable and lean protein rich diet plan and states she is following her eating plan approximately 50 % of the time. She states she is exercising 30-60 minutes 3 times per week.   Interval History:  Since last office visit she is down 0 lb She has been having more period related food cravings Feels less improvements in emotional food cravings on Wellbutrin XL 150 mg daily She is working on meal planning and eating on a schedule She has lost 10 lb in the past 5 mos She is at her nadir weight s/p VSG done 2022 in [place]   Pharmacotherapy: Wellbutrin XL 150 mg once daily  PHYSICAL EXAM:  Blood pressure 96/68, pulse 65, temperature 98.1 F (36.7 C), height 5' (1.524 m), weight 144 lb (65.3 kg), SpO2 100%, unknown if currently breastfeeding. Body mass index is 28.12 kg/m.  General: She is overweight, cooperative, alert, well developed, and in no acute distress. PSYCH: Has normal mood, affect and thought process.   Lungs: Normal breathing effort, no conversational dyspnea.   ASSESSMENT AND PLAN  TREATMENT PLAN FOR OBESITY:  Recommended Dietary Goals  Morgan [name] is currently in the action stage of change. As such, her goal is to continue weight management plan. She has agreed to following a lower carbohydrate, vegetable and lean protein rich diet plan.  Behavioral Intervention  We discussed the following Behavioral Modification Strategies today: increasing lean protein  intake, decreasing simple carbohydrates , increasing vegetables, increasing lower glycemic fruits, avoiding skipping meals, increasing water intake, work on meal planning and preparation, keeping healthy foods at home, continue to practice mindfulness when eating, and planning for success.  Additional resources provided today: NA  Recommended Physical Activity Goals  Morgan [name] has been advised to work up to 150 minutes of moderate intensity aerobic activity a week and strengthening exercises 2-3 times per week for cardiovascular health, weight loss maintenance and preservation of muscle mass.   She has agreed to [employer] strengthening exercises with a goal of 2-3 sessions a week  and Start aerobic activity with a goal of 150 minutes a week at moderate intensity.   Pharmacotherapy changes for the treatment of obesity: increase Wellbutrin XL to 300 mg once daily  ASSOCIATED CONDITIONS ADDRESSED TODAY  Iron deficiency anemia, unspecified iron deficiency anemia type Assessment & Plan: Taking ferrous gluconate 324 mg daily without GI upset Energy level is improving IDA related to previous VSG  Continue RX iron daily Repeat level next visit  Orders: -     Ferrous Gluconate; Take 1 tablet (324 mg total) by mouth daily with breakfast.  Dispense: 30 tablet; Refill: 1  Other depression with emotional eating Assessment & Plan: Emotional eating has improved on Wellbutrin XL 150 mg daily without adverse SE but lately, she is having more menstrual related food cravings and has been indulging more.  Increase Wellbutrin XL to 300 mg once daily Continue to use workouts to aid in stress reduction  Orders: -     buPROPion HCl ER (XL); Take 1 tablet (300 mg total) by mouth daily.  Dispense: 30 tablet; Refill: 0  Gastroesophageal reflux disease, unspecified whether esophagitis present -     Pantoprazole Sodium; Take 1 tablet (40 mg total) by mouth daily.  Dispense: 90 tablet; Refill: 0  Generalized  obesity with starting BMI 30  BMI 28.0-28.9,adult  S/P laparoscopic sleeve gastrectomy Assessment & Plan: She is at her nadir weight and within 8 lb of her target weight.  She feels happy about her weight and has improved her control over food intake.  She has adequate restriction from her VSG, working on getting in more lean protein and fiber with meals while reducing excess snacks.  She has also added in a MVI daily.  Continue current supplements Aim for 80 g of protein daily       She was informed of the importance of frequent follow up visits to maximize her success with intensive lifestyle modifications for her multiple health conditions.   ATTESTASTION STATEMENTS:  Reviewed by clinician on day of visit: allergies, medications, problem list, medical history, surgical history, family history, social history, and previous encounter notes pertinent to obesity diagnosis.   I have personally spent 30 minutes total time today in preparation, patient care, nutritional counseling and documentation for this visit, including the following: review of clinical lab tests; review of medical tests/procedures/services.      Morgan Brink, DO DABFM, DABOM [facility] [address] [place], [place] [address] [phone]

## 2023-03-17 NOTE — Assessment & Plan Note (Signed)
She is at her nadir weight and within 8 lb of her target weight.  She feels happy about her weight and has improved her control over food intake.  She has adequate restriction from her VSG, working on getting in more lean protein and fiber with meals while reducing excess snacks.  She has also added in a MVI daily.  Continue current supplements Aim for 80 g of protein daily

## 2023-03-17 NOTE — Assessment & Plan Note (Signed)
Emotional eating has improved on Wellbutrin XL 150 mg daily without adverse SE but lately, she is having more menstrual related food cravings and has been indulging more.  Increase Wellbutrin XL to 300 mg once daily Continue to use workouts to aid in stress reduction

## 2023-03-17 NOTE — Assessment & Plan Note (Signed)
Taking ferrous gluconate 324 mg daily without GI upset Energy level is improving IDA related to previous VSG  Continue RX iron daily Repeat level next visit

## 2023-03-18 DIAGNOSIS — G5601 Carpal tunnel syndrome, right upper limb: Secondary | ICD-10-CM | POA: Diagnosis not present

## 2023-03-22 ENCOUNTER — Other Ambulatory Visit: Payer: Self-pay | Admitting: Family Medicine

## 2023-03-22 DIAGNOSIS — F3289 Other specified depressive episodes: Secondary | ICD-10-CM

## 2023-03-30 DIAGNOSIS — M79641 Pain in right hand: Secondary | ICD-10-CM | POA: Diagnosis not present

## 2023-03-30 DIAGNOSIS — M2560 Stiffness of unspecified joint, not elsewhere classified: Secondary | ICD-10-CM | POA: Diagnosis not present

## 2023-03-30 DIAGNOSIS — M25631 Stiffness of right wrist, not elsewhere classified: Secondary | ICD-10-CM | POA: Diagnosis not present

## 2023-03-30 DIAGNOSIS — R531 Weakness: Secondary | ICD-10-CM | POA: Diagnosis not present

## 2023-03-30 DIAGNOSIS — G5601 Carpal tunnel syndrome, right upper limb: Secondary | ICD-10-CM | POA: Diagnosis not present

## 2023-04-13 DIAGNOSIS — G5601 Carpal tunnel syndrome, right upper limb: Secondary | ICD-10-CM | POA: Diagnosis not present

## 2023-04-14 ENCOUNTER — Other Ambulatory Visit: Payer: Self-pay | Admitting: Family Medicine

## 2023-04-14 DIAGNOSIS — F3289 Other specified depressive episodes: Secondary | ICD-10-CM

## 2023-04-19 ENCOUNTER — Ambulatory Visit: Payer: Medicaid Other | Admitting: Family Medicine

## 2023-04-19 ENCOUNTER — Encounter: Payer: Self-pay | Admitting: Family Medicine

## 2023-04-19 VITALS — BP 109/72 | HR 65 | Temp 98.4°F | Ht 60.0 in | Wt 143.0 lb

## 2023-04-19 DIAGNOSIS — K219 Gastro-esophageal reflux disease without esophagitis: Secondary | ICD-10-CM | POA: Diagnosis not present

## 2023-04-19 DIAGNOSIS — D509 Iron deficiency anemia, unspecified: Secondary | ICD-10-CM

## 2023-04-19 DIAGNOSIS — K909 Intestinal malabsorption, unspecified: Secondary | ICD-10-CM

## 2023-04-19 DIAGNOSIS — F3289 Other specified depressive episodes: Secondary | ICD-10-CM

## 2023-04-19 DIAGNOSIS — E669 Obesity, unspecified: Secondary | ICD-10-CM | POA: Diagnosis not present

## 2023-04-19 DIAGNOSIS — Z6827 Body mass index (BMI) 27.0-27.9, adult: Secondary | ICD-10-CM | POA: Diagnosis not present

## 2023-04-19 DIAGNOSIS — R002 Palpitations: Secondary | ICD-10-CM | POA: Diagnosis not present

## 2023-04-19 MED ORDER — PANTOPRAZOLE SODIUM 40 MG PO TBEC
40.0000 mg | DELAYED_RELEASE_TABLET | Freq: Every day | ORAL | 0 refills | Status: DC
Start: 2023-04-19 — End: 2023-05-25

## 2023-04-19 MED ORDER — FERROUS GLUCONATE 324 (38 FE) MG PO TABS: 324.0000 mg | ORAL_TABLET | Freq: Every day | ORAL | 1 refills | Status: DC

## 2023-04-19 MED ORDER — BUPROPION HCL ER (XL) 300 MG PO TB24
300.0000 mg | ORAL_TABLET | Freq: Every day | ORAL | 0 refills | Status: DC
Start: 2023-04-19 — End: 2023-05-25

## 2023-04-19 NOTE — Assessment & Plan Note (Signed)
GERD has worsened despite use of pantoprazole 40 mg once daily.  She reports sometimes taking her pantoprazole twice a day.  She has limited late-night eating and high acid foods.  She denies melena or hematochezia.  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  She has been taking occasional diclofenac and naproxen for pain and this has only worsened her symptoms.  She reports it has been over 3 years since her last EGD.  Will place a referral to GI for endoscopy.  Avoid use of NSAIDs.  Continue pantoprazole once daily

## 2023-04-19 NOTE — Assessment & Plan Note (Signed)
Wellbutrin 150 mg XL has been helping with emotional eating.  Consider discontinuing if heart palpitations and anxiety are worsening.  Work on mindful eating and stress reduction.

## 2023-04-19 NOTE — Assessment & Plan Note (Signed)
She is doing well on ferrous gluconate 324 mg once daily.  She denies constipation or abdominal pain.  Her energy levels have been worse over the past month and her sleep has been poor.  She denies melena or hematochezia.  Continue ferrous gluconate 324 mg daily and obtain labs, ordered today.

## 2023-04-19 NOTE — Assessment & Plan Note (Signed)
New.  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  She denies excess caffeine but she hasn't been sleeping well and feels stressed out.    Differential diagnosis includes dumping syndrome, hormone changes due to perimenopause, stress and anxiety.  Will update labs including electrolytes, thyroid function, CBC and iron levels.

## 2023-04-19 NOTE — Progress Notes (Signed)
Office: [phone]  /  Fax: [phone]  WEIGHT SUMMARY AND BIOMETRICS  Starting Date: 10/07/22  Starting Weight: 154 lb   Weight Lost Since Last Visit: 1 lb   Vitals Temp: 98.4 F (36.9 C) BP: 109/72 Pulse Rate: 65 SpO2: 100 %   Body Composition  Body Fat %: 42.4 % Fat Mass (lbs): 60.8 lbs Muscle Mass (lbs): 78.6 lbs Visceral Fat Rating : 7    HPI  Chief Complaint: OBESITY  Morgan [name] is here to discuss her progress with her obesity treatment plan. She is on the the Category 2 Plan and states she is following her eating plan approximately 80 % of the time. She states she is exercising 0 minutes 0 times per week.   Interval History:  Since last office visit she is down 1 lb This gives her a net weight loss of 11 lb in the past 5 mos of medical weight management Energy level improving on RX iron supplement S/p VSG 2022 with worsening GERD even on daily PPI Emotional eating improving on Wellbutrin XL 150 mg daily Has not been exercising She has been uncomfortable with people saying that her face looks older since she has lost weight  Pharmacotherapy: Wellbutrin XL 150 mg daily  PHYSICAL EXAM:  Blood pressure 109/72, pulse 65, temperature 98.4 F (36.9 C), height 5' (1.524 m), weight 143 lb (64.9 kg), SpO2 100%, unknown if currently breastfeeding. Body mass index is 27.93 kg/m.  General: She is overweight, cooperative, alert, well developed, and in no acute distress. PSYCH: Has normal mood, affect and thought process.   Lungs: Normal breathing effort, no conversational dyspnea.   ASSESSMENT AND PLAN  TREATMENT PLAN FOR OBESITY:  Recommended Dietary Goals  Morgan [name] is currently in the action stage of change. As such, her goal is to continue weight management plan. She has agreed to the Category 2 Plan.  Behavioral Intervention  We discussed the following Behavioral Modification Strategies today: increasing lean protein intake to established goals,  increasing fiber rich foods, increasing water intake , keeping healthy foods at home, work on managing stress, creating time for self-care and relaxation, planning for success, and continue to work on maintaining a reduced calorie state, getting the recommended amount of protein, incorporating whole foods, making healthy choices, staying well hydrated and practicing mindfulness when eating..  Additional resources provided today: NA  Recommended Physical Activity Goals  Morgan [name] has been advised to work up to 150 minutes of moderate intensity aerobic activity a week and strengthening exercises 2-3 times per week for cardiovascular health, weight loss maintenance and preservation of muscle mass.   She has agreed to Think about enjoyable ways to increase daily physical activity and overcoming barriers to exercise and Increase physical activity in their day and reduce sedentary time (increase NEAT).  Pharmacotherapy changes for the treatment of obesity:   ASSOCIATED CONDITIONS ADDRESSED TODAY  Heart palpitations Assessment & Plan: New.  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  She denies excess caffeine but she hasn't been sleeping well and feels stressed out.    Differential diagnosis includes dumping syndrome, hormone changes due to perimenopause, stress and anxiety.  Will update labs including electrolytes, thyroid function, CBC and iron levels.  Orders: -     Ferritin -     Iron and TIBC -     TSH Rfx on Abnormal to Free T4 -     CBC -  Comprehensive metabolic panel  Other depression with emotional eating Assessment & Plan: Wellbutrin 150 mg XL has been helping with emotional eating.  Consider discontinuing if heart palpitations and anxiety are worsening.  Work on mindful eating and stress reduction.  Orders: -     buPROPion HCl ER (XL); Take 1 tablet (300 mg total) by mouth daily.   Dispense: 30 tablet; Refill: 0  Iron deficiency anemia, unspecified iron deficiency anemia type Assessment & Plan: She is doing well on ferrous gluconate 324 mg once daily.  She denies constipation or abdominal pain.  Her energy levels have been worse over the past month and her sleep has been poor.  She denies melena or hematochezia.  Continue ferrous gluconate 324 mg daily and obtain labs, ordered today.  Orders: -     Ferrous Gluconate; Take 1 tablet (324 mg total) by mouth daily with breakfast.  Dispense: 30 tablet; Refill: 1 -     Ambulatory referral to Gastroenterology  Gastroesophageal reflux disease, unspecified whether esophagitis present Assessment & Plan: GERD has worsened despite use of pantoprazole 40 mg once daily.  She reports sometimes taking her pantoprazole twice a day.  She has limited late-night eating and high acid foods.  She denies melena or hematochezia.  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  She has been taking occasional diclofenac and naproxen for pain and this has only worsened her symptoms.  She reports it has been over 3 years since her last EGD.  Will place a referral to GI for endoscopy.  Avoid use of NSAIDs.  Continue pantoprazole once daily  Orders: -     Pantoprazole Sodium; Take 1 tablet (40 mg total) by mouth daily.  Dispense: 90 tablet; Refill: 0 -     Ambulatory referral to Gastroenterology  Intestinal malabsorption, unspecified type -     Ferritin -     Iron and TIBC -     CBC -     Vitamin B12      She was informed of the importance of frequent follow up visits to maximize her success with intensive lifestyle modifications for her multiple health conditions.   ATTESTASTION STATEMENTS:  Reviewed by clinician on day of visit: allergies, medications, problem list, medical history, surgical history, family history, social history, and previous encounter  notes pertinent to obesity diagnosis.   I have personally spent 30 minutes total time today in preparation, patient care, nutritional counseling and documentation for this visit, including the following: review of clinical lab tests; review of medical tests/procedures/services.      [name], DO DABFM, DABOM [facility] [address] [place], [place] [address] [phone]

## 2023-04-21 DIAGNOSIS — R002 Palpitations: Secondary | ICD-10-CM | POA: Diagnosis not present

## 2023-04-21 DIAGNOSIS — M25631 Stiffness of right wrist, not elsewhere classified: Secondary | ICD-10-CM | POA: Diagnosis not present

## 2023-04-21 DIAGNOSIS — K909 Intestinal malabsorption, unspecified: Secondary | ICD-10-CM | POA: Diagnosis not present

## 2023-04-21 DIAGNOSIS — R531 Weakness: Secondary | ICD-10-CM | POA: Diagnosis not present

## 2023-04-21 DIAGNOSIS — G5601 Carpal tunnel syndrome, right upper limb: Secondary | ICD-10-CM | POA: Diagnosis not present

## 2023-04-21 DIAGNOSIS — M2560 Stiffness of unspecified joint, not elsewhere classified: Secondary | ICD-10-CM | POA: Diagnosis not present

## 2023-04-21 DIAGNOSIS — M79641 Pain in right hand: Secondary | ICD-10-CM | POA: Diagnosis not present

## 2023-04-21 DIAGNOSIS — D509 Iron deficiency anemia, unspecified: Secondary | ICD-10-CM | POA: Diagnosis not present

## 2023-04-22 ENCOUNTER — Telehealth: Payer: Self-pay | Admitting: *Deleted

## 2023-04-22 LAB — COMPREHENSIVE METABOLIC PANEL
ALT: 11 [IU]/L (ref 0–32)
AST: 10 [IU]/L (ref 0–40)
Albumin: 4.3 g/dL (ref 3.9–4.9)
Alkaline Phosphatase: 61 [IU]/L (ref 44–121)
BUN/Creatinine Ratio: 15 (ref 9–23)
BUN: 9 mg/dL (ref 6–20)
Bilirubin Total: 0.3 mg/dL (ref 0.0–1.2)
CO2: 23 mmol/L (ref 20–29)
Calcium: 9.5 mg/dL (ref 8.7–10.2)
Chloride: 103 mmol/L (ref 96–106)
Creatinine, Ser: 0.61 mg/dL (ref 0.57–1.00)
Globulin, Total: 2.2 g/dL (ref 1.5–4.5)
Glucose: 73 mg/dL (ref 70–99)
Potassium: 4.5 mmol/L (ref 3.5–5.2)
Sodium: 140 mmol/L (ref 134–144)
Total Protein: 6.5 g/dL (ref 6.0–8.5)
eGFR: 117 mL/min/{1.73_m2} (ref >59)

## 2023-04-22 LAB — CBC
Hematocrit: 36.7 % (ref 34.0–46.6)
Hemoglobin: 10.8 g/dL — ABNORMAL LOW (ref 11.1–15.9)
MCH: 21.8 pg — ABNORMAL LOW (ref 26.6–33.0)
MCHC: 29.4 g/dL — ABNORMAL LOW (ref 31.5–35.7)
MCV: 74 fL — ABNORMAL LOW (ref 79–97)
Platelets: 227 10*3/uL (ref 150–450)
RBC: 4.95 x10E6/uL (ref 3.77–5.28)
RDW: 14.5 % (ref 11.7–15.4)
WBC: 2.9 10*3/uL — ABNORMAL LOW (ref 3.4–10.8)

## 2023-04-22 LAB — TSH RFX ON ABNORMAL TO FREE T4: TSH: 2.45 u[IU]/mL (ref 0.450–4.500)

## 2023-04-22 LAB — VITAMIN B12: Vitamin B-12: 498 pg/mL (ref 232–1245)

## 2023-04-22 LAB — IRON AND TIBC
Iron Saturation: 7 % — CL (ref 15–55)
Iron: 24 ug/dL — ABNORMAL LOW (ref 27–159)
Total Iron Binding Capacity: 366 ug/dL (ref 250–450)
UIBC: 342 ug/dL (ref 131–425)

## 2023-04-22 LAB — FERRITIN: Ferritin: 16 ng/mL (ref 15–150)

## 2023-04-22 NOTE — Addendum Note (Signed)
Addended by: [name] on: 04/22/2023 08:08 AM   Modules accepted: Orders

## 2023-04-22 NOTE — Telephone Encounter (Signed)
-----   Message from [name] sent at 04/22/2023  8:07 AM EDT ----- Your labs show a LOW iron level.  You do not seem to be absorbing the RX iron supplement, so I am going to put in a referral to hematology.  Blood counts are mildly low - both white and red blood cells.

## 2023-04-28 ENCOUNTER — Ambulatory Visit: Payer: Medicaid Other | Admitting: Student

## 2023-04-28 DIAGNOSIS — G5601 Carpal tunnel syndrome, right upper limb: Secondary | ICD-10-CM | POA: Diagnosis not present

## 2023-04-28 DIAGNOSIS — M2560 Stiffness of unspecified joint, not elsewhere classified: Secondary | ICD-10-CM | POA: Diagnosis not present

## 2023-04-28 DIAGNOSIS — M25631 Stiffness of right wrist, not elsewhere classified: Secondary | ICD-10-CM | POA: Diagnosis not present

## 2023-04-28 DIAGNOSIS — R531 Weakness: Secondary | ICD-10-CM | POA: Diagnosis not present

## 2023-04-28 DIAGNOSIS — M79641 Pain in right hand: Secondary | ICD-10-CM | POA: Diagnosis not present

## 2023-04-29 ENCOUNTER — Encounter: Payer: Self-pay | Admitting: Student

## 2023-04-29 ENCOUNTER — Ambulatory Visit
Admission: RE | Admit: 2023-04-29 | Discharge: 2023-04-29 | Disposition: A | Discharge status: Home / Self Care | Payer: Medicaid Other | Source: Ambulatory Visit | Attending: Family Medicine | Admitting: Family Medicine

## 2023-04-29 ENCOUNTER — Ambulatory Visit: Payer: Medicaid Other | Admitting: Student

## 2023-04-29 VITALS — BP 112/83 | HR 72 | Ht 60.0 in | Wt 143.2 lb

## 2023-04-29 DIAGNOSIS — Z23 Encounter for immunization: Secondary | ICD-10-CM | POA: Diagnosis not present

## 2023-04-29 DIAGNOSIS — M25552 Pain in left hip: Secondary | ICD-10-CM | POA: Diagnosis not present

## 2023-04-29 NOTE — Progress Notes (Signed)
    SUBJECTIVE:   CHIEF COMPLAINT / HPI:   Left hip pain Patient presenting with left hip pain for 3 months.  Acutely worsening over the past week, to the point where she has to sleep on her right side.  Pain is primarily on the lateral aspect of hip, right over the greater trochanter and will sometimes extend down lateral side of femur to lateral malleolus.  Denies trauma or prior injury.  No previous hip pathology.  Has never had surgeries of her hips.  PERTINENT  PMH / PSH: GERD, carpal tunnel syndrome, vitamin D deficiency  OBJECTIVE:   BP 112/83   Pulse 72   Ht 5' (1.524 m)   Wt 143 lb 3.2 oz (65 kg)   LMP 04/04/2023   SpO2 100%   BMI 27.97 kg/m    General: NAD, pleasant Skin: Warm and dry Left hip: No gross deformity, no ecchymosis, no swelling.  TTP over greater trochanteric, mild TTP in proximal IT band.  5/5 strength in flexion and extension, 4/5 strength with hip abduction.  Normal sensation. FADIR positive for slight pain in anterior hip FABER negative Ober's negative  ASSESSMENT/PLAN:   Assessment & Plan Left hip pain Weak hip abductors, given exam suspect greater trochanter bursitis and IT band pathology.  Low suspicion for intra-articular pathology with normal range of motion and minimal pain.  Given duration of symptoms and patient's concern, will obtain bilateral x-rays.  Patient declines injection today.  Consider injection at next visit if x-rays are unremarkable. - Referral to physical therapy placed - OTC analgesics - Follow-up x-ray images Encounter for immunization Flu shot today   Tiffany Kocher, DO [facility]

## 2023-04-29 NOTE — Patient Instructions (Signed)
It was great to see you! Thank you for allowing me to participate in your care!   I recommend that you always bring your medications to each appointment as this makes it easy to ensure we are on the correct medications and helps [place] not miss when refills are needed.  Our plans for today:  - I have sent referral to Physical therapy - Follow-up in 6 weeks  An x-ray was ordered for you---you do not need an appointment to have this completed.  I recommend going to [facility] [address] [place] If the results are normal,I will send you a letter I will call you with results if anything is abnormal     Take care and seek immediate care sooner if you develop any concerns. Please remember to show up 15 minutes before your scheduled appointment time!  Tiffany Kocher, DO [facility]

## 2023-05-05 DIAGNOSIS — G5601 Carpal tunnel syndrome, right upper limb: Secondary | ICD-10-CM | POA: Diagnosis not present

## 2023-05-05 DIAGNOSIS — R531 Weakness: Secondary | ICD-10-CM | POA: Diagnosis not present

## 2023-05-05 DIAGNOSIS — M25631 Stiffness of right wrist, not elsewhere classified: Secondary | ICD-10-CM | POA: Diagnosis not present

## 2023-05-05 DIAGNOSIS — M79641 Pain in right hand: Secondary | ICD-10-CM | POA: Diagnosis not present

## 2023-05-05 DIAGNOSIS — M2560 Stiffness of unspecified joint, not elsewhere classified: Secondary | ICD-10-CM | POA: Diagnosis not present

## 2023-05-10 ENCOUNTER — Encounter: Payer: Self-pay | Admitting: Student

## 2023-05-11 DIAGNOSIS — M2560 Stiffness of unspecified joint, not elsewhere classified: Secondary | ICD-10-CM | POA: Diagnosis not present

## 2023-05-11 DIAGNOSIS — M79641 Pain in right hand: Secondary | ICD-10-CM | POA: Diagnosis not present

## 2023-05-11 DIAGNOSIS — G5601 Carpal tunnel syndrome, right upper limb: Secondary | ICD-10-CM | POA: Diagnosis not present

## 2023-05-11 DIAGNOSIS — M25631 Stiffness of right wrist, not elsewhere classified: Secondary | ICD-10-CM | POA: Diagnosis not present

## 2023-05-11 DIAGNOSIS — R531 Weakness: Secondary | ICD-10-CM | POA: Diagnosis not present

## 2023-05-21 ENCOUNTER — Other Ambulatory Visit: Payer: Self-pay | Admitting: Family Medicine

## 2023-05-21 DIAGNOSIS — F3289 Other specified depressive episodes: Secondary | ICD-10-CM

## 2023-05-24 DIAGNOSIS — G5601 Carpal tunnel syndrome, right upper limb: Secondary | ICD-10-CM | POA: Diagnosis not present

## 2023-05-25 ENCOUNTER — Encounter: Payer: Self-pay | Admitting: Family Medicine

## 2023-05-25 ENCOUNTER — Ambulatory Visit: Payer: Medicaid Other | Attending: Family Medicine

## 2023-05-25 ENCOUNTER — Other Ambulatory Visit: Payer: Self-pay

## 2023-05-25 ENCOUNTER — Ambulatory Visit: Payer: Medicaid Other | Admitting: Family Medicine

## 2023-05-25 VITALS — BP 112/73 | HR 60 | Temp 98.3°F | Ht 60.0 in | Wt 142.0 lb

## 2023-05-25 DIAGNOSIS — Z6827 Body mass index (BMI) 27.0-27.9, adult: Secondary | ICD-10-CM

## 2023-05-25 DIAGNOSIS — M6281 Muscle weakness (generalized): Secondary | ICD-10-CM | POA: Diagnosis not present

## 2023-05-25 DIAGNOSIS — K219 Gastro-esophageal reflux disease without esophagitis: Secondary | ICD-10-CM

## 2023-05-25 DIAGNOSIS — M25552 Pain in left hip: Secondary | ICD-10-CM | POA: Insufficient documentation

## 2023-05-25 DIAGNOSIS — F3289 Other specified depressive episodes: Secondary | ICD-10-CM | POA: Diagnosis not present

## 2023-05-25 DIAGNOSIS — D509 Iron deficiency anemia, unspecified: Secondary | ICD-10-CM

## 2023-05-25 DIAGNOSIS — E669 Obesity, unspecified: Secondary | ICD-10-CM

## 2023-05-25 DIAGNOSIS — Z9884 Bariatric surgery status: Secondary | ICD-10-CM

## 2023-05-25 MED ORDER — FERROUS GLUCONATE 324 (38 FE) MG PO TABS: 324.0000 mg | ORAL_TABLET | Freq: Every day | ORAL | 1 refills | Status: DC

## 2023-05-25 MED ORDER — BUPROPION HCL ER (XL) 300 MG PO TB24: 300.0000 mg | ORAL_TABLET | Freq: Every day | ORAL | 0 refills | Status: DC

## 2023-05-25 MED ORDER — PANTOPRAZOLE SODIUM 40 MG PO TBEC: 40.0000 mg | DELAYED_RELEASE_TABLET | Freq: Two times a day (BID) | ORAL | 0 refills | Status: DC

## 2023-05-25 NOTE — Progress Notes (Unsigned)
Office: [phone]  /  Fax: [phone]  WEIGHT SUMMARY AND BIOMETRICS  Starting Date: 10/07/22  Starting Weight: 154lb   Weight Lost Since Last Visit: 1lb   Vitals Temp: 98.3 F (36.8 C) BP: 112/73 Pulse Rate: 60 SpO2: 100 %   Body Composition  Body Fat %: 41.8 % Fat Mass (lbs): 59.6 lbs Muscle Mass (lbs): 79 lbs Total Body Water (lbs): 67.6 lbs Visceral Fat Rating : 7    HPI  Chief Complaint: OBESITY  Morgan [name] is here to discuss her progress with her obesity treatment plan. She is on the following a lower carbohydrate, vegetable and lean protein rich diet plan and states she is following her eating plan approximately 75 % of the time. She states she is exercising 0 minutes 0 times per week.  Interval History:  Since last office visit she is down 1 lb She has done well with healthy food choices, portion control and mindful eating She is keeping sweets to a minimum She is happy at her current weight She has a net weight loss of 12 lb in the past 7 mos She is now at her post op nadir weight s/p VSG in 2022 She plans to add in more consistent exercise  Pharmacotherapy: wellbutrin XL 150 mg daily  PHYSICAL EXAM:  Blood pressure 112/73, pulse 60, temperature 98.3 F (36.8 C), height 5' (1.524 m), weight 142 lb (64.4 kg), last menstrual period 04/04/2023, SpO2 100%, unknown if currently breastfeeding. Body mass index is 27.73 kg/m.  General: She is healthy appearing,  cooperative, alert, well developed, and in no acute distress. PSYCH: Has normal mood, affect and thought process.   Lungs: Normal breathing effort, no conversational dyspnea.   ASSESSMENT AND PLAN  TREATMENT PLAN FOR OBESITY:  Recommended Dietary Goals  Morgan [name] is currently in the action stage of change. As such, her goal is to continue weight management plan. She has agreed to practicing portion control and making smarter food choices, such as increasing vegetables and decreasing simple  carbohydrates.  Behavioral Intervention  We discussed the following Behavioral Modification Strategies today: increasing lean protein intake to established goals, increasing vegetables, increasing lower glycemic fruits, avoiding skipping meals, increasing water intake , work on meal planning and preparation, work on Counselling psychologist calories using tracking application, keeping healthy foods at home, planning for success, and continue to work on maintaining a reduced calorie state, getting the recommended amount of protein, incorporating whole foods, making healthy choices, staying well hydrated and practicing mindfulness when eating..  Additional resources provided today: NA  Recommended Physical Activity Goals  Morgan [name] has been advised to work up to 150 minutes of moderate intensity aerobic activity a week and strengthening exercises 2-3 times per week for cardiovascular health, weight loss maintenance and preservation of muscle mass.   She has agreed to [employer] strengthening exercises with a goal of 2-3 sessions a week   Pharmacotherapy changes for the treatment of obesity: none  ASSOCIATED CONDITIONS ADDRESSED TODAY  Other depression with emotional eating Assessment & Plan: Emotional eating under better control on Wellbutrin XL 300 mg daily Stress levels have been high  Continue to work on stress reduction and keeping junk food snacks out of the house  Orders: -     buPROPion HCl ER (XL); Take 1 tablet (300 mg total) by mouth daily.  Dispense: 30 tablet; Refill: 0  Iron deficiency anemia, unspecified iron deficiency anemia type Assessment & Plan: Taking Ferrous Gluconate 324 mg daily Energy level still fairly low  Awaiting hematology visit Has required IV iron infusions post sleeve gastrectomy  Continue to follow iron levels and keep upcoming visit with hematology  Orders: -     Ferrous Gluconate; Take 1 tablet (324 mg total) by mouth daily with breakfast.  Dispense: 30  tablet; Refill: 1  Gastroesophageal reflux disease, unspecified whether esophagitis present Assessment & Plan: Worsening despite pantoprazole 40 mg daily Having reflux daily with sour brash taste Denies epigastric pain, postprandial nausea, melena or hematochezia Referral to GI was placed last visit-- will need endoscopy Has been taking Naproxen 1-2 x most days of the week for back and hand pain, taking with food  Recommend reducing Naproxen and using Tylenol prn for pain Increase pantoprazole to 40 mg bid # provided to schedule with [facility] At high risk for GI ulceration post VSG  Orders: -     Pantoprazole Sodium; Take 1 tablet (40 mg total) by mouth 2 (two) times daily.  Dispense: 180 tablet; Refill: 0  Generalized obesity with starting BMI 30  BMI 27.0-27.9,adult  S/P laparoscopic sleeve gastrectomy      She was informed of the importance of frequent follow up visits to maximize her success with intensive lifestyle modifications for her multiple health conditions.   ATTESTASTION STATEMENTS:  Reviewed by clinician on day of visit: allergies, medications, problem list, medical history, surgical history, family history, social history, and previous encounter notes pertinent to obesity diagnosis.   I have personally spent 30 minutes total time today in preparation, patient care, nutritional counseling and documentation for this visit, including the following: review of clinical lab tests; review of medical tests/procedures/services.      [name], DO DABFM, DABOM [facility] [address] [place], [place] [address] [phone]

## 2023-05-25 NOTE — Therapy (Addendum)
OUTPATIENT PHYSICAL THERAPY EVALUATION   Patient Name: Morgan [name] MRN: [id number] DOB:1986-02-05, 37 y.o., female Today's Date: 05/25/2023  END OF SESSION:  PT End of Session - 05/25/23 1133     Visit Number 1    Number of Visits 17    Date for PT Re-Evaluation 07/20/23    Authorization Type [employer]    PT Start Time 1135    PT Stop Time 1215    PT Time Calculation (min) 40 min    Activity Tolerance Patient tolerated treatment well    Behavior During Therapy WFL for tasks assessed/performed             Past Medical History:  Diagnosis Date   Back pain    Complication of anesthesia    Patient states when given the epidural the numbness went up to her nose.    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   Gastroesophageal reflux disease without esophagitis 08/26/2022   Tachycardia 07/02/2022   Pain in both feet 05/07/2021   Fatigue due to excessive exertion 05/07/2021   Carpal tunnel syndrome, bilateral 10/12/2019   Chronic low back pain with right-sided sciatica 10/12/2019   Pes planus 10/12/2019   Vitamin D deficiency 10/12/2019   Healthcare maintenance 10/12/2019   Screening for hyperlipidemia 10/12/2019   Diabetes mellitus screening 10/12/2019   Heart palpitations 10/25/2018   IBS (irritable bowel syndrome) 02/11/2014   Iron deficiency anemia 01/05/2014    History of hepatitis B 12/11/2013   Generalized obesity with starting BMI 30 12/11/2013   Hyperprolactinemia (HCC) 06/01/2013    PCP: Tiffany Kocher, DO  REFERRING PROVIDER: Nestor Ramp, MD  REFERRING DIAG: Left hip pain [M25.552]   THERAPY DIAG:  Pain in left hip  Muscle weakness (generalized)  Rationale for Evaluation and Treatment: Rehabilitation  ONSET DATE: 3+ months   SUBJECTIVE:   SUBJECTIVE STATEMENT: Patient reports that her left hip has been hurting for a few months without known cause. She states that during the day, she only feels some resistance in the hip during the day. The pain is worst at night, especially if she rolls over to her left hip. Initially pain medicine was helpful for sleeping, but is providing little relief at this time. She has noticed that when she going from sitting on floor with legs crossed to standing up, "my gait is weird, which is coming from both hips." She also has difficulty performing kneel-to-stand and bending forward for her prayers. She does acknowledge that she has not been able to return to previous level of physical activity since having her last child who is 67 years old.   PERTINENT HISTORY: Relevant PMHx includes Chronic LBP with Rt-sided sciatica, DM, Hep B, IBS, OA, DOE, Depression, Pes planus   PAIN:  Are you having pain? Yes: NPRS scale:  9-10/10 Pain location: hip, LE  Pain description: "It feel very deep, like stabbing with a rod into my hip and radiating through my femur"  Aggravating factors: prolonged sitting, prolonged standing  Relieving factors: medicine  PRECAUTIONS: None  RED FLAGS: None   WEIGHT BEARING RESTRICTIONS: No  FALLS:  Has patient fallen in last 6 months? 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   NEXT MD VISIT: 06/08/2023 with  Dr. Claudean Severance, DO  OBJECTIVE:  Note: Objective measures were completed at Evaluation unless otherwise noted.  DIAGNOSTIC FINDINGS:   05/16/23: DG Hips bilat w or w/o pelvis   IMPRESSION: 1. No acute fracture or dislocation. 2. Mild levocurvature of the lumbar spine with left greater than right L5-S1 disc space narrowing.  PATIENT SURVEYS:  FOTO 29 current, 56 predicted  COGNITION: Overall cognitive status: Within functional limits for tasks assessed     SENSATION: WFL   POSTURE: No Significant postural limitations   LOWER EXTREMITY ROM: grossly WFL, however pain reported as below  Passive ROM Right eval Left eval  Hip flexion  P!  Hip extension    Hip abduction  P!  Hip adduction    Hip internal rotation  P!  Hip external rotation P! P!  Knee flexion    Knee extension    Ankle dorsiflexion    Ankle plantarflexion    Ankle inversion    Ankle eversion     (Blank rows = not tested)  LOWER EXTREMITY MMT:  MMT Right eval Left eval  Hip flexion 4- 3+  Hip extension 4- 3-  Hip abduction 4 4-  Hip adduction    Hip internal rotation 3- 2  Hip external rotation 3- 2  Knee flexion 4- 3+  Knee extension 4- 3+  Ankle dorsiflexion    Ankle plantarflexion    Ankle inversion    Ankle eversion     (Blank rows = not tested)  LOWER EXTREMITY SPECIAL TESTS:  LEFT Hip special tests: Luisa Hart (FABER) test: positive , Anterior hip impingement test: positive , and Piriformis test: positive   RIGHT  Hip special tests: Luisa Hart (FABER) test: positive , Anterior hip impingement test: negative, and Piriformis test: negative   GAIT: Distance walked: 50 ft Assistive device utilized: None Level of assistance: Complete Independence Comments: no significant gait deviations noted at time of evaluation    TODAY'S TREATMENT:                                                                                                                               [facility] Adult PT Treatment:  DATE: 05/25/2023  Initial evaluation: see patient education and home exercise program as noted below    PATIENT EDUCATION:  Education details: reviewed initial home exercise program; discussion of POC, prognosis and goals for skilled PT   Person educated: Patient Education method: Explanation, Demonstration, and Handouts Education comprehension: verbalized understanding, returned demonstration, and needs further education  HOME EXERCISE PROGRAM: Access Code: [email or web address] URL: [email or web address] Date: 05/25/2023 Prepared by: Mauri Reading  Exercises - Clamshell with Resistance  - 1 x daily - 3 x weekly - 2 sets - 10 reps - Supine Hip Adduction Isometric with Ball  - 1 x daily - 3 x weekly - 2 sets - 10 reps - 3-5 sec hold - Supine March with Resistance Band  - 1 x daily - 3 x weekly - 2 sets - 10 reps  ASSESSMENT:  CLINICAL IMPRESSION: Morgan [name] is a 37 y.o. female  who was seen today for physical therapy evaluation and treatment for Left Hip Pain that radiates distally. She is demonstrating diminished R LE MMT scores and pain with endrange PROM of left hip. She has related pain and difficulty with ambulation after prolonged sitting, bending forward, performing kneel-to-stand for daily prayers, and difficulty sleeping. She requires skilled PT services at this time to address relevant deficits and improve overall function.     OBJECTIVE IMPAIRMENTS: decreased activity tolerance, decreased endurance, difficulty walking, decreased strength, and pain.   ACTIVITY LIMITATIONS: bending, squatting, sleeping, bed mobility, and caring for others  PARTICIPATION LIMITATIONS: meal prep, cleaning, laundry, interpersonal relationship, driving, community activity, and occupation  PERSONAL FACTORS: Fitness, Past/current experiences, Profession, Time since onset of injury/illness/exacerbation, and 3+ comorbidities: Relevant PMHx includes Chronic LBP  with Rt-sided sciatica, DM, Hep B, IBS, OA, DOE, Depression, Pes planus  are also affecting patient's functional outcome.   REHAB POTENTIAL: Fair    CLINICAL DECISION MAKING: Evolving/moderate complexity  EVALUATION COMPLEXITY: Moderate   GOALS: Goals reviewed with patient? Yes  SHORT TERM GOALS: Target date: 06/22/2023   Patient will be independent with initial home program for hip strengthening program.  Baseline: provided at eval  Goal status: INITIAL   LONG TERM GOALS: Target date: 07/20/2023   Patient will report improved overall functional ability with FOTO score of 50 or greater.   Baseline: 29  Goal status: INITIAL  2.  Patient will report ability to perform kneel to stand with no more than 3/10 pain in order to perform her daily prayer.  Baseline: moderate-to-severe pain  Goal status: INITIAL  3.  Patient will demonstrate ability to perform floor-to-counter lifting of at least 20# in order to improve tolerance of normal household chores.  Baseline:  Goal status: INITIAL  4.  Patient will demonstrate at least 4/5 MMT with BIL LE strength testing.  Baseline: see objective measures  Goal status: INITIAL  5.  Patient will demonstrate ability to begin regular exercise program with frequency of at least 2-3x/week without exacerbation of symptoms.  Baseline: 0x/week  Goal status: INITIAL  6.  Patient will report ability to sleep through the night without waking from pain at least 5 days/week.  Baseline: waking up daily d/t pain  Goal status: INITIAL   PLAN:  PT FREQUENCY: 1-2x/week  PT DURATION: 8 weeks  PLANNED INTERVENTIONS: 97164- PT Re-evaluation, 97110-Therapeutic exercises, 97530- Therapeutic activity, 97112- Neuromuscular re-education, 97535- Self Care, 29528- Manual therapy, Patient/Family education, Taping, Dry Needling, Joint mobilization, Joint manipulation, Spinal manipulation, Spinal mobilization, Cryotherapy, and Moist heat  PLAN FOR NEXT  SESSION: hip joint mobilization, STM  and TPDN as indicated, modalities as appropriate, low-impact aerobic activity for symptom modulation, LE strengthening program, updated HEP and promote independence    Mauri Reading, PT, DPT   05/25/2023, 4:20 PM   For all possible CPT codes, reference the Planned Interventions line above.     Check all conditions that are expected to impact treatment: {Conditions expected to impact treatment:Diabetes mellitus and Musculoskeletal disorders   If treatment provided at initial evaluation, no treatment charged due to lack of authorization.

## 2023-05-25 NOTE — Addendum Note (Signed)
Addended by: Mauri Reading on: 05/25/2023 04:26 PM   Modules accepted: Orders

## 2023-05-26 ENCOUNTER — Encounter: Payer: Self-pay | Admitting: Pediatrics

## 2023-05-26 DIAGNOSIS — G5601 Carpal tunnel syndrome, right upper limb: Secondary | ICD-10-CM | POA: Diagnosis not present

## 2023-05-26 DIAGNOSIS — M25631 Stiffness of right wrist, not elsewhere classified: Secondary | ICD-10-CM | POA: Diagnosis not present

## 2023-05-26 DIAGNOSIS — M79641 Pain in right hand: Secondary | ICD-10-CM | POA: Diagnosis not present

## 2023-05-26 DIAGNOSIS — R531 Weakness: Secondary | ICD-10-CM | POA: Diagnosis not present

## 2023-05-26 DIAGNOSIS — M2560 Stiffness of unspecified joint, not elsewhere classified: Secondary | ICD-10-CM | POA: Diagnosis not present

## 2023-05-26 NOTE — Assessment & Plan Note (Signed)
Emotional eating under better control on Wellbutrin XL 300 mg daily Stress levels have been high  Continue to work on stress reduction and keeping junk food snacks out of the house

## 2023-05-26 NOTE — Assessment & Plan Note (Signed)
Taking Ferrous Gluconate 324 mg daily Energy level still fairly low Awaiting hematology visit Has required IV iron infusions post sleeve gastrectomy  Continue to follow iron levels and keep upcoming visit with hematology

## 2023-05-26 NOTE — Assessment & Plan Note (Signed)
Worsening despite pantoprazole 40 mg daily Having reflux daily with sour brash taste Denies epigastric pain, postprandial nausea, melena or hematochezia Referral to GI was placed last visit-- will need endoscopy Has been taking Naproxen 1-2 x most days of the week for back and hand pain, taking with food  Recommend reducing Naproxen and using Tylenol prn for pain Increase pantoprazole to 40 mg bid # provided to schedule with [facility] At high risk for GI ulceration post VSG

## 2023-05-31 ENCOUNTER — Encounter: Payer: Medicaid Other | Admitting: Oncology

## 2023-06-01 ENCOUNTER — Inpatient Hospital Stay: Payer: Medicaid Other | Attending: Oncology | Admitting: Oncology

## 2023-06-01 ENCOUNTER — Telehealth: Payer: Self-pay

## 2023-06-01 ENCOUNTER — Encounter: Payer: Self-pay | Admitting: Oncology

## 2023-06-01 ENCOUNTER — Inpatient Hospital Stay: Payer: Medicaid Other

## 2023-06-01 VITALS — BP 104/74 | HR 64 | Temp 98.1°F | Resp 15 | Ht 60.5 in | Wt 146.1 lb

## 2023-06-01 DIAGNOSIS — D72819 Decreased white blood cell count, unspecified: Secondary | ICD-10-CM | POA: Insufficient documentation

## 2023-06-01 DIAGNOSIS — D509 Iron deficiency anemia, unspecified: Secondary | ICD-10-CM | POA: Diagnosis not present

## 2023-06-01 DIAGNOSIS — D500 Iron deficiency anemia secondary to blood loss (chronic): Secondary | ICD-10-CM

## 2023-06-01 LAB — CBC WITH DIFFERENTIAL/PLATELET
Abs Immature Granulocytes: 0.01 10*3/uL (ref 0.00–0.07)
Basophils Absolute: 0 10*3/uL (ref 0.0–0.1)
Basophils Relative: 1 %
Eosinophils Absolute: 0.1 10*3/uL (ref 0.0–0.5)
Eosinophils Relative: 3 %
HCT: 37 % (ref 36.0–46.0)
Hemoglobin: 11.1 g/dL — ABNORMAL LOW (ref 12.0–15.0)
Immature Granulocytes: 0 %
Lymphocytes Relative: 56 %
Lymphs Abs: 2.1 10*3/uL (ref 0.7–4.0)
MCH: 22.9 pg — ABNORMAL LOW (ref 26.0–34.0)
MCHC: 30 g/dL (ref 30.0–36.0)
MCV: 76.3 fL — ABNORMAL LOW (ref 80.0–100.0)
Monocytes Absolute: 0.3 10*3/uL (ref 0.1–1.0)
Monocytes Relative: 7 %
Neutro Abs: 1.2 10*3/uL — ABNORMAL LOW (ref 1.7–7.7)
Neutrophils Relative %: 33 %
Platelets: 273 10*3/uL (ref 150–400)
RBC: 4.85 MIL/uL (ref 3.87–5.11)
RDW: 13.9 % (ref 11.5–15.5)
WBC: 3.8 10*3/uL — ABNORMAL LOW (ref 4.0–10.5)
nRBC: 0 % (ref 0.0–0.2)

## 2023-06-01 LAB — DIRECT ANTIGLOBULIN TEST (NOT AT ARMC)
DAT, IgG: NEGATIVE
DAT, complement: NEGATIVE

## 2023-06-01 LAB — FERRITIN: Ferritin: 9 ng/mL — ABNORMAL LOW (ref 11–307)

## 2023-06-01 LAB — FOLATE: Folate: 9.6 ng/mL (ref >5.9)

## 2023-06-01 LAB — LACTATE DEHYDROGENASE: LDH: 130 U/L (ref 98–192)

## 2023-06-01 LAB — IRON AND TIBC
Iron: 23 ug/dL — ABNORMAL LOW (ref 28–170)
Saturation Ratios: 5 % — ABNORMAL LOW (ref 10.4–31.8)
TIBC: 473 ug/dL — ABNORMAL HIGH (ref 250–450)
UIBC: 450 ug/dL

## 2023-06-01 LAB — VITAMIN B12: Vitamin B-12: 305 pg/mL (ref 180–914)

## 2023-06-01 MED FILL — Ferumoxytol Inj 510 MG/17ML (30 MG/ML) (Elemental Fe): INTRAVENOUS | Qty: 17 | Status: AC

## 2023-06-01 NOTE — Telephone Encounter (Signed)
Spoke with patient via phone to confirm infusion appointment for 06/11/23 at 1000. Understood

## 2023-06-01 NOTE — Assessment & Plan Note (Signed)
Persistent despite long-term oral iron supplementation. Symptoms include fatigue, shortness of breath, palpitations, and chest tightness.   -Labs today showed hemoglobin of 11.1, hematocrit 37, MCV 76.3.  White count 3800 with normal differential.  Platelet count normal at 273,000.  Iron studies continue to show evidence of iron deficiency with ferritin of 9, iron saturation of 5%, iron decreased at 23.  B12, folate, LDH are within normal limits.  Coombs test negative.  -Given persistent iron deficiency anemia, we will proceed with IV iron using Feraheme weekly x 2 doses starting from tomorrow.  -Patient apparently had testing for sickle cell disease, thalassemia in the past when she was living abroad (in [place]) and workup was negative per her verbal report.  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  -RTC in 3 months for repeat labs and follow-up.

## 2023-06-01 NOTE — Progress Notes (Signed)
[facility]  HEMATOLOGY CLINIC CONSULTATION NOTE    PATIENT NAME: Morgan [name]   MR#: [id number] DOB: 12/27/1985  DATE OF SERVICE: 06/01/2023  Patient Care Team: Tiffany Kocher, DO as PCP - General (Family Medicine) Orbie Pyo, MD as PCP - Cardiology (Cardiology)  REASON FOR CONSULTATION/ CHIEF COMPLAINT:  Evaluation of anemia.  HISTORY OF PRESENT ILLNESS:  Morgan [name] is a 37 y.o. lady with a past medical history of GERD, irritable bowel syndrome, s/p laparoscopic sleeve gastrectomy in 2022, arthritis, was referred to our service for evaluation of anemia.    Discussed the use of AI scribe software for clinical note transcription with the patient, who gave verbal consent to proceed.   Labs at her PCPs office on 04/21/2023 showed hemoglobin of 10.8, hematocrit 36.7, MCV 74.  White count was 2900.  Platelet count was normal at 227,000.  Iron saturation was decreased at 7%, ferritin borderline low at 16.  Previously labs in April 2024 showed hemoglobin of 10.9, MCV 70.  White count was 3600 at that time.  Given persistent anemia, referral was sent to [place] for further evaluation.  She presents with persistent iron deficiency despite long-term oral iron supplementation. The patient reports experiencing fatigue, shortness of breath, palpitations, and occasional chest tightness. The patient also mentions having carpal tunnel surgery and experiencing generalized fatigue, myalgia, and bone pain. Despite these symptoms, the patient's hemoglobin levels remain around ten. The patient also reports a history of low white blood cell count. The patient has been on pantoprazole for over two years due to GERD and cannot tolerate NSAIDs due to severe gastritis. The patient also reports having heavy menstrual cycles.  She had not noticed any recent bleeding such as epistaxis, hematuria or hematochezia   MEDICAL HISTORY:  Past Medical History:  Diagnosis Date   Back pain     Complication of anesthesia    Patient states when given the epidural the numbness went up to her nose.    Diabetes mellitus without complication (HCC)    Edema    Heartburn    Hepatitis    Hepatitis B carrier (HCC)    IBS (irritable bowel syndrome)    Osteoarthritis    Palpitations    Vitamin D deficiency     SURGICAL HISTORY: Past Surgical History:  Procedure Laterality Date   CARPAL TUNNEL RELEASE     gastric balloon  01/28/2021   ocular muscle corrective surgery     SLEEVE GASTROPLASTY  2022    SOCIAL HISTORY: She reports that she has never smoked. She has never been exposed to tobacco smoke. She has never used smokeless tobacco. She reports that she does not drink alcohol and does not use drugs. Social History   Socioeconomic History   Marital status: Married    Spouse name: Olena Heckle   Number of children: 3   Years of education: Not on file   Highest education level: Not on file  Occupational History   Not on file  Tobacco Use   Smoking status: Never    Passive exposure: Never   Smokeless tobacco: Never  Vaping Use   Vaping status: Never Used  Substance and Sexual Activity   Alcohol use: No    Alcohol/week: 0.0 standard drinks of alcohol   Drug use: No   Sexual activity: Not Currently    Partners: Male    Birth control/protection: None  Other Topics Concern   Not on file  Social History Narrative   **  Merged History Encounter **       Social Determinants of Health   Financial Resource Strain: Not on file  Food Insecurity: No Food Insecurity (06/01/2023)   Hunger Vital Sign    Worried About Running Out of Food in the Last Year: Never true    Ran Out of Food in the Last Year: Never true  Transportation Needs: No Transportation Needs (06/01/2023)   PRAPARE - Administrator, Civil Service (Medical): No    Lack of Transportation (Non-Medical): No  Physical Activity: Not on file  Stress: Not on file  Social Connections: Unknown  (11/04/2021)   [facility]   Social Network    Social Network: Not on file  Intimate Partner Violence: Not At Risk (06/01/2023)   Humiliation, Afraid, Rape, and Kick questionnaire    Fear of Current or Ex-Partner: No    Emotionally Abused: No    Physically Abused: No    Sexually Abused: No    FAMILY HISTORY: Family History  Problem Relation Age of Onset   Hypertension Mother    Hypertension Father    Hyperlipidemia Father    Alcohol abuse Neg Hx    Arthritis Neg Hx    Asthma Neg Hx    Birth defects Neg Hx    Cancer Neg Hx    COPD Neg Hx    Depression Neg Hx    Diabetes Neg Hx    Drug abuse Neg Hx    Early death Neg Hx    Hearing loss Neg Hx    Heart disease Neg Hx    Kidney disease Neg Hx    Learning disabilities Neg Hx    Mental illness Neg Hx    Mental retardation Neg Hx    Miscarriages / Stillbirths Neg Hx    Stroke Neg Hx    Vision loss Neg Hx     ALLERGIES:  She is allergic to metronidazole.  MEDICATIONS:  Current Outpatient Medications  Medication Sig Dispense Refill   buPROPion (WELLBUTRIN XL) 300 MG 24 hr tablet Take 1 tablet (300 mg total) by mouth daily. 30 tablet 0   ferrous gluconate (FERGON) 324 MG tablet Take 1 tablet (324 mg total) by mouth daily with breakfast. 30 tablet 1   Multiple Vitamin (MULTIVITAMIN) tablet Take 1 tablet by mouth daily.     pantoprazole (PROTONIX) 40 MG tablet Take 1 tablet (40 mg total) by mouth 2 (two) times daily. 180 tablet 0   No current facility-administered medications for this visit.    REVIEW OF SYSTEMS:    Review of Systems  Constitutional:  Positive for fatigue.  Respiratory:  Positive for chest tightness and shortness of breath (on exertion).   Musculoskeletal:  Positive for arthralgias and myalgias.    All other pertinent systems were reviewed and were negative except as mentioned above.  PHYSICAL EXAMINATION:  ECOG PERFORMANCE STATUS: 1 - Symptomatic but completely  ambulatory  Vitals:   06/01/23 0907 06/01/23 0921  BP: 97/73 104/74  Pulse: 64   Resp: 15   Temp: 98.1 F (36.7 C)   SpO2: 100%    Filed Weights   06/01/23 0907  Weight: 146 lb 1.6 oz (66.3 kg)    Physical Exam Constitutional:      General: She is not in acute distress.    Appearance: Normal appearance.  HENT:     Head: Normocephalic and atraumatic.  Eyes:     Conjunctiva/sclera: Conjunctivae normal.  Cardiovascular:  Rate and Rhythm: Normal rate and regular rhythm.  Pulmonary:     Effort: Pulmonary effort is normal.  Abdominal:     General: There is no distension.  Musculoskeletal:     Right lower leg: No edema.     Left lower leg: No edema.  Neurological:     General: No focal deficit present.     Mental Status: She is alert and oriented to person, place, and time.  Psychiatric:        Mood and Affect: Mood normal.        Behavior: Behavior normal.        Thought Content: Thought content normal.     LABORATORY DATA:   I have reviewed the data as listed.  Results for orders placed or performed in visit on 06/01/23  Lactate dehydrogenase  Result Value Ref Range   LDH 130 98 - 192 U/L  Folate  Result Value Ref Range   Folate 9.6 >5.9 ng/mL  Vitamin B12  Result Value Ref Range   Vitamin B-12 305 180 - 914 pg/mL  Ferritin  Result Value Ref Range   Ferritin 9 (L) 11 - 307 ng/mL  CBC with Differential/Platelet  Result Value Ref Range   WBC 3.8 (L) 4.0 - 10.5 K/uL   RBC 4.85 3.87 - 5.11 MIL/uL   Hemoglobin 11.1 (L) 12.0 - 15.0 g/dL   HCT 09.8 11.9 - 14.7 %   MCV 76.3 (L) 80.0 - 100.0 fL   MCH 22.9 (L) 26.0 - 34.0 pg   MCHC 30.0 30.0 - 36.0 g/dL   RDW 82.9 56.2 - 13.0 %   Platelets 273 150 - 400 K/uL   nRBC 0.0 0.0 - 0.2 %   Neutrophils Relative % 33 %   Neutro Abs 1.2 (L) 1.7 - 7.7 K/uL   Lymphocytes Relative 56 %   Lymphs Abs 2.1 0.7 - 4.0 K/uL   Monocytes Relative 7 %   Monocytes Absolute 0.3 0.1 - 1.0 K/uL   Eosinophils Relative 3 %    Eosinophils Absolute 0.1 0.0 - 0.5 K/uL   Basophils Relative 1 %   Basophils Absolute 0.0 0.0 - 0.1 K/uL   Immature Granulocytes 0 %   Abs Immature Granulocytes 0.01 0.00 - 0.07 K/uL  Iron and TIBC  Result Value Ref Range   Iron 23 (L) 28 - 170 ug/dL   TIBC 865 (H) 784 - 696 ug/dL   Saturation Ratios 5 (L) 10.4 - 31.8 %   UIBC 450 ug/dL  Direct antiglobulin test (not at [facility])  Result Value Ref Range   DAT, complement NEG    DAT, IgG      NEG Performed at [facility], [address], [place], [place] [address]     RADIOGRAPHIC STUDIES:  No pertinent imaging studies available to review.   ASSESSMENT & PLAN:   37 y.o. lady with a past medical history of GERD, irritable bowel syndrome, s/p laparoscopic sleeve gastrectomy in 2022, arthritis, was referred to our service for evaluation of anemia.    Iron deficiency anemia Persistent despite long-term oral iron supplementation. Symptoms include fatigue, shortness of breath, palpitations, and chest tightness.   -Labs today showed hemoglobin of 11.1, hematocrit 37, MCV 76.3.  White count 3800 with normal differential.  Platelet count normal at 273,000.  Iron studies continue to show evidence of iron deficiency with ferritin of 9, iron saturation of 5%, iron decreased at 23.  B12, folate, LDH are within normal limits.  Coombs  test negative.  -Given persistent iron deficiency anemia, we will proceed with IV iron using Feraheme weekly x 2 doses starting from tomorrow.  -Patient apparently had testing for sickle cell disease, thalassemia in the past when she was living abroad (in [place]) and workup was negative per her verbal report.  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  -RTC in 3 months for repeat labs and follow-up.  Leukopenia Chronic low white blood cell count.  Apparently she [place]ological [place].  No known cause identified yet.  -White count is better at 3800 today with normal  differential.  B12, folate are within normal limits today.  Checked ANA for additional workup.  Will follow-up on results.  -She is asymptomatic from leukopenia.  We can continue monitoring at current levels.   Since the cause of anemia seems to be obvious from iron deficiency, I am not pursuing extensive workup at this time.  If inadequate response to IV iron is noted, we will pursue workup to rule out other etiologies.  Orders Placed This Encounter  Procedures   CBC with Differential/Platelet    Standing Status:   Future    Number of Occurrences:   1    Standing Expiration Date:   05/31/2024   Ferritin    Standing Status:   Future    Number of Occurrences:   1    Standing Expiration Date:   05/31/2024   Vitamin B12    Standing Status:   Future    Number of Occurrences:   1    Standing Expiration Date:   05/31/2024   Folate    Standing Status:   Future    Number of Occurrences:   1    Standing Expiration Date:   05/31/2024   ANA w/Reflex if Positive    Standing Status:   Future    Number of Occurrences:   1    Standing Expiration Date:   05/31/2024   Lactate dehydrogenase    Standing Status:   Future    Number of Occurrences:   1    Standing Expiration Date:   05/31/2024   Haptoglobin    Standing Status:   Future    Number of Occurrences:   1    Standing Expiration Date:   05/31/2024   Direct antiglobulin test (not at [facility])    Standing Status:   Future    Number of Occurrences:   1    Standing Expiration Date:   05/31/2024     I spent a total of 55 minutes during this encounter with the patient including review of chart and various tests results, discussions about plan of care and coordination of care plan.  I reviewed lab results and outside records for this visit and discussed relevant results with the patient. Diagnosis, plan of care and treatment options were also discussed in detail with the patient. Opportunity provided to ask questions and answers provided to her  apparent satisfaction. Provided instructions to call our clinic with any problems, questions or concerns prior to return visit. I recommended to continue follow-up with PCP and sub-specialists. She verbalized understanding and agreed with the plan. No barriers to learning was detected.   Future Appointments  Date Time Provider Department Center  06/02/2023  8:30 AM [facility] INFUSION [facility] None  06/03/2023 11:30 AM Berta Minor, PTA [facility]  06/04/2023  8:30 AM Mauri Reading, PT [facility]  06/08/2023  2:50 PM Tiffany Kocher, DO [facility]  06/09/2023 10:00  AM Berta Minor, PTA [facility]  06/11/2023  8:30 AM Mauri Reading, PT [facility]  06/11/2023 10:00 AM [facility] INFUSION [facility] None  06/14/2023  8:30 AM Mauri Reading, PT [facility]  06/17/2023 10:15 AM Jenelle Mages, PT [facility]  06/22/2023 11:30 AM Berta Minor, PTA [facility]  06/24/2023  9:15 AM Berta Minor, PTA [facility]  06/29/2023 10:00 AM Mauri Reading, PT [facility]  07/01/2023  8:30 AM Mauri Reading, PT [facility]  07/06/2023  8:30 AM Berta Minor, PTA [facility]  07/08/2023  8:30 AM Mauri Reading, PT [facility]  07/12/2023  8:20 AM Bowen, Scot Jun, DO [facility] None  07/13/2023  8:30 AM Berta Minor, PTA [facility]  07/15/2023  8:30 AM Mauri Reading, PT [facility]  07/16/2023  8:30 AM McGreal, Durene Romans, MD [facility]  09/06/2023 10:30 AM [facility] PHLEBOTOMIST [facility] None  09/06/2023 11:00 AM Jaaron Oleson, Archie Patten, MD [facility] None     Meryl Crutch, MD [facility] - A DEPT OF [facility] [employer] [address]  [place] [address] Dept: [phone] Dept Fax: [phone]  06/01/2023 3:08 PM   This document was completed utilizing speech recognition software. Grammatical errors, random word insertions, pronoun errors, and incomplete  sentences are an occasional consequence of this system due to software limitations, ambient noise, and hardware issues. Any formal questions or concerns about the content, text or information contained within the body of this dictation should be directly addressed to the provider for clarification.

## 2023-06-01 NOTE — Assessment & Plan Note (Signed)
Chronic low white blood cell count.  [place].  No known cause identified yet.  -White count is better at 3800 today with normal differential.  B12, folate are within normal limits today.  Checked ANA for additional workup.  Will follow-up on results.  -She is asymptomatic from leukopenia.  We can continue monitoring at current levels.

## 2023-06-02 ENCOUNTER — Inpatient Hospital Stay: Payer: Medicaid Other

## 2023-06-02 VITALS — BP 106/74 | HR 66 | Temp 98.3°F | Resp 16

## 2023-06-02 DIAGNOSIS — D509 Iron deficiency anemia, unspecified: Secondary | ICD-10-CM | POA: Diagnosis not present

## 2023-06-02 DIAGNOSIS — M79641 Pain in right hand: Secondary | ICD-10-CM | POA: Diagnosis not present

## 2023-06-02 DIAGNOSIS — D500 Iron deficiency anemia secondary to blood loss (chronic): Secondary | ICD-10-CM

## 2023-06-02 LAB — ANA W/REFLEX IF POSITIVE: Anti Nuclear Antibody (ANA): NEGATIVE

## 2023-06-02 LAB — HAPTOGLOBIN: Haptoglobin: 123 mg/dL (ref 33–278)

## 2023-06-02 MED ORDER — FERUMOXYTOL INJECTION 510 MG/17 ML
510.0000 mg | Freq: Once | INTRAVENOUS | Status: AC
  Administered 2023-06-02: 510 mg via INTRAVENOUS
  Filled 2023-06-02: qty 510

## 2023-06-02 MED ORDER — DIPHENHYDRAMINE HCL 25 MG PO CAPS
25.0000 mg | ORAL_CAPSULE | Freq: Once | ORAL | Status: AC
  Administered 2023-06-02: 25 mg via ORAL
  Filled 2023-06-02: qty 1

## 2023-06-02 MED ORDER — SODIUM CHLORIDE 0.9 % IV SOLN: Freq: Once | INTRAVENOUS | Status: AC

## 2023-06-02 MED ORDER — SODIUM CHLORIDE 0.9% FLUSH: 10.0000 mL | Freq: Two times a day (BID) | INTRAVENOUS | Status: DC

## 2023-06-02 MED ORDER — ACETAMINOPHEN 325 MG PO TABS
650.0000 mg | ORAL_TABLET | Freq: Once | ORAL | Status: AC
  Administered 2023-06-02: 650 mg via ORAL
  Filled 2023-06-02: qty 2

## 2023-06-02 NOTE — Patient Instructions (Signed)
 Ferumoxytol Injection What is this medication? FERUMOXYTOL (FER ue MOX i tol) treats low levels of iron in your body (iron deficiency anemia). Iron is a mineral that plays an important role in making red blood cells, which carry oxygen from your lungs to the rest of your body. This medicine may be used for other purposes; ask your health care provider or pharmacist if you have questions. COMMON BRAND NAME(S): Feraheme What should I tell my care team before I take this medication? They need to know if you have any of these conditions: Anemia not caused by low iron levels High levels of iron in the blood Magnetic resonance imaging (MRI) test scheduled An unusual or allergic reaction to iron, other medications, foods, dyes, or preservatives Pregnant or trying to get pregnant Breastfeeding How should I use this medication? This medication is injected into a vein. It is given by your care team in a hospital or clinic setting. Talk to your care team the use of this medication in children. Special care may be needed. Overdosage: If you think you have taken too much of this medicine contact a poison control center or emergency room at once. NOTE: This medicine is only for you. Do not share this medicine with others. What if I miss a dose? It is important not to miss your dose. Call your care team if you are unable to keep an appointment. What may interact with this medication? Other iron products This list may not describe all possible interactions. Give your health care provider a list of all the medicines, herbs, non-prescription drugs, or dietary supplements you use. Also tell them if you smoke, drink alcohol, or use illegal drugs. Some items may interact with your medicine. What should I watch for while using this medication? Visit your care team regularly. Tell your care team if your symptoms do not start to get better or if they get worse. You may need blood work done while you are taking this  medication. You may need to follow a special diet. Talk to your care team. Foods that contain iron include: whole grains/cereals, dried fruits, beans, or peas, leafy green vegetables, and organ meats (liver, kidney). What side effects may I notice from receiving this medication? Side effects that you should report to your care team as soon as possible: Allergic reactions--skin rash, itching, hives, swelling of the face, lips, tongue, or throat Low blood pressure--dizziness, feeling faint or lightheaded, blurry vision Shortness of breath Side effects that usually do not require medical attention (report to your care team if they continue or are bothersome): Flushing Headache Joint pain Muscle pain Nausea Pain, redness, or irritation at injection site This list may not describe all possible side effects. Call your doctor for medical advice about side effects. You may report side effects to FDA at 1-800-FDA-1088. Where should I keep my medication? This medication is given in a hospital or clinic. It will not be stored at home. NOTE: This sheet is a summary. It may not cover all possible information. If you have questions about this medicine, talk to your doctor, pharmacist, or health care provider.  2024 Elsevier/Gold Standard (2022-11-13 00:00:00)

## 2023-06-03 ENCOUNTER — Ambulatory Visit: Payer: Medicaid Other

## 2023-06-03 DIAGNOSIS — M6281 Muscle weakness (generalized): Secondary | ICD-10-CM

## 2023-06-03 DIAGNOSIS — M25552 Pain in left hip: Secondary | ICD-10-CM | POA: Diagnosis not present

## 2023-06-03 NOTE — Therapy (Signed)
OUTPATIENT PHYSICAL THERAPY TREATMENT NOTE   Patient Name: Morgan [name] MRN: [id number] DOB:[birth year], 37 y.o., female Today's Date: 06/03/2023  END OF SESSION:  PT End of Session - 06/03/23 1134     Visit Number 2    Number of Visits 17    Date for PT Re-Evaluation 07/20/23    Authorization Type [employer]    Authorization Time Period 7 visits approved 05/31/23-07/29/23    Authorization - Visit Number 1    Authorization - Number of Visits 7    PT Start Time 1133    PT Stop Time 1213    PT Time Calculation (min) 40 min    Activity Tolerance Patient tolerated treatment well    Behavior During Therapy WFL for tasks assessed/performed             Past Medical History:  Diagnosis Date   Back pain    Complication of anesthesia    Patient states when given the epidural the numbness went up to her nose.    Diabetes mellitus without complication (HCC)    Edema    Heartburn    Hepatitis    Hepatitis B carrier (HCC)    IBS (irritable bowel syndrome)    Osteoarthritis    Palpitations    Vitamin D deficiency    Past Surgical History:  Procedure Laterality Date   CARPAL TUNNEL RELEASE     gastric balloon  01/28/2021   ocular muscle corrective surgery     SLEEVE GASTROPLASTY  2022   Patient Active Problem List   Diagnosis Date Noted   Leukopenia 06/01/2023   Other fatigue 10/07/2022   SOBOE (shortness of breath on exertion) 10/07/2022   Depression 10/07/2022   Gastroesophageal reflux disease 10/07/2022   Depression screen 10/07/2022   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   Gastroesophageal reflux disease without esophagitis 08/26/2022   Tachycardia 07/02/2022   Pain in both feet 05/07/2021   Fatigue due to excessive exertion 05/07/2021   Carpal tunnel syndrome, bilateral 10/12/2019   Chronic low back pain with right-sided sciatica 10/12/2019   Pes planus 10/12/2019   Vitamin D deficiency 10/12/2019   Healthcare maintenance 10/12/2019    Screening for hyperlipidemia 10/12/2019   Diabetes mellitus screening 10/12/2019   Heart palpitations 10/25/2018   IBS (irritable bowel syndrome) 02/11/2014   Iron deficiency anemia 01/05/2014   History of hepatitis B 12/11/2013   Generalized obesity with starting BMI 30 12/11/2013   Hyperprolactinemia (HCC) 06/01/2013    PCP: Tiffany Kocher, DO  REFERRING PROVIDER: Nestor Ramp, MD  REFERRING DIAG: Left hip pain [M25.552]   THERAPY DIAG:  Pain in left hip  Muscle weakness (generalized)  Rationale for Evaluation and Treatment: Rehabilitation  ONSET DATE: 3+ months   SUBJECTIVE:   SUBJECTIVE STATEMENT: Patient reports continued hip pain but that it is mostly manageable with OTC pain medication.  EVAL: Patient reports that her left hip has been hurting for a few months without known cause. She states that during the day, she only feels some resistance in the hip during the day. The pain is worst at night, especially if she rolls over to her left hip. Initially pain medicine was helpful for sleeping, but is providing little relief at this time. She has noticed that when she going from sitting on floor with legs crossed to standing up, "my gait is weird, which is coming from both hips." She also has difficulty performing kneel-to-stand and bending forward for her prayers. She does acknowledge that  she has not been able to return to previous level of physical activity since having her last child who is 75 years old.   PERTINENT HISTORY: Relevant PMHx includes Chronic LBP with Rt-sided sciatica, DM, Hep B, IBS, OA, DOE, Depression, Pes planus   PAIN:  Are you having pain? Yes: NPRS scale: 9-10/10 Pain location: hip, LE  Pain description: "It feel very deep, like stabbing with a rod into my hip and radiating through my femur"  Aggravating factors: prolonged sitting, prolonged standing  Relieving factors: medicine  PRECAUTIONS: None  RED FLAGS: None   WEIGHT BEARING  RESTRICTIONS: No  FALLS:  Has patient fallen in last 6 months? 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   NEXT MD VISIT: 06/08/2023 with Dr. Claudean Severance, DO  OBJECTIVE:  Note: Objective measures were completed at Evaluation unless otherwise noted.  DIAGNOSTIC FINDINGS:   05/16/23: DG Hips bilat w or w/o pelvis   IMPRESSION: 1. No acute fracture or dislocation. 2. Mild levocurvature of the lumbar spine with left greater than right L5-S1 disc space narrowing.  PATIENT SURVEYS:  FOTO 29 current, 56 predicted  COGNITION: Overall cognitive status: Within functional limits for tasks assessed     SENSATION: WFL   POSTURE: No Significant postural limitations   LOWER EXTREMITY ROM: grossly WFL, however pain reported as below  Passive ROM Right eval Left eval  Hip flexion  P!  Hip extension    Hip abduction  P!  Hip adduction    Hip internal rotation  P!  Hip external rotation P! P!  Knee flexion    Knee extension    Ankle dorsiflexion    Ankle plantarflexion    Ankle inversion    Ankle eversion     (Blank rows = not tested)  LOWER EXTREMITY MMT:  MMT Right eval Left eval  Hip flexion 4- 3+  Hip extension 4- 3-  Hip abduction 4 4-  Hip adduction    Hip internal rotation 3- 2  Hip external rotation 3- 2  Knee flexion 4- 3+  Knee extension 4- 3+  Ankle dorsiflexion    Ankle plantarflexion    Ankle inversion    Ankle eversion     (Blank rows = not tested)  LOWER EXTREMITY SPECIAL TESTS:  LEFT Hip special tests: Luisa Hart (FABER) test: positive , Anterior hip impingement test: positive , and Piriformis test: positive   RIGHT  Hip special tests: Luisa Hart (FABER) test: positive , Anterior hip impingement test: negative, and Piriformis test: negative   GAIT: Distance walked: 50 ft Assistive  device utilized: None Level of assistance: Complete Independence Comments: no significant gait deviations noted at time of evaluation    TODAY'S TREATMENT:       [facility] Adult PT Treatment:                                                DATE: 06/03/23 Therapeutic Exercise: Nustep level 5 x 5 mins while gathering subjective info Standing hip abduction/extension x10 BIL Sidestepping at counter x4 laps Seated hip adduction ball squeeze 5" hold 2x10 Seated hip adduction with hip IR YTB at ankles 2x10 Seated LAQ 2# 2x10 BIL Seated marching 2# x30" (painful) Seated hamstring curl RTB  2x10 BIL Bridges 2x10 Supine figure 4 stretch LLE 2x30" SLR x10 LLE (painful) STS x10                                                                                                                    [facility] Adult PT Treatment:                                                DATE: 05/25/2023  Initial evaluation: see patient education and home exercise program as noted below    PATIENT EDUCATION:  Education details: reviewed initial home exercise program; discussion of POC, prognosis and goals for skilled PT   Person educated: Patient Education method: Explanation, Demonstration, and Handouts Education comprehension: verbalized understanding, returned demonstration, and needs further education  HOME EXERCISE PROGRAM: Access Code: [email or web address] URL: [email or web address] Date: 05/25/2023 Prepared by: Mauri Reading  Exercises - Clamshell with Resistance  - 1 x daily - 3 x weekly - 2 sets - 10 reps - Supine Hip Adduction Isometric with Ball  - 1 x daily - 3 x weekly - 2 sets - 10 reps - 3-5 sec hold - Supine March with Resistance Band  - 1 x daily - 3 x weekly - 2 sets - 10 reps  ASSESSMENT:  CLINICAL IMPRESSION: Patient presents to PT reporting continued Lt hip pain and that she has been compliant with her HEP. Session today focused on proximal hip and LE strengthening. She has pain with most  motions and displays more limited ROM on LLE. Patient continues to benefit from skilled PT services and should be progressed as able to improve functional independence.   EVAL: Valorie is a 37 y.o. female  who was seen today for physical therapy evaluation and treatment for Left Hip Pain that radiates distally. She is demonstrating diminished R LE MMT scores and pain with endrange PROM of left hip. She has related pain and difficulty with ambulation after prolonged sitting, bending forward, performing kneel-to-stand for daily prayers, and difficulty sleeping. She requires skilled PT services at this time to address relevant deficits and improve overall function.     OBJECTIVE IMPAIRMENTS: decreased activity tolerance, decreased endurance, difficulty walking, decreased strength, and pain.   ACTIVITY LIMITATIONS: bending, squatting, sleeping, bed mobility, and caring for others  PARTICIPATION LIMITATIONS: meal prep, cleaning, laundry, interpersonal relationship, driving, community activity, and occupation  PERSONAL FACTORS: Fitness, Past/current experiences, Profession, Time since onset of injury/illness/exacerbation, and 3+ comorbidities: Relevant PMHx includes Chronic LBP with Rt-sided sciatica, DM, Hep B, IBS, OA, DOE, Depression, Pes planus  are also affecting patient's functional outcome.   REHAB POTENTIAL: Fair    CLINICAL DECISION MAKING: Evolving/moderate complexity  EVALUATION COMPLEXITY: Moderate   GOALS: Goals reviewed with patient? Yes  SHORT TERM GOALS: Target date: 06/22/2023   Patient will be independent with initial home program for hip strengthening program.  Baseline: provided at  eval  Goal status: INITIAL   LONG TERM GOALS: Target date: 07/20/2023   Patient will report improved overall functional ability with FOTO score of 50 or greater.   Baseline: 29  Goal status: INITIAL  2.  Patient will report ability to perform kneel to stand with no more than 3/10 pain in  order to perform her daily prayer.  Baseline: moderate-to-severe pain  Goal status: INITIAL  3.  Patient will demonstrate ability to perform floor-to-counter lifting of at least 20# in order to improve tolerance of normal household chores.  Baseline:  Goal status: INITIAL  4.  Patient will demonstrate at least 4/5 MMT with BIL LE strength testing.  Baseline: see objective measures  Goal status: INITIAL  5.  Patient will demonstrate ability to begin regular exercise program with frequency of at least 2-3x/week without exacerbation of symptoms.  Baseline: 0x/week  Goal status: INITIAL  6.  Patient will report ability to sleep through the night without waking from pain at least 5 days/week.  Baseline: waking up daily d/t pain  Goal status: INITIAL   PLAN:  PT FREQUENCY: 1-2x/week  PT DURATION: 8 weeks  PLANNED INTERVENTIONS: 97164- PT Re-evaluation, 97110-Therapeutic exercises, 97530- Therapeutic activity, 97112- Neuromuscular re-education, 97535- Self Care, 16109- Manual therapy, Patient/Family education, Taping, Dry Needling, Joint mobilization, Joint manipulation, Spinal manipulation, Spinal mobilization, Cryotherapy, and Moist heat  PLAN FOR NEXT SESSION: hip joint mobilization, STM and TPDN as indicated, modalities as appropriate, low-impact aerobic activity for symptom modulation, LE strengthening program, updated HEP and promote independence    Berta Minor PTA 06/03/2023, 11:35 AM   For all possible CPT codes, reference the Planned Interventions line above.     Check all conditions that are expected to impact treatment: {Conditions expected to impact treatment:Diabetes mellitus and Musculoskeletal disorders   If treatment provided at initial evaluation, no treatment charged due to lack of authorization.

## 2023-06-04 ENCOUNTER — Ambulatory Visit: Payer: Medicaid Other

## 2023-06-04 DIAGNOSIS — M25552 Pain in left hip: Secondary | ICD-10-CM | POA: Diagnosis not present

## 2023-06-04 DIAGNOSIS — M6281 Muscle weakness (generalized): Secondary | ICD-10-CM

## 2023-06-04 NOTE — Therapy (Signed)
OUTPATIENT PHYSICAL THERAPY TREATMENT NOTE   Patient Name: Morgan [name] MRN: [id number] DOB:Aug 13, 1985, 37 y.o., female Today's Date: 06/04/2023  END OF SESSION:    Past Medical History:  Diagnosis Date   Back pain    Complication of anesthesia    Patient states when given the epidural the numbness went up to her nose.    Diabetes mellitus without complication (HCC)    Edema    Heartburn    Hepatitis    Hepatitis B carrier (HCC)    IBS (irritable bowel syndrome)    Osteoarthritis    Palpitations    Vitamin D deficiency    Past Surgical History:  Procedure Laterality Date   CARPAL TUNNEL RELEASE     gastric balloon  01/28/2021   ocular muscle corrective surgery     SLEEVE GASTROPLASTY  2022   Patient Active Problem List   Diagnosis Date Noted   Leukopenia 06/01/2023   Other fatigue 10/07/2022   SOBOE (shortness of breath on exertion) 10/07/2022   Depression 10/07/2022   Gastroesophageal reflux disease 10/07/2022   Depression screen 10/07/2022   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   Gastroesophageal reflux disease without esophagitis 08/26/2022   Tachycardia 07/02/2022   Pain in both feet 05/07/2021   Fatigue due to excessive exertion 05/07/2021   Carpal tunnel syndrome, bilateral 10/12/2019   Chronic low back pain with right-sided sciatica 10/12/2019   Pes planus 10/12/2019   Vitamin D deficiency 10/12/2019   Healthcare maintenance 10/12/2019   Screening for hyperlipidemia 10/12/2019   Diabetes mellitus screening 10/12/2019   Heart palpitations 10/25/2018   IBS (irritable bowel syndrome) 02/11/2014   Iron deficiency anemia 01/05/2014   History of hepatitis B 12/11/2013   Generalized obesity with starting BMI 30 12/11/2013   Hyperprolactinemia (HCC) 06/01/2013    PCP: Tiffany Kocher, DO  REFERRING PROVIDER: Nestor Ramp, MD  REFERRING DIAG: Left hip pain [M25.552]   THERAPY DIAG:  No diagnosis found.  Rationale for Evaluation and  Treatment: Rehabilitation  ONSET DATE: 3+ months   SUBJECTIVE:   SUBJECTIVE STATEMENT: Patient reporting to PT with some mild soreness after PT last night.   EVAL: Patient reports that her left hip has been hurting for a few months without known cause. She states that during the day, she only feels some resistance in the hip during the day. The pain is worst at night, especially if she rolls over to her left hip. Initially pain medicine was helpful for sleeping, but is providing little relief at this time. She has noticed that when she going from sitting on floor with legs crossed to standing up, "my gait is weird, which is coming from both hips." She also has difficulty performing kneel-to-stand and bending forward for her prayers. She does acknowledge that she has not been able to return to previous level of physical activity since having her last child who is 47 years old.   PERTINENT HISTORY: Relevant PMHx includes Chronic LBP with Rt-sided sciatica, DM, Hep B, IBS, OA, DOE, Depression, Pes planus   PAIN:  Are you having pain? Yes: NPRS scale: 9-10/10 Pain location: hip, LE  Pain description: "It feel very deep, like stabbing with a rod into my hip and radiating through my femur"  Aggravating factors: prolonged sitting, prolonged standing  Relieving factors: medicine  PRECAUTIONS: None  RED FLAGS: None   WEIGHT BEARING RESTRICTIONS: No  FALLS:  Has patient fallen in last 6 months? 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   NEXT MD VISIT: 06/08/2023 with Dr. Claudean Severance, DO  OBJECTIVE:  Note: Objective measures were completed at Evaluation unless otherwise noted.  DIAGNOSTIC FINDINGS:   05/16/23: DG Hips bilat w or w/o pelvis   IMPRESSION: 1. No acute fracture or dislocation. 2. Mild levocurvature of the  lumbar spine with left greater than right L5-S1 disc space narrowing.  PATIENT SURVEYS:  FOTO 29 current, 56 predicted  COGNITION: Overall cognitive status: Within functional limits for tasks assessed     SENSATION: WFL   POSTURE: No Significant postural limitations   LOWER EXTREMITY ROM: grossly WFL, however pain reported as below  Passive ROM Right eval Left eval  Hip flexion  P!  Hip extension    Hip abduction  P!  Hip adduction    Hip internal rotation  P!  Hip external rotation P! P!  Knee flexion    Knee extension    Ankle dorsiflexion    Ankle plantarflexion    Ankle inversion    Ankle eversion     (Blank rows = not tested)  LOWER EXTREMITY MMT:  MMT Right eval Left eval  Hip flexion 4- 3+  Hip extension 4- 3-  Hip abduction 4 4-  Hip adduction    Hip internal rotation 3- 2  Hip external rotation 3- 2  Knee flexion 4- 3+  Knee extension 4- 3+  Ankle dorsiflexion    Ankle plantarflexion    Ankle inversion    Ankle eversion     (Blank rows = not tested)  LOWER EXTREMITY SPECIAL TESTS:  LEFT Hip special tests: Luisa Hart (FABER) test: positive , Anterior hip impingement test: positive , and Piriformis test: positive   RIGHT  Hip special tests: Luisa Hart (FABER) test: positive , Anterior hip impingement test: negative, and Piriformis test: negative   GAIT: Distance walked: 50 ft Assistive device utilized: None Level of assistance: Complete Independence Comments: no significant gait deviations noted at time of evaluation    TODAY'S TREATMENT:        [name] Adult PT Treatment:                                                DATE: 06/04/23 Therapeutic Exercise: Nustep level 3 x 8 mins while gathering subjective info Bridges 2x10 Supine figure 4 stretch LLE 2x30" SLR 2x5 LLE (painful) Supine marching red TB 2 x 30" S/L hip adduction with hip IR red TB at ankles x 15 Seated hamstring curl RTB 2x10 BIL Seated hip adduction ball squeeze 5" hold  2x10 Seated LAQ 2# 2x10 BIL STS 2 x10 Sidestepping at counter x4 laps (consider adding ankle weight or resistance band next visit)   [facility] Adult PT Treatment:                                                DATE: 06/03/23 Therapeutic Exercise: Nustep level 5 x 5 mins while gathering subjective info Standing hip abduction/extension x10 BIL Sidestepping at counter x4 laps Seated hip adduction ball squeeze 5" hold 2x10 Seated hip adduction with hip IR YTB at ankles 2x10 Seated LAQ  2# 2x10 BIL Seated marching 2# x30" (painful) Seated hamstring curl RTB 2x10 BIL Bridges 2x10 Supine figure 4 stretch LLE 2x30" SLR x10 LLE (painful) STS x10                                                                                                                    [facility] Adult PT Treatment:                                                DATE: 05/25/2023  Initial evaluation: see patient education and home exercise program as noted below    PATIENT EDUCATION:  Education details: reviewed initial home exercise program; discussion of POC, prognosis and goals for skilled PT   Person educated: Patient Education method: Explanation, Demonstration, and Handouts Education comprehension: verbalized understanding, returned demonstration, and needs further education  HOME EXERCISE PROGRAM: Access Code: [email or web address] URL: [email or web address] Date: 05/25/2023 Prepared by: Mauri Reading  Exercises - Clamshell with Resistance  - 1 x daily - 3 x weekly - 2 sets - 10 reps - Supine Hip Adduction Isometric with Ball  - 1 x daily - 3 x weekly - 2 sets - 10 reps - 3-5 sec hold - Supine March with Resistance Band  - 1 x daily - 3 x weekly - 2 sets - 10 reps  ASSESSMENT:  CLINICAL IMPRESSION: Morgan [name] had improved tolerance of exercises today with some modifications as necessary. She does report some pain with SLR and hip abduction in standing. She will likely benefit from focus on strengthening within pain  free range over next few visit long with hip mobility exercises. Plan to update prescribed HEP next week.   EVAL: Morgan [name] is a 37 y.o. female  who was seen today for physical therapy evaluation and treatment for Left Hip Pain that radiates distally. She is demonstrating diminished R LE MMT scores and pain with endrange PROM of left hip. She has related pain and difficulty with ambulation after prolonged sitting, bending forward, performing kneel-to-stand for daily prayers, and difficulty sleeping. She requires skilled PT services at this time to address relevant deficits and improve overall function.     OBJECTIVE IMPAIRMENTS: decreased activity tolerance, decreased endurance, difficulty walking, decreased strength, and pain.   ACTIVITY LIMITATIONS: bending, squatting, sleeping, bed mobility, and caring for others  PARTICIPATION LIMITATIONS: meal prep, cleaning, laundry, interpersonal relationship, driving, community activity, and occupation  PERSONAL FACTORS: Fitness, Past/current experiences, Profession, Time since onset of injury/illness/exacerbation, and 3+ comorbidities: Relevant PMHx includes Chronic LBP with Rt-sided sciatica, DM, Hep B, IBS, OA, DOE, Depression, Pes planus  are also affecting patient's functional outcome.   REHAB POTENTIAL: Fair    CLINICAL DECISION MAKING: Evolving/moderate complexity  EVALUATION COMPLEXITY: Moderate   GOALS: Goals reviewed with patient? Yes  SHORT TERM GOALS: Target date: 06/22/2023   Patient will be independent with initial home program for hip  strengthening program.  Baseline: provided at eval  Goal status: INITIAL   LONG TERM GOALS: Target date: 07/20/2023   Patient will report improved overall functional ability with FOTO score of 50 or greater.   Baseline: 29  Goal status: INITIAL  2.  Patient will report ability to perform kneel to stand with no more than 3/10 pain in order to perform her daily prayer.  Baseline: moderate-to-severe  pain  Goal status: INITIAL  3.  Patient will demonstrate ability to perform floor-to-counter lifting of at least 20# in order to improve tolerance of normal household chores.  Baseline:  Goal status: INITIAL  4.  Patient will demonstrate at least 4/5 MMT with BIL LE strength testing.  Baseline: see objective measures  Goal status: INITIAL  5.  Patient will demonstrate ability to begin regular exercise program with frequency of at least 2-3x/week without exacerbation of symptoms.  Baseline: 0x/week  Goal status: INITIAL  6.  Patient will report ability to sleep through the night without waking from pain at least 5 days/week.  Baseline: waking up daily d/t pain  Goal status: INITIAL   PLAN:  PT FREQUENCY: 1-2x/week  PT DURATION: 8 weeks  PLANNED INTERVENTIONS: 97164- PT Re-evaluation, 97110-Therapeutic exercises, 97530- Therapeutic activity, 97112- Neuromuscular re-education, 97535- Self Care, 96045- Manual therapy, Patient/Family education, Taping, Dry Needling, Joint mobilization, Joint manipulation, Spinal manipulation, Spinal mobilization, Cryotherapy, and Moist heat  PLAN FOR NEXT SESSION: hip joint mobilization, STM and TPDN as indicated, modalities as appropriate, low-impact aerobic activity for symptom modulation, LE strengthening program, updated HEP and promote independence    Mauri Reading PT 06/04/2023, 8:35 AM   For all possible CPT codes, reference the Planned Interventions line above.     Check all conditions that are expected to impact treatment: {Conditions expected to impact treatment:Diabetes mellitus and Musculoskeletal disorders   If treatment provided at initial evaluation, no treatment charged due to lack of authorization.

## 2023-06-08 ENCOUNTER — Ambulatory Visit: Payer: Medicaid Other | Admitting: Student

## 2023-06-08 ENCOUNTER — Telehealth: Payer: Self-pay | Admitting: Student

## 2023-06-08 VITALS — BP 102/76 | HR 66 | Ht 60.5 in | Wt 148.4 lb

## 2023-06-08 DIAGNOSIS — M25552 Pain in left hip: Secondary | ICD-10-CM | POA: Diagnosis not present

## 2023-06-08 MED ORDER — MELOXICAM 15 MG PO TABS: 15.0000 mg | ORAL_TABLET | Freq: Every day | ORAL | 0 refills | Status: DC

## 2023-06-08 NOTE — Telephone Encounter (Signed)
Called patient, confirmed today.  After visit, reviewed chart.  History of gastric sleeve, recommend against Mobic given current GERD and history of bariatric surgery.  Recommend OTC lidocaine patch to place over hip.

## 2023-06-08 NOTE — Progress Notes (Signed)
    SUBJECTIVE:   CHIEF COMPLAINT / HPI:   Left Hip Pain Patient presents for follow-up of left hip pain. X-ray unremarkable on review. Currently in physical therapy, only 1 session per patient.  Taking OTC ibuprofen.  No improvement, nor progression of symptoms. She declines corticosteroid injection today.  PERTINENT  PMH / PSH: Bariatric surgery  OBJECTIVE:   BP 102/76   Pulse 66   Ht 5' 0.5" (1.537 m)   Wt 148 lb 6.4 oz (67.3 kg)   LMP 05/27/2023   SpO2 100%   BMI 28.51 kg/m    General: NAD, pleasant Cardio: RRR, no MRG. Cap Refill <2s. Respiratory: CTAB, normal wob on RA Left hip: No gross deformity, no ecchymosis, no swelling.  TTP over greater trochanteric, mild TTP in proximal IT band.  5/5 strength in flexion and extension, 4/5 strength with hip abduction.  Normal sensation. FADIR negative FABER negative Ober's negative  ASSESSMENT/PLAN:   Assessment & Plan Left hip pain No improvement in pain. Still suspect weak hip abductors, greater trochanter bursitis and IT band pathology. Patient declines injection today. Recommend against NSAIDs with bariatric history and GERD (see telephone note).  -OTC lidocaine patch -Continue PT - F/u 6-8 weeks -Consider injection v. Sports medicine referral if not improved.  Tiffany Kocher, DO [facility]

## 2023-06-08 NOTE — Patient Instructions (Signed)
It was great to see you! Thank you for allowing me to participate in your care!   I recommend that you always bring your medications to each appointment as this makes it easy to ensure we are on the correct medications and helps [place] not miss when refills are needed.  Our plans for today:  - Please take 1 tablet of meloxicam per day. Do not take more than 1 tablet per day. Do not take ibuprofen, aspirin, naproxen with this medication. - If you need more pain relief you can take tylenol - continue Physical Therapy - Consider getting a steroid injection if your pain is not improving  Take care and seek immediate care sooner if you develop any concerns. Please remember to show up 15 minutes before your scheduled appointment time!  Tiffany Kocher, DO [facility]

## 2023-06-09 ENCOUNTER — Ambulatory Visit: Payer: Medicaid Other

## 2023-06-09 DIAGNOSIS — M2560 Stiffness of unspecified joint, not elsewhere classified: Secondary | ICD-10-CM | POA: Diagnosis not present

## 2023-06-09 DIAGNOSIS — R531 Weakness: Secondary | ICD-10-CM | POA: Diagnosis not present

## 2023-06-09 DIAGNOSIS — M25552 Pain in left hip: Secondary | ICD-10-CM

## 2023-06-09 DIAGNOSIS — M6281 Muscle weakness (generalized): Secondary | ICD-10-CM | POA: Diagnosis not present

## 2023-06-09 DIAGNOSIS — M79641 Pain in right hand: Secondary | ICD-10-CM | POA: Diagnosis not present

## 2023-06-09 DIAGNOSIS — G5601 Carpal tunnel syndrome, right upper limb: Secondary | ICD-10-CM | POA: Diagnosis not present

## 2023-06-09 DIAGNOSIS — M25631 Stiffness of right wrist, not elsewhere classified: Secondary | ICD-10-CM | POA: Diagnosis not present

## 2023-06-09 NOTE — Therapy (Signed)
OUTPATIENT PHYSICAL THERAPY TREATMENT NOTE   Patient Name: Morgan [name] MRN: [id number] DOB:14-Jul-1985, 37 y.o., female Today's Date: 06/09/2023  END OF SESSION:  PT End of Session - 06/09/23 1002     Visit Number 4    Number of Visits 17    Date for PT Re-Evaluation 07/20/23    Authorization Type [employer]    Authorization Time Period 7 visits approved 05/31/23-07/29/23    Authorization - Visit Number 3    Authorization - Number of Visits 7    PT Start Time 1001    PT Stop Time 1039    PT Time Calculation (min) 38 min    Activity Tolerance Patient tolerated treatment well;Patient limited by pain    Behavior During Therapy WFL for tasks assessed/performed              Past Medical History:  Diagnosis Date   Back pain    Complication of anesthesia    Patient states when given the epidural the numbness went up to her nose.    Diabetes mellitus without complication (HCC)    Edema    Heartburn    Hepatitis    Hepatitis B carrier (HCC)    IBS (irritable bowel syndrome)    Osteoarthritis    Palpitations    Vitamin D deficiency    Past Surgical History:  Procedure Laterality Date   CARPAL TUNNEL RELEASE     gastric balloon  01/28/2021   ocular muscle corrective surgery     SLEEVE GASTROPLASTY  2022   Patient Active Problem List   Diagnosis Date Noted   Leukopenia 06/01/2023   Other fatigue 10/07/2022   SOBOE (shortness of breath on exertion) 10/07/2022   Depression 10/07/2022   Gastroesophageal reflux disease 10/07/2022   Depression screen 10/07/2022   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   Gastroesophageal reflux disease without esophagitis 08/26/2022   Tachycardia 07/02/2022   Pain in both feet 05/07/2021   Fatigue due to excessive exertion 05/07/2021   Carpal tunnel syndrome, bilateral 10/12/2019   Chronic low back pain with right-sided sciatica 10/12/2019   Pes planus 10/12/2019   Vitamin D deficiency 10/12/2019   Healthcare  maintenance 10/12/2019   Screening for hyperlipidemia 10/12/2019   Diabetes mellitus screening 10/12/2019   Heart palpitations 10/25/2018   IBS (irritable bowel syndrome) 02/11/2014   Iron deficiency anemia 01/05/2014   History of hepatitis B 12/11/2013   Generalized obesity with starting BMI 30 12/11/2013   Hyperprolactinemia (HCC) 06/01/2013    PCP: Tiffany Kocher, DO  REFERRING PROVIDER: Nestor Ramp, MD  REFERRING DIAG: Left hip pain [M25.552]   THERAPY DIAG:  Pain in left hip  Muscle weakness (generalized)  Rationale for Evaluation and Treatment: Rehabilitation  ONSET DATE: 3+ months   SUBJECTIVE:   SUBJECTIVE STATEMENT: Patient reports minimal current pain and that most of her pain occurs overnight, especially if she lays on the Lt side.   EVAL: Patient reports that her left hip has been hurting for a few months without known cause. She states that during the day, she only feels some resistance in the hip during the day. The pain is worst at night, especially if she rolls over to her left hip. Initially pain medicine was helpful for sleeping, but is providing little relief at this time. She has noticed that when she going from sitting on floor with legs crossed to standing up, "my gait is weird, which is coming from both hips." She also has difficulty performing  kneel-to-stand and bending forward for her prayers. She does acknowledge that she has not been able to return to previous level of physical activity since having her last child who is 14 years old.   PERTINENT HISTORY: Relevant PMHx includes Chronic LBP with Rt-sided sciatica, DM, Hep B, IBS, OA, DOE, Depression, Pes planus   PAIN:  Are you having pain? Yes: NPRS scale: 9-10/10 Pain location: hip, LE  Pain description: "It feel very deep, like stabbing with a rod into my hip and radiating through my femur"  Aggravating factors: prolonged sitting, prolonged standing  Relieving factors: medicine  PRECAUTIONS:  None  RED FLAGS: None   WEIGHT BEARING RESTRICTIONS: No  FALLS:  Has patient fallen in last 6 months? 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   NEXT MD VISIT: 06/08/2023 with Dr. Claudean Severance, DO  OBJECTIVE:  Note: Objective measures were completed at Evaluation unless otherwise noted.  DIAGNOSTIC FINDINGS:   05/16/23: DG Hips bilat w or w/o pelvis   IMPRESSION: 1. No acute fracture or dislocation. 2. Mild levocurvature of the lumbar spine with left greater than right L5-S1 disc space narrowing.  PATIENT SURVEYS:  FOTO 29 current, 56 predicted  COGNITION: Overall cognitive status: Within functional limits for tasks assessed     SENSATION: WFL   POSTURE: No Significant postural limitations   LOWER EXTREMITY ROM: grossly WFL, however pain reported as below  Passive ROM Right eval Left eval  Hip flexion  P!  Hip extension    Hip abduction  P!  Hip adduction    Hip internal rotation  P!  Hip external rotation P! P!  Knee flexion    Knee extension    Ankle dorsiflexion    Ankle plantarflexion    Ankle inversion    Ankle eversion     (Blank rows = not tested)  LOWER EXTREMITY MMT:  MMT Right eval Left eval  Hip flexion 4- 3+  Hip extension 4- 3-  Hip abduction 4 4-  Hip adduction    Hip internal rotation 3- 2  Hip external rotation 3- 2  Knee flexion 4- 3+  Knee extension 4- 3+  Ankle dorsiflexion    Ankle plantarflexion    Ankle inversion    Ankle eversion     (Blank rows = not tested)  LOWER EXTREMITY SPECIAL TESTS:  LEFT Hip special tests: Luisa Hart (FABER) test: positive , Anterior hip impingement test: positive , and Piriformis test: positive   RIGHT  Hip special tests: Luisa Hart (FABER) test: positive , Anterior hip impingement test: negative, and Piriformis test:  negative   GAIT: Distance walked: 50 ft Assistive device utilized: None Level of assistance: Complete Independence Comments: no significant gait deviations noted at time of evaluation    TODAY'S TREATMENT:     [facility] Adult PT Treatment:                                                DATE: 06/09/23 Therapeutic Exercise: Nustep level 5 x 5 mins while gathering subjective info Sidestepping at counter x3 laps RTB at ankles Standing hip extension RTB at ankles x10 BIL Bridges 2x10 Supine figure 4 stretch LLE 2x30" SLR 2x5 LLE (painful) Supine marching red TB 2  x 30" Sidelying hip abduction x10 LLE Seated hamstring curl RTB 2x10 BIL Seated hip adduction ball squeeze 5" hold 2x10 Seated FAQ with hip adduction 2# 2x10 BIL STS 2 x10 arms crossed   [facility] Adult PT Treatment:                                                DATE: 06/04/23 Therapeutic Exercise: Nustep level 3 x 8 mins while gathering subjective info Bridges 2x10 Supine figure 4 stretch LLE 2x30" SLR 2x5 LLE (painful) Supine marching red TB 2 x 30" S/L hip adduction with hip IR red TB at ankles x 15 Seated hamstring curl RTB 2x10 BIL Seated hip adduction ball squeeze 5" hold 2x10 Seated LAQ 2# 2x10 BIL STS 2 x10 Sidestepping at counter x4 laps (consider adding ankle weight or resistance band next visit)   [facility] Adult PT Treatment:                                                DATE: 06/03/23 Therapeutic Exercise: Nustep level 5 x 5 mins while gathering subjective info Standing hip abduction/extension x10 BIL Sidestepping at counter x4 laps Seated hip adduction ball squeeze 5" hold 2x10 Seated hip adduction with hip IR YTB at ankles 2x10 Seated LAQ 2# 2x10 BIL Seated marching 2# x30" (painful) Seated hamstring curl RTB 2x10 BIL Bridges 2x10 Supine figure 4 stretch LLE 2x30" SLR x10 LLE (painful) STS x10                                                                                                                      PATIENT EDUCATION:  Education details: reviewed initial home exercise program; discussion of POC, prognosis and goals for skilled PT   Person educated: Patient Education method: Explanation, Demonstration, and Handouts Education comprehension: verbalized understanding, returned demonstration, and needs further education  HOME EXERCISE PROGRAM: Access Code: [email or web address] URL: [email or web address] Date: 06/09/2023 Prepared by: Berta Minor  Exercises - Clamshell with Resistance  - 1 x daily - 3 x weekly - 2 sets - 10 reps - Supine Hip Adduction Isometric with Ball  - 1 x daily - 3 x weekly - 2 sets - 10 reps - 3-5 sec hold - Supine March with Resistance Band  - 1 x daily - 3 x weekly - 2 sets - 10 reps - Supine Active Straight Leg Raise  - 1 x daily - 7 x weekly - 10 reps - Sidelying Hip Abduction  - 1 x daily - 7 x weekly - 2 sets - 10 reps  ASSESSMENT:  CLINICAL IMPRESSION: Patient presents to PT reporting some soreness after previous session and that most of her pain continues to bother her at night, especially if she  lays on the Lt side. Updated HEP this session with patient demonstrating understanding of each exercise. Session today continued to focus on proximal hip and LE strengthening, she continues to fatigue quickly with exercises, but is able to complete. Patient continues to benefit from skilled PT services and should be progressed as able to improve functional independence.    EVAL: Haley is a 37 y.o. female  who was seen today for physical therapy evaluation and treatment for Left Hip Pain that radiates distally. She is demonstrating diminished R LE MMT scores and pain with endrange PROM of left hip. She has related pain and difficulty with ambulation after prolonged sitting, bending forward, performing kneel-to-stand for daily prayers, and difficulty sleeping. She requires skilled PT services at this time to address relevant deficits and improve overall  function.     OBJECTIVE IMPAIRMENTS: decreased activity tolerance, decreased endurance, difficulty walking, decreased strength, and pain.   ACTIVITY LIMITATIONS: bending, squatting, sleeping, bed mobility, and caring for others  PARTICIPATION LIMITATIONS: meal prep, cleaning, laundry, interpersonal relationship, driving, community activity, and occupation  PERSONAL FACTORS: Fitness, Past/current experiences, Profession, Time since onset of injury/illness/exacerbation, and 3+ comorbidities: Relevant PMHx includes Chronic LBP with Rt-sided sciatica, DM, Hep B, IBS, OA, DOE, Depression, Pes planus  are also affecting patient's functional outcome.   REHAB POTENTIAL: Fair    CLINICAL DECISION MAKING: Evolving/moderate complexity  EVALUATION COMPLEXITY: Moderate   GOALS: Goals reviewed with patient? Yes  SHORT TERM GOALS: Target date: 06/22/2023   Patient will be independent with initial home program for hip strengthening program.  Baseline: provided at eval  Goal status: INITIAL   LONG TERM GOALS: Target date: 07/20/2023   Patient will report improved overall functional ability with FOTO score of 50 or greater.   Baseline: 29  Goal status: INITIAL  2.  Patient will report ability to perform kneel to stand with no more than 3/10 pain in order to perform her daily prayer.  Baseline: moderate-to-severe pain  Goal status: INITIAL  3.  Patient will demonstrate ability to perform floor-to-counter lifting of at least 20# in order to improve tolerance of normal household chores.  Baseline:  Goal status: INITIAL  4.  Patient will demonstrate at least 4/5 MMT with BIL LE strength testing.  Baseline: see objective measures  Goal status: INITIAL  5.  Patient will demonstrate ability to begin regular exercise program with frequency of at least 2-3x/week without exacerbation of symptoms.  Baseline: 0x/week  Goal status: INITIAL  6.  Patient will report ability to sleep through the  night without waking from pain at least 5 days/week.  Baseline: waking up daily d/t pain  Goal status: INITIAL   PLAN:  PT FREQUENCY: 1-2x/week  PT DURATION: 8 weeks  PLANNED INTERVENTIONS: 97164- PT Re-evaluation, 97110-Therapeutic exercises, 97530- Therapeutic activity, 97112- Neuromuscular re-education, 97535- Self Care, 16109- Manual therapy, Patient/Family education, Taping, Dry Needling, Joint mobilization, Joint manipulation, Spinal manipulation, Spinal mobilization, Cryotherapy, and Moist heat  PLAN FOR NEXT SESSION: hip joint mobilization, STM and TPDN as indicated, modalities as appropriate, low-impact aerobic activity for symptom modulation, LE strengthening program, updated HEP and promote independence    Berta Minor PTA 06/09/2023, 10:37 AM   For all possible CPT codes, reference the Planned Interventions line above.     Check all conditions that are expected to impact treatment: {Conditions expected to impact treatment:Diabetes mellitus and Musculoskeletal disorders   If treatment provided at initial evaluation, no treatment charged due to lack of authorization.

## 2023-06-11 ENCOUNTER — Inpatient Hospital Stay: Payer: Medicaid Other

## 2023-06-11 ENCOUNTER — Ambulatory Visit: Payer: Medicaid Other

## 2023-06-11 VITALS — BP 101/74 | HR 61 | Temp 98.1°F | Resp 18

## 2023-06-11 DIAGNOSIS — M25552 Pain in left hip: Secondary | ICD-10-CM | POA: Diagnosis not present

## 2023-06-11 DIAGNOSIS — M6281 Muscle weakness (generalized): Secondary | ICD-10-CM | POA: Diagnosis not present

## 2023-06-11 DIAGNOSIS — D500 Iron deficiency anemia secondary to blood loss (chronic): Secondary | ICD-10-CM

## 2023-06-11 DIAGNOSIS — D509 Iron deficiency anemia, unspecified: Secondary | ICD-10-CM | POA: Diagnosis not present

## 2023-06-11 MED ORDER — SODIUM CHLORIDE 0.9 % IV SOLN: Freq: Once | INTRAVENOUS | Status: AC

## 2023-06-11 MED ORDER — ACETAMINOPHEN 325 MG PO TABS
650.0000 mg | ORAL_TABLET | Freq: Once | ORAL | Status: AC
  Administered 2023-06-11: 650 mg via ORAL
  Filled 2023-06-11: qty 2

## 2023-06-11 MED ORDER — SODIUM CHLORIDE 0.9 % IV SOLN
510.0000 mg | Freq: Once | INTRAVENOUS | Status: AC
  Administered 2023-06-11: 510 mg via INTRAVENOUS
  Filled 2023-06-11: qty 510

## 2023-06-11 MED ORDER — DIPHENHYDRAMINE HCL 25 MG PO CAPS
25.0000 mg | ORAL_CAPSULE | Freq: Once | ORAL | Status: AC
  Administered 2023-06-11: 25 mg via ORAL
  Filled 2023-06-11: qty 1

## 2023-06-11 NOTE — Therapy (Addendum)
OUTPATIENT PHYSICAL THERAPY TREATMENT NOTE   Patient Name: Morgan [name] MRN: [id number] DOB:1985/06/28, 37 y.o., female Today's Date: 06/11/2023  END OF SESSION:  PT End of Session - 06/11/23 0832     Visit Number 5    Number of Visits 17    Date for PT Re-Evaluation 07/20/23    Authorization Type [employer]    Authorization Time Period 7 visits approved 05/31/23-07/29/23    Authorization - Visit Number 4    Authorization - Number of Visits 7    PT Start Time [phone]    PT Stop Time 0900    PT Time Calculation (min) 26 min    Activity Tolerance Patient tolerated treatment well;Patient limited by pain    Behavior During Therapy [facility] for tasks assessed/performed               Past Medical History:  Diagnosis Date   Back pain    Complication of anesthesia    Patient states when given the epidural the numbness went up to her nose.    Diabetes mellitus without complication (HCC)    Edema    Heartburn    Hepatitis    Hepatitis B carrier (HCC)    IBS (irritable bowel syndrome)    Osteoarthritis    Palpitations    Vitamin D deficiency    Past Surgical History:  Procedure Laterality Date   CARPAL TUNNEL RELEASE     gastric balloon  01/28/2021   ocular muscle corrective surgery     SLEEVE GASTROPLASTY  2022   Patient Active Problem List   Diagnosis Date Noted   Leukopenia 06/01/2023   Other fatigue 10/07/2022   SOBOE (shortness of breath on exertion) 10/07/2022   Depression 10/07/2022   Gastroesophageal reflux disease 10/07/2022   Depression screen 10/07/2022   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   Gastroesophageal reflux disease without esophagitis 08/26/2022   Tachycardia 07/02/2022   Pain in both feet 05/07/2021   Fatigue due to excessive exertion 05/07/2021   Carpal tunnel syndrome, bilateral 10/12/2019   Chronic low back pain with right-sided sciatica 10/12/2019   Pes planus 10/12/2019   Vitamin D deficiency 10/12/2019   Healthcare  maintenance 10/12/2019   Screening for hyperlipidemia 10/12/2019   Diabetes mellitus screening 10/12/2019   Heart palpitations 10/25/2018   IBS (irritable bowel syndrome) 02/11/2014   Iron deficiency anemia 01/05/2014   History of hepatitis B 12/11/2013   Generalized obesity with starting BMI 30 12/11/2013   Hyperprolactinemia (HCC) 06/01/2013    PCP: Tiffany Kocher, DO  REFERRING PROVIDER: Nestor Ramp, MD  REFERRING DIAG: Left hip pain [M25.552]   THERAPY DIAG:  Pain in left hip  Muscle weakness (generalized)  Rationale for Evaluation and Treatment: Rehabilitation  ONSET DATE: 3+ months   SUBJECTIVE:   SUBJECTIVE STATEMENT: Patient is reporting to PT with some soreness this morning.    EVAL: Patient reports that her left hip has been hurting for a few months without known cause. She states that during the day, she only feels some resistance in the hip during the day. The pain is worst at night, especially if she rolls over to her left hip. Initially pain medicine was helpful for sleeping, but is providing little relief at this time. She has noticed that when she going from sitting on floor with legs crossed to standing up, "my gait is weird, which is coming from both hips." She also has difficulty performing kneel-to-stand and bending forward for her prayers. She does  acknowledge that she has not been able to return to previous level of physical activity since having her last child who is 93 years old.   PERTINENT HISTORY: Relevant PMHx includes Chronic LBP with Rt-sided sciatica, DM, Hep B, IBS, OA, DOE, Depression, Pes planus   PAIN:  Are you having pain? Yes: NPRS scale: 9-10/10 Pain location: hip, LE  Pain description: "It feel very deep, like stabbing with a rod into my hip and radiating through my femur"  Aggravating factors: prolonged sitting, prolonged standing  Relieving factors: medicine  PRECAUTIONS: None  RED FLAGS: None   WEIGHT BEARING RESTRICTIONS:  No  FALLS:  Has patient fallen in last 6 months? 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   NEXT MD VISIT: 06/08/2023 with Dr. Claudean Severance, DO  OBJECTIVE:  Note: Objective measures were completed at Evaluation unless otherwise noted.  DIAGNOSTIC FINDINGS:   05/16/23: DG Hips bilat w or w/o pelvis   IMPRESSION: 1. No acute fracture or dislocation. 2. Mild levocurvature of the lumbar spine with left greater than right L5-S1 disc space narrowing.  PATIENT SURVEYS:  FOTO 29 current, 56 predicted  COGNITION: Overall cognitive status: Within functional limits for tasks assessed     SENSATION: WFL   POSTURE: No Significant postural limitations   LOWER EXTREMITY ROM: grossly WFL, however pain reported as below  Passive ROM Right eval Left eval  Hip flexion  P!  Hip extension    Hip abduction  P!  Hip adduction    Hip internal rotation  P!  Hip external rotation P! P!  Knee flexion    Knee extension    Ankle dorsiflexion    Ankle plantarflexion    Ankle inversion    Ankle eversion     (Blank rows = not tested)  LOWER EXTREMITY MMT:  MMT Right eval Left eval  Hip flexion 4- 3+  Hip extension 4- 3-  Hip abduction 4 4-  Hip adduction    Hip internal rotation 3- 2  Hip external rotation 3- 2  Knee flexion 4- 3+  Knee extension 4- 3+  Ankle dorsiflexion    Ankle plantarflexion    Ankle inversion    Ankle eversion     (Blank rows = not tested)  LOWER EXTREMITY SPECIAL TESTS:  LEFT Hip special tests: Luisa Hart (FABER) test: positive , Anterior hip impingement test: positive , and Piriformis test: positive   RIGHT  Hip special tests: Luisa Hart (FABER) test: positive , Anterior hip impingement test: negative, and Piriformis test: negative   GAIT: Distance walked: 50 ft Assistive device utilized:  None Level of assistance: Complete Independence Comments: no significant gait deviations noted at time of evaluation    TODAY'S TREATMENT:       [facility] Adult PT Treatment:                                                DATE: 06/11/23 Therapeutic Exercise: Nustep level 5 x 5 mins while gathering subjective info Sidestepping at counter x3 laps RTB at ankles Standing hip extension RTB at ankles 2x10 BIL Bridges 2x10 SLR 3x5 each Supine marching green TB 2 x 10 (each) Seated hip adduction p-ring  2x10 Seated FAQ with hip adduction  2# 2x10 BIL STS 2 x10 arms crossed  [facility] Adult PT Treatment:                                                DATE: 06/09/23 Therapeutic Exercise: Nustep level 5 x 5 mins while gathering subjective info Sidestepping at counter x3 laps RTB at ankles Standing hip extension RTB at ankles x10 BIL Bridges 2x10 Supine figure 4 stretch LLE 2x30" SLR 2x5 LLE (painful) Supine marching red TB 2 x 30" Sidelying hip abduction x10 LLE Seated hamstring curl RTB 2x10 BIL Seated hip adduction ball squeeze 5" hold 2x10 Seated FAQ with hip adduction 2# 2x10 BIL STS 2 x10 arms crossed   [facility] Adult PT Treatment:                                                DATE: 06/04/23 Therapeutic Exercise: Nustep level 3 x 8 mins while gathering subjective info Bridges 2x10 Supine figure 4 stretch LLE 2x30" SLR 2x5 LLE (painful) Supine marching red TB 2 x 30" S/L hip adduction with hip IR red TB at ankles x 15 Seated hamstring curl RTB 2x10 BIL Seated hip adduction ball squeeze 5" hold 2x10 Seated LAQ 2# 2x10 BIL STS 2 x10 Sidestepping at counter x4 laps (consider adding ankle weight or resistance band next visit)                                                                                                                      PATIENT EDUCATION:  Education details: reviewed initial home exercise program; discussion of POC, prognosis and goals for skilled PT   Person  educated: Patient Education method: Explanation, Demonstration, and Handouts Education comprehension: verbalized understanding, returned demonstration, and needs further education  HOME EXERCISE PROGRAM: Access Code: [email or web address] URL: [email or web address] Date: 06/09/2023 Prepared by: Berta Minor  Exercises - Clamshell with Resistance  - 1 x daily - 3 x weekly - 2 sets - 10 reps - Supine Hip Adduction Isometric with Ball  - 1 x daily - 3 x weekly - 2 sets - 10 reps - 3-5 sec hold - Supine March with Resistance Band  - 1 x daily - 3 x weekly - 2 sets - 10 reps - Supine Active Straight Leg Raise  - 1 x daily - 7 x weekly - 10 reps - Sidelying Hip Abduction  - 1 x daily - 7 x weekly - 2 sets - 10 reps  ASSESSMENT:  CLINICAL IMPRESSION: Patient was limited by some soreness this morning, so we decreased volume of exercises today. She was able to tolerate some progression of resistance and repetitions with activities performed. We we continue to progress resistance as tolerated.    EVAL: Morgan [name]  is a 37 y.o. female  who was seen today for physical therapy evaluation and treatment for Left Hip Pain that radiates distally. She is demonstrating diminished R LE MMT scores and pain with endrange PROM of left hip. She has related pain and difficulty with ambulation after prolonged sitting, bending forward, performing kneel-to-stand for daily prayers, and difficulty sleeping. She requires skilled PT services at this time to address relevant deficits and improve overall function.     OBJECTIVE IMPAIRMENTS: decreased activity tolerance, decreased endurance, difficulty walking, decreased strength, and pain.   ACTIVITY LIMITATIONS: bending, squatting, sleeping, bed mobility, and caring for others  PARTICIPATION LIMITATIONS: meal prep, cleaning, laundry, interpersonal relationship, driving, community activity, and occupation  PERSONAL FACTORS: Fitness, Past/current experiences,  Profession, Time since onset of injury/illness/exacerbation, and 3+ comorbidities: Relevant PMHx includes Chronic LBP with Rt-sided sciatica, DM, Hep B, IBS, OA, DOE, Depression, Pes planus  are also affecting patient's functional outcome.   REHAB POTENTIAL: Fair    CLINICAL DECISION MAKING: Evolving/moderate complexity  EVALUATION COMPLEXITY: Moderate   GOALS: Goals reviewed with patient? Yes  SHORT TERM GOALS: Target date: 06/22/2023   Patient will be independent with initial home program for hip strengthening program.  Baseline: provided at eval  Goal status: INITIAL   LONG TERM GOALS: Target date: 07/20/2023   Patient will report improved overall functional ability with FOTO score of 50 or greater.   Baseline: 29  Goal status: INITIAL  2.  Patient will report ability to perform kneel to stand with no more than 3/10 pain in order to perform her daily prayer.  Baseline: moderate-to-severe pain  Goal status: INITIAL  3.  Patient will demonstrate ability to perform floor-to-counter lifting of at least 20# in order to improve tolerance of normal household chores.  Baseline:  Goal status: INITIAL  4.  Patient will demonstrate at least 4/5 MMT with BIL LE strength testing.  Baseline: see objective measures  Goal status: INITIAL  5.  Patient will demonstrate ability to begin regular exercise program with frequency of at least 2-3x/week without exacerbation of symptoms.  Baseline: 0x/week  Goal status: INITIAL  6.  Patient will report ability to sleep through the night without waking from pain at least 5 days/week.  Baseline: waking up daily d/t pain  Goal status: INITIAL   PLAN:  PT FREQUENCY: 1-2x/week  PT DURATION: 8 weeks  PLANNED INTERVENTIONS: 97164- PT Re-evaluation, 97110-Therapeutic exercises, 97530- Therapeutic activity, 97112- Neuromuscular re-education, 97535- Self Care, 95284- Manual therapy, Patient/Family education, Taping, Dry Needling, Joint  mobilization, Joint manipulation, Spinal manipulation, Spinal mobilization, Cryotherapy, and Moist heat  PLAN FOR NEXT SESSION: hip joint mobilization, STM and TPDN as indicated, modalities as appropriate, low-impact aerobic activity for symptom modulation, LE strengthening program, updated HEP and promote independence    Mauri Reading, PT, DPT   06/11/2023, 9:06 AM   For all possible CPT codes, reference the Planned Interventions line above.     Check all conditions that are expected to impact treatment: {Conditions expected to impact treatment:Diabetes mellitus and Musculoskeletal disorders   If treatment provided at initial evaluation, no treatment charged due to lack of authorization.

## 2023-06-11 NOTE — Patient Instructions (Signed)
 Ferumoxytol Injection What is this medication? FERUMOXYTOL (FER ue MOX i tol) treats low levels of iron in your body (iron deficiency anemia). Iron is a mineral that plays an important role in making red blood cells, which carry oxygen from your lungs to the rest of your body. This medicine may be used for other purposes; ask your health care provider or pharmacist if you have questions. COMMON BRAND NAME(S): Feraheme What should I tell my care team before I take this medication? They need to know if you have any of these conditions: Anemia not caused by low iron levels High levels of iron in the blood Magnetic resonance imaging (MRI) test scheduled An unusual or allergic reaction to iron, other medications, foods, dyes, or preservatives Pregnant or trying to get pregnant Breastfeeding How should I use this medication? This medication is injected into a vein. It is given by your care team in a hospital or clinic setting. Talk to your care team the use of this medication in children. Special care may be needed. Overdosage: If you think you have taken too much of this medicine contact a poison control center or emergency room at once. NOTE: This medicine is only for you. Do not share this medicine with others. What if I miss a dose? It is important not to miss your dose. Call your care team if you are unable to keep an appointment. What may interact with this medication? Other iron products This list may not describe all possible interactions. Give your health care provider a list of all the medicines, herbs, non-prescription drugs, or dietary supplements you use. Also tell them if you smoke, drink alcohol, or use illegal drugs. Some items may interact with your medicine. What should I watch for while using this medication? Visit your care team regularly. Tell your care team if your symptoms do not start to get better or if they get worse. You may need blood work done while you are taking this  medication. You may need to follow a special diet. Talk to your care team. Foods that contain iron include: whole grains/cereals, dried fruits, beans, or peas, leafy green vegetables, and organ meats (liver, kidney). What side effects may I notice from receiving this medication? Side effects that you should report to your care team as soon as possible: Allergic reactions--skin rash, itching, hives, swelling of the face, lips, tongue, or throat Low blood pressure--dizziness, feeling faint or lightheaded, blurry vision Shortness of breath Side effects that usually do not require medical attention (report to your care team if they continue or are bothersome): Flushing Headache Joint pain Muscle pain Nausea Pain, redness, or irritation at injection site This list may not describe all possible side effects. Call your doctor for medical advice about side effects. You may report side effects to FDA at 1-800-FDA-1088. Where should I keep my medication? This medication is given in a hospital or clinic. It will not be stored at home. NOTE: This sheet is a summary. It may not cover all possible information. If you have questions about this medicine, talk to your doctor, pharmacist, or health care provider.  2024 Elsevier/Gold Standard (2022-11-13 00:00:00)

## 2023-06-14 ENCOUNTER — Ambulatory Visit: Payer: Medicaid Other

## 2023-06-14 DIAGNOSIS — M25552 Pain in left hip: Secondary | ICD-10-CM

## 2023-06-14 DIAGNOSIS — G5601 Carpal tunnel syndrome, right upper limb: Secondary | ICD-10-CM | POA: Diagnosis not present

## 2023-06-14 DIAGNOSIS — R531 Weakness: Secondary | ICD-10-CM | POA: Diagnosis not present

## 2023-06-14 DIAGNOSIS — M6281 Muscle weakness (generalized): Secondary | ICD-10-CM | POA: Diagnosis not present

## 2023-06-14 DIAGNOSIS — M79641 Pain in right hand: Secondary | ICD-10-CM | POA: Diagnosis not present

## 2023-06-14 DIAGNOSIS — M25631 Stiffness of right wrist, not elsewhere classified: Secondary | ICD-10-CM | POA: Diagnosis not present

## 2023-06-14 DIAGNOSIS — M2560 Stiffness of unspecified joint, not elsewhere classified: Secondary | ICD-10-CM | POA: Diagnosis not present

## 2023-06-14 NOTE — Therapy (Signed)
OUTPATIENT PHYSICAL THERAPY TREATMENT NOTE   Patient Name: Morgan [name] MRN: [id number] DOB:09/17/1985, 37 y.o., female Today's Date: 06/14/2023  END OF SESSION:  PT End of Session - 06/14/23 0915     Visit Number 6    Number of Visits 17    Date for PT Re-Evaluation 07/20/23    Authorization Type [employer]    Authorization Time Period 7 visits approved 05/31/23-07/29/23    Authorization - Visit Number 5    Authorization - Number of Visits 7    PT Start Time 0832    PT Stop Time 0910    PT Time Calculation (min) 38 min    Activity Tolerance Patient tolerated treatment well;Patient limited by pain    Behavior During Therapy [facility] for tasks assessed/performed                Past Medical History:  Diagnosis Date   Back pain    Complication of anesthesia    Patient states when given the epidural the numbness went up to her nose.    Diabetes mellitus without complication (HCC)    Edema    Heartburn    Hepatitis    Hepatitis B carrier (HCC)    IBS (irritable bowel syndrome)    Osteoarthritis    Palpitations    Vitamin D deficiency    Past Surgical History:  Procedure Laterality Date   CARPAL TUNNEL RELEASE     gastric balloon  01/28/2021   ocular muscle corrective surgery     SLEEVE GASTROPLASTY  2022   Patient Active Problem List   Diagnosis Date Noted   Leukopenia 06/01/2023   Other fatigue 10/07/2022   SOBOE (shortness of breath on exertion) 10/07/2022   Depression 10/07/2022   Gastroesophageal reflux disease 10/07/2022   Depression screen 10/07/2022   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   Gastroesophageal reflux disease without esophagitis 08/26/2022   Tachycardia 07/02/2022   Pain in both feet 05/07/2021   Fatigue due to excessive exertion 05/07/2021   Carpal tunnel syndrome, bilateral 10/12/2019   Chronic low back pain with right-sided sciatica 10/12/2019   Pes planus 10/12/2019   Vitamin D deficiency 10/12/2019   Healthcare  maintenance 10/12/2019   Screening for hyperlipidemia 10/12/2019   Diabetes mellitus screening 10/12/2019   Heart palpitations 10/25/2018   IBS (irritable bowel syndrome) 02/11/2014   Iron deficiency anemia 01/05/2014   History of hepatitis B 12/11/2013   Generalized obesity with starting BMI 30 12/11/2013   Hyperprolactinemia (HCC) 06/01/2013    PCP: Tiffany Kocher, DO  REFERRING PROVIDER: Nestor Ramp, MD  REFERRING DIAG: Left hip pain [M25.552]   THERAPY DIAG:  Pain in left hip  Muscle weakness (generalized)  Rationale for Evaluation and Treatment: Rehabilitation  ONSET DATE: 3+ months   SUBJECTIVE:   SUBJECTIVE STATEMENT: Patient reports that last night was one of the worst nights she had d/t pain, and was not able to sleep well.    PERTINENT HISTORY: Relevant PMHx includes Chronic LBP with Rt-sided sciatica, DM, Hep B, IBS, OA, DOE, Depression, Pes planus   PAIN:  Are you having pain? Yes: NPRS scale: 9-10/10 Pain location: hip, LE  Pain description: "It feel very deep, like stabbing with a rod into my hip and radiating through my femur"  Aggravating factors: prolonged sitting, prolonged standing  Relieving factors: medicine  PRECAUTIONS: None  RED FLAGS: None   WEIGHT BEARING RESTRICTIONS: No  FALLS:  Has patient fallen in last 6 months? 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   NEXT MD VISIT: 06/08/2023 with Dr. Claudean Severance, DO  OBJECTIVE:  Note: Objective measures were completed at Evaluation unless otherwise noted.  DIAGNOSTIC FINDINGS:   05/16/23: DG Hips bilat w or w/o pelvis   IMPRESSION: 1. No acute fracture or dislocation. 2. Mild levocurvature of the lumbar spine with left greater than right L5-S1 disc space narrowing.  PATIENT SURVEYS:  FOTO 29 current, 56  predicted  COGNITION: Overall cognitive status: Within functional limits for tasks assessed     SENSATION: WFL   POSTURE: No Significant postural limitations   LOWER EXTREMITY ROM: grossly WFL, however pain reported as below  Passive ROM Right eval Left eval  Hip flexion  P!  Hip extension    Hip abduction  P!  Hip adduction    Hip internal rotation  P!  Hip external rotation P! P!  Knee flexion    Knee extension    Ankle dorsiflexion    Ankle plantarflexion    Ankle inversion    Ankle eversion     (Blank rows = not tested)  LOWER EXTREMITY MMT:  MMT Right eval Left eval  Hip flexion 4- 3+  Hip extension 4- 3-  Hip abduction 4 4-  Hip adduction    Hip internal rotation 3- 2  Hip external rotation 3- 2  Knee flexion 4- 3+  Knee extension 4- 3+  Ankle dorsiflexion    Ankle plantarflexion    Ankle inversion    Ankle eversion     (Blank rows = not tested)  LOWER EXTREMITY SPECIAL TESTS:  LEFT Hip special tests: Luisa Hart (FABER) test: positive , Anterior hip impingement test: positive , and Piriformis test: positive   RIGHT  Hip special tests: Luisa Hart (FABER) test: positive , Anterior hip impingement test: negative, and Piriformis test: negative   GAIT: Distance walked: 50 ft Assistive device utilized: None Level of assistance: Complete Independence Comments: no significant gait deviations noted at time of evaluation    TODAY'S TREATMENT:      [facility] Adult PT Treatment:                                                DATE: 06/14/23  Therapeutic Exercise: Nustep level 5 x 5 mins while gathering subjective info DKTC with pball x 20  LTR with pball x 20  S/L LEFT Clamshells, 2 x 10 S/L LEFT hip abduction, 2 x 10  Supine glute bridges with ball squeeze, 2 x 10  SLR 2 x 10 each  Manual Therapy  Lateral hip mobilization with belt  Lateral MWM with glute bridges  Unable to tolerate STM    [facility] Adult PT Treatment:                                                 DATE: 06/11/23 Therapeutic Exercise: Nustep level 5 x 5 mins while gathering subjective info Sidestepping at counter x3 laps RTB at ankles Standing hip extension RTB at ankles 2x10 BIL Bridges 2x10 SLR 3x5 each Supine marching green TB 2 x 10 (each) Seated  hip adduction p-ring  2x10 Seated FAQ with hip adduction 2# 2x10 BIL STS 2 x10 arms crossed  [facility] Adult PT Treatment:                                                DATE: 06/09/23 Therapeutic Exercise: Nustep level 5 x 5 mins while gathering subjective info Sidestepping at counter x3 laps RTB at ankles Standing hip extension RTB at ankles x10 BIL Bridges 2x10 Supine figure 4 stretch LLE 2x30" SLR 2x5 LLE (painful) Supine marching red TB 2 x 30" Sidelying hip abduction x10 LLE Seated hamstring curl RTB 2x10 BIL Seated hip adduction ball squeeze 5" hold 2x10 Seated FAQ with hip adduction 2# 2x10 BIL STS 2 x10 arms crossed   [facility] Adult PT Treatment:                                                DATE: 06/04/23 Therapeutic Exercise: Nustep level 3 x 8 mins while gathering subjective info Bridges 2x10 Supine figure 4 stretch LLE 2x30" SLR 2x5 LLE (painful) Supine marching red TB 2 x 30" S/L hip adduction with hip IR red TB at ankles x 15 Seated hamstring curl RTB 2x10 BIL Seated hip adduction ball squeeze 5" hold 2x10 Seated LAQ 2# 2x10 BIL STS 2 x10 Sidestepping at counter x4 laps (consider adding ankle weight or resistance band next visit)                                                                                                                      PATIENT EDUCATION:  Education details: reviewed initial home exercise program; discussion of POC, prognosis and goals for skilled PT   Person educated: Patient Education method: Explanation, Demonstration, and Handouts Education comprehension: verbalized understanding, returned demonstration, and needs further education  HOME EXERCISE PROGRAM: Access Code:  [email or web address] URL: [email or web address] Date: 06/09/2023 Prepared by: Berta Minor  Exercises - Clamshell with Resistance  - 1 x daily - 3 x weekly - 2 sets - 10 reps - Supine Hip Adduction Isometric with Ball  - 1 x daily - 3 x weekly - 2 sets - 10 reps - 3-5 sec hold - Supine March with Resistance Band  - 1 x daily - 3 x weekly - 2 sets - 10 reps - Supine Active Straight Leg Raise  - 1 x daily - 7 x weekly - 10 reps - Sidelying Hip Abduction  - 1 x daily - 7 x weekly - 2 sets - 10 reps  ASSESSMENT:  CLINICAL IMPRESSION: Today's session had increased focus on hip mobility activities following NuStep at start of session. She has moderate-to-severe tenderness to palpation around Left greater trochanter, that is worse just superiorly and posteriorly.  She was able to perform some LE strengthening exercises; however, we did decrease the volume today d/t patient tolerance.      EVAL: Corrisa is a 37 y.o. female  who was seen today for physical therapy evaluation and treatment for Left Hip Pain that radiates distally. She is demonstrating diminished R LE MMT scores and pain with endrange PROM of left hip. She has related pain and difficulty with ambulation after prolonged sitting, bending forward, performing kneel-to-stand for daily prayers, and difficulty sleeping. She requires skilled PT services at this time to address relevant deficits and improve overall function.     OBJECTIVE IMPAIRMENTS: decreased activity tolerance, decreased endurance, difficulty walking, decreased strength, and pain.   ACTIVITY LIMITATIONS: bending, squatting, sleeping, bed mobility, and caring for others  PARTICIPATION LIMITATIONS: meal prep, cleaning, laundry, interpersonal relationship, driving, community activity, and occupation  PERSONAL FACTORS: Fitness, Past/current experiences, Profession, Time since onset of injury/illness/exacerbation, and 3+ comorbidities: Relevant PMHx includes Chronic LBP  with Rt-sided sciatica, DM, Hep B, IBS, OA, DOE, Depression, Pes planus  are also affecting patient's functional outcome.   REHAB POTENTIAL: Fair    CLINICAL DECISION MAKING: Evolving/moderate complexity  EVALUATION COMPLEXITY: Moderate   GOALS: Goals reviewed with patient? Yes  SHORT TERM GOALS: Target date: 06/22/2023   Patient will be independent with initial home program for hip strengthening program.  Baseline: provided at eval  Goal status: INITIAL   LONG TERM GOALS: Target date: 07/20/2023   Patient will report improved overall functional ability with FOTO score of 50 or greater.   Baseline: 29  Goal status: INITIAL  2.  Patient will report ability to perform kneel to stand with no more than 3/10 pain in order to perform her daily prayer.  Baseline: moderate-to-severe pain  Goal status: INITIAL  3.  Patient will demonstrate ability to perform floor-to-counter lifting of at least 20# in order to improve tolerance of normal household chores.  Baseline:  Goal status: INITIAL  4.  Patient will demonstrate at least 4/5 MMT with BIL LE strength testing.  Baseline: see objective measures  Goal status: INITIAL  5.  Patient will demonstrate ability to begin regular exercise program with frequency of at least 2-3x/week without exacerbation of symptoms.  Baseline: 0x/week  Goal status: INITIAL  6.  Patient will report ability to sleep through the night without waking from pain at least 5 days/week.  Baseline: waking up daily d/t pain  Goal status: INITIAL   PLAN:  PT FREQUENCY: 1-2x/week  PT DURATION: 8 weeks  PLANNED INTERVENTIONS: 97164- PT Re-evaluation, 97110-Therapeutic exercises, 97530- Therapeutic activity, 97112- Neuromuscular re-education, 97535- Self Care, 16109- Manual therapy, Patient/Family education, Taping, Dry Needling, Joint mobilization, Joint manipulation, Spinal manipulation, Spinal mobilization, Cryotherapy, and Moist heat  PLAN FOR NEXT  SESSION: hip joint mobilization, STM and TPDN as indicated, modalities as appropriate, low-impact aerobic activity for symptom modulation, LE strengthening program, updated HEP and promote independence    Mauri Reading, PT, DPT   06/14/2023, 9:16 AM   For all possible CPT codes, reference the Planned Interventions line above.     Check all conditions that are expected to impact treatment: {Conditions expected to impact treatment:Diabetes mellitus and Musculoskeletal disorders   If treatment provided at initial evaluation, no treatment charged due to lack of authorization.

## 2023-06-17 ENCOUNTER — Ambulatory Visit: Payer: Medicaid Other | Admitting: Physical Therapy

## 2023-06-17 ENCOUNTER — Encounter: Payer: Self-pay | Admitting: Physical Therapy

## 2023-06-17 DIAGNOSIS — M25552 Pain in left hip: Secondary | ICD-10-CM | POA: Diagnosis not present

## 2023-06-17 DIAGNOSIS — M6281 Muscle weakness (generalized): Secondary | ICD-10-CM | POA: Diagnosis not present

## 2023-06-17 NOTE — Patient Instructions (Signed)

## 2023-06-17 NOTE — Therapy (Signed)
OUTPATIENT PHYSICAL THERAPY TREATMENT NOTE   Patient Name: Morgan [name] MRN: [id number] DOB:05/22/1986, 37 y.o., female Today's Date: 06/17/2023  END OF SESSION:  PT End of Session - 06/17/23 1020     Visit Number 7    Number of Visits 17    Date for PT Re-Evaluation 07/20/23    Authorization Type [employer]    Authorization Time Period 7 visits approved 05/31/23-07/29/23    Authorization - Number of Visits 7    PT Start Time 1020    PT Stop Time 1100    PT Time Calculation (min) 40 min    Activity Tolerance Patient tolerated treatment well;Patient limited by pain    Behavior During Therapy WFL for tasks assessed/performed                 Past Medical History:  Diagnosis Date   Back pain    Complication of anesthesia    Patient states when given the epidural the numbness went up to her nose.    Diabetes mellitus without complication (HCC)    Edema    Heartburn    Hepatitis    Hepatitis B carrier (HCC)    IBS (irritable bowel syndrome)    Osteoarthritis    Palpitations    Vitamin D deficiency    Past Surgical History:  Procedure Laterality Date   CARPAL TUNNEL RELEASE     gastric balloon  01/28/2021   ocular muscle corrective surgery     SLEEVE GASTROPLASTY  2022   Patient Active Problem List   Diagnosis Date Noted   Leukopenia 06/01/2023   Other fatigue 10/07/2022   SOBOE (shortness of breath on exertion) 10/07/2022   Depression 10/07/2022   Gastroesophageal reflux disease 10/07/2022   Depression screen 10/07/2022   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   Gastroesophageal reflux disease without esophagitis 08/26/2022   Tachycardia 07/02/2022   Pain in both feet 05/07/2021   Fatigue due to excessive exertion 05/07/2021   Carpal tunnel syndrome, bilateral 10/12/2019   Chronic low back pain with right-sided sciatica 10/12/2019   Pes planus 10/12/2019   Vitamin D deficiency 10/12/2019   Healthcare maintenance 10/12/2019   Screening  for hyperlipidemia 10/12/2019   Diabetes mellitus screening 10/12/2019   Heart palpitations 10/25/2018   IBS (irritable bowel syndrome) 02/11/2014   Iron deficiency anemia 01/05/2014   History of hepatitis B 12/11/2013   Generalized obesity with starting BMI 30 12/11/2013   Hyperprolactinemia (HCC) 06/01/2013    PCP: Tiffany Kocher, DO  REFERRING PROVIDER: Nestor Ramp, MD  REFERRING DIAG: Left hip pain [M25.552]   THERAPY DIAG:  Pain in left hip  Muscle weakness (generalized)  Rationale for Evaluation and Treatment: Rehabilitation  ONSET DATE: 3+ months   SUBJECTIVE:   SUBJECTIVE STATEMENT: Pain in on my hip and it makes me feel like my joint is out of place. I can feel like a bursitis. When I am sleeping and I turn on my left side and it hurts and I have to turn back to right.  When I get up from the floor, I feel like I don't walk correctly. When I rise from (tailor sitting) 5/10 pain in left hip.  PERTINENT HISTORY: Relevant PMHx includes Chronic LBP with Rt-sided sciatica, DM, Hep B, IBS, OA, DOE, Depression, Pes planus   PAIN:  Are you having pain? Yes: NPRS scale: 9-10/10 Pain location: hip, LE  Pain description: "It feel very deep, like stabbing with a rod into my hip and radiating  through my femur"  Aggravating factors: prolonged sitting, prolonged standing  Relieving factors: medicine  PRECAUTIONS: None  RED FLAGS: None   WEIGHT BEARING RESTRICTIONS: No  FALLS:  Has patient fallen in last 6 months? 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   NEXT MD VISIT: 06/08/2023 with Dr. Claudean Severance, DO  OBJECTIVE:  Note: Objective measures were completed at Evaluation unless otherwise noted.  DIAGNOSTIC FINDINGS:   05/16/23: DG Hips bilat w or w/o pelvis   IMPRESSION: 1. No acute fracture or  dislocation. 2. Mild levocurvature of the lumbar spine with left greater than right L5-S1 disc space narrowing.  PATIENT SURVEYS:  FOTO 29 current, 56 predicted  COGNITION: Overall cognitive status: Within functional limits for tasks assessed     SENSATION: WFL   POSTURE: No Significant postural limitations   LOWER EXTREMITY ROM: grossly WFL, however pain reported as below  Passive ROM Right eval Left eval  Hip flexion  P!  Hip extension    Hip abduction  P!  Hip adduction    Hip internal rotation  P!  Hip external rotation P! P!  Knee flexion    Knee extension    Ankle dorsiflexion    Ankle plantarflexion    Ankle inversion    Ankle eversion     (Blank rows = not tested)  LOWER EXTREMITY MMT:  MMT Right eval Left eval  Hip flexion 4- 3+  Hip extension 4- 3-  Hip abduction 4 4-  Hip adduction    Hip internal rotation 3- 2  Hip external rotation 3- 2  Knee flexion 4- 3+  Knee extension 4- 3+  Ankle dorsiflexion    Ankle plantarflexion    Ankle inversion    Ankle eversion     (Blank rows = not tested)  LOWER EXTREMITY SPECIAL TESTS:  LEFT Hip special tests: Luisa Hart (FABER) test: positive , Anterior hip impingement test: positive , and Piriformis test: positive   RIGHT  Hip special tests: Luisa Hart (FABER) test: positive , Anterior hip impingement test: negative, and Piriformis test: negative   GAIT: Distance walked: 50 ft Assistive device utilized: None Level of assistance: Complete Independence Comments: no significant gait deviations noted at time of evaluation    TODAY'S TREATMENT:   [facility] Adult PT Treatment:                                                DATE: 06-17-23 Therapeutic Exercise: DKTC with black pball x 20  LTR with black pball x 20  SLR 2 x 10 each Supine glute bridges with ball squeeze, 2 x 10  S/L LEFT Clamshells, 2 x 10 S/L LEFT hip abduction, 2 x 10   Manual Therapy: STW of left gluteals LAD of Left LE Trigger Point  Dry-Needling  Treatment instructions: Expect mild to moderate muscle soreness. S/S of pneumothorax if dry needled over a lung field, and to seek immediate medical attention should they occur. Patient verbalized understanding of these instructions and education.  Patient Consent Given: Yes Education handout provided: Yes Muscles treated: Left gluteals, Left piriformis Electrical stimulation performed: Yes Parameters:  25 pps and increasing intensity to pt tolerance every 2 minute for 8 minutes Treatment response/outcome: I feel sore  where needles were but no sharp pains   Modalities: Moist heat pack on left gluteals  [facility] Adult PT Treatment:                                                DATE: 06/14/23  Therapeutic Exercise: Nustep level 5 x 5 mins while gathering subjective info DKTC with pball x 20  LTR with pball x 20  S/L LEFT Clamshells, 2 x 10 S/L LEFT hip abduction, 2 x 10  Supine glute bridges with ball squeeze, 2 x 10  SLR 2 x 10 each  Manual Therapy  Lateral hip mobilization with belt  Lateral MWM with glute bridges  Unable to tolerate STM    [facility] Adult PT Treatment:                                                DATE: 06/11/23 Therapeutic Exercise: Nustep level 5 x 5 mins while gathering subjective info Sidestepping at counter x3 laps RTB at ankles Standing hip extension RTB at ankles 2x10 BIL Bridges 2x10 SLR 3x5 each Supine marching green TB 2 x 10 (each) Seated hip adduction p-ring  2x10 Seated FAQ with hip adduction 2# 2x10 BIL STS 2 x10 arms crossed  [facility] Adult PT Treatment:                                                DATE: 06/09/23 Therapeutic Exercise: Nustep level 5 x 5 mins while gathering subjective info Sidestepping at counter x3 laps RTB at ankles Standing hip extension RTB at ankles x10 BIL Bridges 2x10 Supine figure 4 stretch LLE 2x30" SLR 2x5 LLE (painful) Supine marching red TB 2 x 30" Sidelying hip abduction x10 LLE Seated hamstring  curl RTB 2x10 BIL Seated hip adduction ball squeeze 5" hold 2x10 Seated FAQ with hip adduction 2# 2x10 BIL STS 2 x10 arms crossed   [facility] Adult PT Treatment:                                                DATE: 06/04/23 Therapeutic Exercise: Nustep level 3 x 8 mins while gathering subjective info Bridges 2x10 Supine figure 4 stretch LLE 2x30" SLR 2x5 LLE (painful) Supine marching red TB 2 x 30" S/L hip adduction with hip IR red TB at ankles x 15 Seated hamstring curl RTB 2x10 BIL Seated hip adduction ball squeeze 5" hold 2x10 Seated LAQ 2# 2x10 BIL STS 2 x10 Sidestepping at counter x4 laps (consider adding ankle weight or resistance band next visit)  PATIENT EDUCATION:  Education details: reviewed initial home exercise program; discussion of POC, prognosis and goals for skilled PT   Person educated: Patient Education method: Explanation, Demonstration, and Handouts Education comprehension: verbalized understanding, returned demonstration, and needs further education  HOME EXERCISE PROGRAM: Access Code: [email or web address] URL: [email or web address] Date: 06/09/2023 Prepared by: Berta Minor  Exercises - Clamshell with Resistance  - 1 x daily - 3 x weekly - 2 sets - 10 reps - Supine Hip Adduction Isometric with Ball  - 1 x daily - 3 x weekly - 2 sets - 10 reps - 3-5 sec hold - Supine March with Resistance Band  - 1 x daily - 3 x weekly - 2 sets - 10 reps - Supine Active Straight Leg Raise  - 1 x daily - 7 x weekly - 10 reps - Sidelying Hip Abduction  - 1 x daily - 7 x weekly - 2 sets - 10 reps  ASSESSMENT:  CLINICAL IMPRESSION: Pt consented to TPDN with estim today. Pt was monitored throughout session. Pt with two point estim over gluteals/piriformis  Pt pain 5/10 and was able to complete all exercises today without issue. Today exercise reinforced  exercises for hip mobility from last session post TPDN. She has moderate-to-severe tenderness to palpation around Left greater trochanter, that is worse just superiorly > posteriorly. She was able to use RTB resistance for hip abduction clams today. Pt sharp pain reduced to sore post RX.     EVAL: Morgan [name] is a 37 y.o. female  who was seen today for physical therapy evaluation and treatment for Left Hip Pain that radiates distally. She is demonstrating diminished R LE MMT scores and pain with endrange PROM of left hip. She has related pain and difficulty with ambulation after prolonged sitting, bending forward, performing kneel-to-stand for daily prayers, and difficulty sleeping. She requires skilled PT services at this time to address relevant deficits and improve overall function.     OBJECTIVE IMPAIRMENTS: decreased activity tolerance, decreased endurance, difficulty walking, decreased strength, and pain.   ACTIVITY LIMITATIONS: bending, squatting, sleeping, bed mobility, and caring for others  PARTICIPATION LIMITATIONS: meal prep, cleaning, laundry, interpersonal relationship, driving, community activity, and occupation  PERSONAL FACTORS: Fitness, Past/current experiences, Profession, Time since onset of injury/illness/exacerbation, and 3+ comorbidities: Relevant PMHx includes Chronic LBP with Rt-sided sciatica, DM, Hep B, IBS, OA, DOE, Depression, Pes planus  are also affecting patient's functional outcome.   REHAB POTENTIAL: Fair    CLINICAL DECISION MAKING: Evolving/moderate complexity  EVALUATION COMPLEXITY: Moderate   GOALS: Goals reviewed with patient? Yes  SHORT TERM GOALS: Target date: 06/22/2023   Patient will be independent with initial home program for hip strengthening program.  Baseline: provided at eval  Goal status: INITIAL   LONG TERM GOALS: Target date: 07/20/2023   Patient will report improved overall functional ability with FOTO score of 50 or greater.    Baseline: 29  Goal status: INITIAL  2.  Patient will report ability to perform kneel to stand with no more than 3/10 pain in order to perform her daily prayer.  Baseline: moderate-to-severe pain  Goal status: INITIAL  3.  Patient will demonstrate ability to perform floor-to-counter lifting of at least 20# in order to improve tolerance of normal household chores.  Baseline:  Goal status: INITIAL  4.  Patient will demonstrate at least 4/5 MMT with BIL LE strength testing.  Baseline: see objective measures  Goal status: INITIAL  5.  Patient will demonstrate ability to begin regular exercise program  with frequency of at least 2-3x/week without exacerbation of symptoms.  Baseline: 0x/week  Goal status: INITIAL  6.  Patient will report ability to sleep through the night without waking from pain at least 5 days/week.  Baseline: waking up daily d/t pain  Goal status: INITIAL   PLAN:  PT FREQUENCY: 1-2x/week  PT DURATION: 8 weeks  PLANNED INTERVENTIONS: 97164- PT Re-evaluation, 97110-Therapeutic exercises, 97530- Therapeutic activity, 97112- Neuromuscular re-education, 97535- Self Care, 82956- Manual therapy, Patient/Family education, Taping, Dry Needling, Joint mobilization, Joint manipulation, Spinal manipulation, Spinal mobilization, Cryotherapy, and Moist heat  PLAN FOR NEXT SESSION: hip joint mobilization, STM and TPDN as indicated, modalities as appropriate, low-impact aerobic activity for symptom modulation, LE strengthening program, updated HEP and promote independence    Garen Lah, PT, [facility] Certified Exercise Expert for the Aging Adult  06/17/23 11:10 AM Phone: [phone] Fax: [phone]   06/17/2023, 11:10 AM   For all possible CPT codes, reference the Planned Interventions line above.     Check all conditions that are expected to impact treatment: {Conditions expected to impact treatment:Diabetes mellitus and Musculoskeletal disorders   If treatment  provided at initial evaluation, no treatment charged due to lack of authorization.

## 2023-06-19 ENCOUNTER — Other Ambulatory Visit: Payer: Self-pay | Admitting: Student

## 2023-06-19 DIAGNOSIS — M25552 Pain in left hip: Secondary | ICD-10-CM

## 2023-06-22 ENCOUNTER — Ambulatory Visit: Payer: Medicaid Other

## 2023-06-22 DIAGNOSIS — M25552 Pain in left hip: Secondary | ICD-10-CM | POA: Diagnosis not present

## 2023-06-22 DIAGNOSIS — M6281 Muscle weakness (generalized): Secondary | ICD-10-CM | POA: Diagnosis not present

## 2023-06-22 NOTE — Therapy (Signed)
 OUTPATIENT PHYSICAL THERAPY TREATMENT NOTE   Patient Name: Morgan [name] MRN: [id number] DOB:Apr 22, 1986, 37 y.o., female Today's Date: 06/22/2023  END OF SESSION:  PT End of Session - 06/22/23 1130     Visit Number 8    Number of Visits 17    Date for PT Re-Evaluation 07/20/23    Authorization Type [employer]    Authorization Time Period 7 visits approved 05/31/23-07/29/23    Authorization - Visit Number 7    Authorization - Number of Visits 7    PT Start Time 1130    PT Stop Time 1210    PT Time Calculation (min) 40 min    Activity Tolerance Patient tolerated treatment well;Patient limited by pain    Behavior During Therapy [facility] for tasks assessed/performed             Past Medical History:  Diagnosis Date   Back pain    Complication of anesthesia    Patient states when given the epidural the numbness went up to her nose.    Diabetes mellitus without complication (HCC)    Edema    Heartburn    Hepatitis    Hepatitis B carrier (HCC)    IBS (irritable bowel syndrome)    Osteoarthritis    Palpitations    Vitamin D  deficiency    Past Surgical History:  Procedure Laterality Date   CARPAL TUNNEL RELEASE     gastric balloon  01/28/2021   ocular muscle corrective surgery     SLEEVE GASTROPLASTY  2022   Patient Active Problem List   Diagnosis Date Noted   Leukopenia 06/01/2023   Other fatigue 10/07/2022   SOBOE (shortness of breath on exertion) 10/07/2022   Depression 10/07/2022   Gastroesophageal reflux disease 10/07/2022   Depression screen 10/07/2022   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   Gastroesophageal reflux disease without esophagitis 08/26/2022   Tachycardia 07/02/2022   Pain in both feet 05/07/2021   Fatigue due to excessive exertion 05/07/2021   Carpal tunnel syndrome, bilateral 10/12/2019   Chronic low back pain with right-sided sciatica 10/12/2019   Pes planus 10/12/2019   Vitamin D  deficiency 10/12/2019   Healthcare  maintenance 10/12/2019   Screening for hyperlipidemia 10/12/2019   Diabetes mellitus screening 10/12/2019   Heart palpitations 10/25/2018   IBS (irritable bowel syndrome) 02/11/2014   Iron deficiency anemia 01/05/2014   History of hepatitis B 12/11/2013   Generalized obesity with starting BMI 30 12/11/2013   Hyperprolactinemia (HCC) 06/01/2013    PCP: Howell Lunger, DO  REFERRING PROVIDER: Rosalynn Camie CROME, MD  REFERRING DIAG: Left hip pain [M25.552]   THERAPY DIAG:  Pain in left hip  Muscle weakness (generalized)  Rationale for Evaluation and Treatment: Rehabilitation  ONSET DATE: 3+ months   SUBJECTIVE:   SUBJECTIVE STATEMENT: Patient reports that she was very sore after the TPDN with stim from last session for several days and that even her Rt hip started hurting.   PERTINENT HISTORY: Relevant PMHx includes Chronic LBP with Rt-sided sciatica, DM, Hep B, IBS, OA, DOE, Depression, Pes planus   PAIN:  Are you having pain? Yes: NPRS scale: 9-10/10 Pain location: hip, LE  Pain description: It feel very deep, like stabbing with a rod into my hip and radiating through my femur  Aggravating factors: prolonged sitting, prolonged standing  Relieving factors: medicine  PRECAUTIONS: None  RED FLAGS: None   WEIGHT BEARING RESTRICTIONS: No  FALLS:  Has patient fallen in last 6 months? 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   NEXT MD VISIT: 06/08/2023 with Dr. Howell, DO  OBJECTIVE:  Note: Objective measures were completed at Evaluation unless otherwise noted.  DIAGNOSTIC FINDINGS:   05/16/23: DG Hips bilat w or w/o pelvis   IMPRESSION: 1. No acute fracture or dislocation. 2. Mild levocurvature of the lumbar spine with left greater than right L5-S1 disc space narrowing.  PATIENT SURVEYS:   FOTO 29 current, 56 predicted  COGNITION: Overall cognitive status: Within functional limits for tasks assessed     SENSATION: WFL   POSTURE: No Significant postural limitations   LOWER EXTREMITY ROM: grossly WFL, however pain reported as below  Passive ROM Right eval Left eval  Hip flexion  P!  Hip extension    Hip abduction  P!  Hip adduction    Hip internal rotation  P!  Hip external rotation P! P!  Knee flexion    Knee extension    Ankle dorsiflexion    Ankle plantarflexion    Ankle inversion    Ankle eversion     (Blank rows = not tested)  LOWER EXTREMITY MMT:  MMT Right eval Left eval  Hip flexion 4- 3+  Hip extension 4- 3-  Hip abduction 4 4-  Hip adduction    Hip internal rotation 3- 2  Hip external rotation 3- 2  Knee flexion 4- 3+  Knee extension 4- 3+  Ankle dorsiflexion    Ankle plantarflexion    Ankle inversion    Ankle eversion     (Blank rows = not tested)  LOWER EXTREMITY SPECIAL TESTS:  LEFT Hip special tests: Belvie (FABER) test: positive , Anterior hip impingement test: positive , and Piriformis test: positive   RIGHT  Hip special tests: Belvie (FABER) test: positive , Anterior hip impingement test: negative, and Piriformis test: negative   GAIT: Distance walked: 50 ft Assistive device utilized: None Level of assistance: Complete Independence Comments: no significant gait deviations noted at time of evaluation    TODAY'S TREATMENT:   [facility] Adult PT Treatment:                                                DATE: 06/22/23 Therapeutic Exercise: Nustep level 5 x 5 mins while gathering subjective info Seated hip adduction p-ring 3 hold 2x10 Seated FAQ with hip adduction 2.5# 2x10 BIL DKTC with pball x 20  LTR with pball x 20  S/L LEFT Clamshells, 2 x 10 S/L LEFT hip abduction, 2 x 10 Supine marching green TB 2 x 10 (each)  Supine glute bridges with GTB around knees 2 x 10  SLR 2 x 10 each   [facility] Adult PT Treatment:                                                 DATE: 06-17-23 Therapeutic Exercise: DKTC with black pball x 20  LTR with black pball x 20  SLR 2 x 10 each Supine glute bridges with ball squeeze, 2 x 10  S/L LEFT Clamshells, 2 x 10 S/L LEFT hip abduction, 2 x  10   Manual Therapy: STW of left gluteals LAD of Left LE Trigger Point Dry-Needling  Treatment instructions: Expect mild to moderate muscle soreness. S/S of pneumothorax if dry needled over a lung field, and to seek immediate medical attention should they occur. Patient verbalized understanding of these instructions and education.  Patient Consent Given: Yes Education handout provided: Yes Muscles treated: Left gluteals, Left piriformis Electrical stimulation performed: Yes Parameters:  25 pps and increasing intensity to pt tolerance every 2 minute for 8 minutes Treatment response/outcome: I feel sore where needles were but no sharp pains   Modalities: Moist heat pack on left gluteals  [facility] Adult PT Treatment:                                                DATE: 06/14/23  Therapeutic Exercise: Nustep level 5 x 5 mins while gathering subjective info DKTC with pball x 20  LTR with pball x 20  S/L LEFT Clamshells, 2 x 10 S/L LEFT hip abduction, 2 x 10  Supine glute bridges with ball squeeze, 2 x 10  SLR 2 x 10 each  Manual Therapy  Lateral hip mobilization with belt  Lateral MWM with glute bridges  Unable to tolerate STM    PATIENT EDUCATION:  Education details: reviewed initial home exercise program; discussion of POC, prognosis and goals for skilled PT   Person educated: Patient Education method: Explanation, Demonstration, and Handouts Education comprehension: verbalized understanding, returned demonstration, and needs further education  HOME EXERCISE PROGRAM: Access Code: [email or web address] URL: [email or web address] Date: 06/09/2023 Prepared by: Corean Pouch  Exercises - Clamshell with Resistance  - 1  x daily - 3 x weekly - 2 sets - 10 reps - Supine Hip Adduction Isometric with Ball  - 1 x daily - 3 x weekly - 2 sets - 10 reps - 3-5 sec hold - Supine March with Resistance Band  - 1 x daily - 3 x weekly - 2 sets - 10 reps - Supine Active Straight Leg Raise  - 1 x daily - 7 x weekly - 10 reps - Sidelying Hip Abduction  - 1 x daily - 7 x weekly - 2 sets - 10 reps  ASSESSMENT:  CLINICAL IMPRESSION: Patient presents to PT reporting increased soreness after TPDN with stim last session and that her Rt hip also started to hurt. She states that it feels better today, but was sore all weekend. Session today focused on proximal hip strengthening and mobility. Patient was able to tolerate all prescribed exercises with no adverse effects. Patient continues to benefit from skilled PT services and should be progressed as able to improve functional independence.    EVAL: Cathlene is a 37 y.o. female  who was seen today for physical therapy evaluation and treatment for Left Hip Pain that radiates distally. She is demonstrating diminished R LE MMT scores and pain with endrange PROM of left hip. She has related pain and difficulty with ambulation after prolonged sitting, bending forward, performing kneel-to-stand for daily prayers, and difficulty sleeping. She requires skilled PT services at this time to address relevant deficits and improve overall function.     OBJECTIVE IMPAIRMENTS: decreased activity tolerance, decreased endurance, difficulty walking, decreased strength, and pain.   ACTIVITY LIMITATIONS: bending, squatting, sleeping, bed mobility, and caring for others  PARTICIPATION LIMITATIONS: meal prep, cleaning, laundry, interpersonal relationship, driving, community  activity, and occupation  PERSONAL FACTORS: Fitness, Past/current experiences, Profession, Time since onset of injury/illness/exacerbation, and 3+ comorbidities: Relevant PMHx includes Chronic LBP with Rt-sided sciatica, DM, Hep B, IBS, OA,  DOE, Depression, Pes planus  are also affecting patient's functional outcome.   REHAB POTENTIAL: Fair    CLINICAL DECISION MAKING: Evolving/moderate complexity  EVALUATION COMPLEXITY: Moderate   GOALS: Goals reviewed with patient? Yes  SHORT TERM GOALS: Target date: 06/22/2023   Patient will be independent with initial home program for hip strengthening program.  Baseline: provided at eval  Goal status: MET Pt reports adherence 06/22/23   LONG TERM GOALS: Target date: 07/20/2023   Patient will report improved overall functional ability with FOTO score of 50 or greater.   Baseline: 29  Goal status: Ongoing  2.  Patient will report ability to perform kneel to stand with no more than 3/10 pain in order to perform her daily prayer.  Baseline: moderate-to-severe pain  Goal status: Ongoing  3.  Patient will demonstrate ability to perform floor-to-counter lifting of at least 20# in order to improve tolerance of normal household chores.  Baseline:  Goal status: Ongoing  4.  Patient will demonstrate at least 4/5 MMT with BIL LE strength testing.  Baseline: see objective measures  Goal status: Ongoing  5.  Patient will demonstrate ability to begin regular exercise program with frequency of at least 2-3x/week without exacerbation of symptoms.  Baseline: 0x/week  Goal status: Ongoing  6.  Patient will report ability to sleep through the night without waking from pain at least 5 days/week.  Baseline: waking up daily d/t pain  Goal status: Ongoing   PLAN:  PT FREQUENCY: 1-2x/week  PT DURATION: 8 weeks  PLANNED INTERVENTIONS: 97164- PT Re-evaluation, 97110-Therapeutic exercises, 97530- Therapeutic activity, 97112- Neuromuscular re-education, 97535- Self Care, 02859- Manual therapy, Patient/Family education, Taping, Dry Needling, Joint mobilization, Joint manipulation, Spinal manipulation, Spinal mobilization, Cryotherapy, and Moist heat  PLAN FOR NEXT SESSION: hip joint  mobilization, STM and TPDN as indicated, modalities as appropriate, low-impact aerobic activity for symptom modulation, LE strengthening program, updated HEP and promote independence    Corean Pouch PTA  06/22/23 12:11 PM Phone: [phone] Fax: [phone]

## 2023-06-24 ENCOUNTER — Ambulatory Visit: Payer: Medicaid Other | Attending: Family Medicine

## 2023-06-24 DIAGNOSIS — M6281 Muscle weakness (generalized): Secondary | ICD-10-CM | POA: Insufficient documentation

## 2023-06-24 DIAGNOSIS — M25552 Pain in left hip: Secondary | ICD-10-CM | POA: Insufficient documentation

## 2023-06-24 NOTE — Therapy (Signed)
 OUTPATIENT PHYSICAL THERAPY TREATMENT NOTE   Patient Name: Morgan [name] MRN: [id number] DOB:[birth year], 38 y.o., female Today's Date: 06/24/2023  END OF SESSION:  PT End of Session - 06/24/23 0917     Visit Number 9    Number of Visits 17    Date for PT Re-Evaluation 07/20/23    Authorization Type [employer]    Authorization Time Period Awaiting additional shara    PT Start Time 0917    PT Stop Time 0955    PT Time Calculation (min) 38 min    Activity Tolerance Patient tolerated treatment well;Patient limited by pain    Behavior During Therapy [facility] for tasks assessed/performed             Past Medical History:  Diagnosis Date   Back pain    Complication of anesthesia    Patient states when given the epidural the numbness went up to her nose.    Diabetes mellitus without complication (HCC)    Edema    Heartburn    Hepatitis    Hepatitis B carrier (HCC)    IBS (irritable bowel syndrome)    Osteoarthritis    Palpitations    Vitamin D  deficiency    Past Surgical History:  Procedure Laterality Date   CARPAL TUNNEL RELEASE     gastric balloon  01/28/2021   ocular muscle corrective surgery     SLEEVE GASTROPLASTY  2022   Patient Active Problem List   Diagnosis Date Noted   Leukopenia 06/01/2023   Other fatigue 10/07/2022   SOBOE (shortness of breath on exertion) 10/07/2022   Depression 10/07/2022   Gastroesophageal reflux disease 10/07/2022   Depression screen 10/07/2022   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   Gastroesophageal reflux disease without esophagitis 08/26/2022   Tachycardia 07/02/2022   Pain in both feet 05/07/2021   Fatigue due to excessive exertion 05/07/2021   Carpal tunnel syndrome, bilateral 10/12/2019   Chronic low back pain with right-sided sciatica 10/12/2019   Pes planus 10/12/2019   Vitamin D  deficiency 10/12/2019   Healthcare maintenance 10/12/2019   Screening for hyperlipidemia 10/12/2019   Diabetes mellitus  screening 10/12/2019   Heart palpitations 10/25/2018   IBS (irritable bowel syndrome) 02/11/2014   Iron deficiency anemia 01/05/2014   History of hepatitis B 12/11/2013   Generalized obesity with starting BMI 30 12/11/2013   Hyperprolactinemia (HCC) 06/01/2013    PCP: Howell Lunger, DO  REFERRING PROVIDER: Rosalynn Camie CROME, MD  REFERRING DIAG: Left hip pain [M25.552]   THERAPY DIAG:  Pain in left hip  Muscle weakness (generalized)  Rationale for Evaluation and Treatment: Rehabilitation  ONSET DATE: 3+ months   SUBJECTIVE:   SUBJECTIVE STATEMENT: Patient reports some muscle soreness from previous session that has since improved.   PERTINENT HISTORY: Relevant PMHx includes Chronic LBP with Rt-sided sciatica, DM, Hep B, IBS, OA, DOE, Depression, Pes planus   PAIN:  Are you having pain? Yes: NPRS scale: 9-10/10 Pain location: hip, LE  Pain description: It feel very deep, like stabbing with a rod into my hip and radiating through my femur  Aggravating factors: prolonged sitting, prolonged standing  Relieving factors: medicine  PRECAUTIONS: None  RED FLAGS: None   WEIGHT BEARING RESTRICTIONS: No  FALLS:  Has patient fallen in last 6 months? 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  NEXT MD VISIT: 06/08/2023 with Dr. Howell, DO  OBJECTIVE:  Note: Objective measures were completed at Evaluation unless otherwise noted.  DIAGNOSTIC FINDINGS:   05/16/23: DG Hips bilat w or w/o pelvis   IMPRESSION: 1. No acute fracture or dislocation. 2. Mild levocurvature of the lumbar spine with left greater than right L5-S1 disc space narrowing.  PATIENT SURVEYS:  FOTO 29 current, 56 predicted  COGNITION: Overall cognitive status: Within functional limits for tasks  assessed     SENSATION: WFL   POSTURE: No Significant postural limitations   LOWER EXTREMITY ROM: grossly WFL, however pain reported as below  Passive ROM Right eval Left eval  Hip flexion  P!  Hip extension    Hip abduction  P!  Hip adduction    Hip internal rotation  P!  Hip external rotation P! P!  Knee flexion    Knee extension    Ankle dorsiflexion    Ankle plantarflexion    Ankle inversion    Ankle eversion     (Blank rows = not tested)  LOWER EXTREMITY MMT:  MMT Right eval Left eval  Hip flexion 4- 3+  Hip extension 4- 3-  Hip abduction 4 4-  Hip adduction    Hip internal rotation 3- 2  Hip external rotation 3- 2  Knee flexion 4- 3+  Knee extension 4- 3+  Ankle dorsiflexion    Ankle plantarflexion    Ankle inversion    Ankle eversion     (Blank rows = not tested)  LOWER EXTREMITY SPECIAL TESTS:  LEFT Hip special tests: Belvie (FABER) test: positive , Anterior hip impingement test: positive , and Piriformis test: positive   RIGHT  Hip special tests: Belvie (FABER) test: positive , Anterior hip impingement test: negative, and Piriformis test: negative   GAIT: Distance walked: 50 ft Assistive device utilized: None Level of assistance: Complete Independence Comments: no significant gait deviations noted at time of evaluation    TODAY'S TREATMENT:   [facility] Adult PT Treatment:                                                DATE: 06/24/23 Therapeutic Exercise: Nustep level 5 x 6 mins while gathering subjective info Seated hip adduction p-ring 3 hold 2x10 Seated FAQ with hip adduction 2.5# 2x10 BIL Seated hamstring curl GTB 2x10 BIL DKTC with pball x 25  LTR with pball x 20  S/L LEFT Clamshells, 2 x 10 S/L LEFT hip abduction, 2 x 10 Supine marching green TB 2 x 10 (each)  Supine glute bridges with GTB around knees 2 x 10  SLR 2 x 10 each   [facility] Adult PT Treatment:                                                DATE: 06/22/23 Therapeutic  Exercise: Nustep level 5 x 5 mins while gathering subjective info Seated hip adduction p-ring 3 hold 2x10 Seated FAQ with hip adduction 2.5# 2x10 BIL DKTC with pball x 20  LTR with pball x 20  S/L LEFT Clamshells, 2 x 10 S/L LEFT hip abduction, 2 x 10 Supine marching green TB 2 x 10 (each)  Supine glute bridges with GTB around knees 2  x 10  SLR 2 x 10 each   [facility] Adult PT Treatment:                                                DATE: 06-17-23 Therapeutic Exercise: DKTC with black pball x 20  LTR with black pball x 20  SLR 2 x 10 each Supine glute bridges with ball squeeze, 2 x 10  S/L LEFT Clamshells, 2 x 10 S/L LEFT hip abduction, 2 x 10   Manual Therapy: STW of left gluteals LAD of Left LE Trigger Point Dry-Needling  Treatment instructions: Expect mild to moderate muscle soreness. S/S of pneumothorax if dry needled over a lung field, and to seek immediate medical attention should they occur. Patient verbalized understanding of these instructions and education.  Patient Consent Given: Yes Education handout provided: Yes Muscles treated: Left gluteals, Left piriformis Electrical stimulation performed: Yes Parameters:  25 pps and increasing intensity to pt tolerance every 2 minute for 8 minutes Treatment response/outcome: I feel sore where needles were but no sharp pains   Modalities: Moist heat pack on left gluteals    PATIENT EDUCATION:  Education details: reviewed initial home exercise program; discussion of POC, prognosis and goals for skilled PT   Person educated: Patient Education method: Explanation, Demonstration, and Handouts Education comprehension: verbalized understanding, returned demonstration, and needs further education  HOME EXERCISE PROGRAM: Access Code: [email or web address] URL: [email or web address] Date: 06/09/2023 Prepared by: Corean Pouch  Exercises - Clamshell with Resistance  - 1 x daily - 3 x weekly - 2 sets - 10 reps - Supine  Hip Adduction Isometric with Ball  - 1 x daily - 3 x weekly - 2 sets - 10 reps - 3-5 sec hold - Supine March with Resistance Band  - 1 x daily - 3 x weekly - 2 sets - 10 reps - Supine Active Straight Leg Raise  - 1 x daily - 7 x weekly - 10 reps - Sidelying Hip Abduction  - 1 x daily - 7 x weekly - 2 sets - 10 reps  ASSESSMENT:  CLINICAL IMPRESSION: Patient presents to PT reporting minimal current pain and that she only had some soreness after previous session that has since decreased. Session today continued to focus on proximal hip strengthening and hip mobility. Patient was able to tolerate all prescribed exercises with no adverse effects. Patient continues to benefit from skilled PT services and should be progressed as able to improve functional independence.    EVAL: Nguyen is a 38 y.o. female  who was seen today for physical therapy evaluation and treatment for Left Hip Pain that radiates distally. She is demonstrating diminished R LE MMT scores and pain with endrange PROM of left hip. She has related pain and difficulty with ambulation after prolonged sitting, bending forward, performing kneel-to-stand for daily prayers, and difficulty sleeping. She requires skilled PT services at this time to address relevant deficits and improve overall function.     OBJECTIVE IMPAIRMENTS: decreased activity tolerance, decreased endurance, difficulty walking, decreased strength, and pain.   ACTIVITY LIMITATIONS: bending, squatting, sleeping, bed mobility, and caring for others  PARTICIPATION LIMITATIONS: meal prep, cleaning, laundry, interpersonal relationship, driving, community activity, and occupation  PERSONAL FACTORS: Fitness, Past/current experiences, Profession, Time since onset of injury/illness/exacerbation, and 3+ comorbidities: Relevant PMHx includes Chronic LBP with Rt-sided sciatica, DM, Hep B, IBS, OA,  DOE, Depression, Pes planus  are also affecting patient's functional outcome.   REHAB  POTENTIAL: Fair    CLINICAL DECISION MAKING: Evolving/moderate complexity  EVALUATION COMPLEXITY: Moderate   GOALS: Goals reviewed with patient? Yes  SHORT TERM GOALS: Target date: 06/22/2023   Patient will be independent with initial home program for hip strengthening program.  Baseline: provided at eval  Goal status: MET Pt reports adherence 06/22/23   LONG TERM GOALS: Target date: 07/20/2023   Patient will report improved overall functional ability with FOTO score of 50 or greater.   Baseline: 29  Goal status: Ongoing  2.  Patient will report ability to perform kneel to stand with no more than 3/10 pain in order to perform her daily prayer.  Baseline: moderate-to-severe pain  Goal status: Ongoing  3.  Patient will demonstrate ability to perform floor-to-counter lifting of at least 20# in order to improve tolerance of normal household chores.  Baseline:  Goal status: Ongoing  4.  Patient will demonstrate at least 4/5 MMT with BIL LE strength testing.  Baseline: see objective measures  Goal status: Ongoing  5.  Patient will demonstrate ability to begin regular exercise program with frequency of at least 2-3x/week without exacerbation of symptoms.  Baseline: 0x/week  Goal status: Ongoing  6.  Patient will report ability to sleep through the night without waking from pain at least 5 days/week.  Baseline: waking up daily d/t pain  Goal status: Ongoing   PLAN:  PT FREQUENCY: 1-2x/week  PT DURATION: 8 weeks  PLANNED INTERVENTIONS: 97164- PT Re-evaluation, 97110-Therapeutic exercises, 97530- Therapeutic activity, 97112- Neuromuscular re-education, 97535- Self Care, 02859- Manual therapy, Patient/Family education, Taping, Dry Needling, Joint mobilization, Joint manipulation, Spinal manipulation, Spinal mobilization, Cryotherapy, and Moist heat  PLAN FOR NEXT SESSION: hip joint mobilization, STM and TPDN as indicated, modalities as appropriate, low-impact aerobic  activity for symptom modulation, LE strengthening program, updated HEP and promote independence    Corean Pouch PTA  06/24/23 9:58 AM Phone: [phone] Fax: [phone]

## 2023-06-27 ENCOUNTER — Other Ambulatory Visit: Payer: Self-pay | Admitting: Family Medicine

## 2023-06-27 DIAGNOSIS — F3289 Other specified depressive episodes: Secondary | ICD-10-CM

## 2023-06-28 ENCOUNTER — Telehealth: Payer: Self-pay

## 2023-06-28 DIAGNOSIS — G5601 Carpal tunnel syndrome, right upper limb: Secondary | ICD-10-CM | POA: Diagnosis not present

## 2023-06-28 NOTE — Telephone Encounter (Signed)
 Patient contacted for mychart message apt request.   Comments: Epigastric pain with nausea and heartburn and better taste Very bad cough sometimes I can't say full sentence On/off wheeze.  Patient reports ~ 2 weeks ago she was feeling under the weather. She reports low grade fever and cough. She reports the fever subsided, however the cough has continued to linger. She reports she finds herself short of breath at times.   She was speaking in full sentences on during our conversation.   Patient scheduled for tomorrow for evaluation.   Precautions discussed.

## 2023-06-29 ENCOUNTER — Emergency Department (HOSPITAL_COMMUNITY)
Admission: EM | Admit: 2023-06-29 | Discharge: 2023-06-30 | Disposition: A | Discharge status: Home / Self Care | Payer: Medicaid Other | Attending: Emergency Medicine | Admitting: Emergency Medicine

## 2023-06-29 ENCOUNTER — Ambulatory Visit: Payer: Medicaid Other | Admitting: Student

## 2023-06-29 ENCOUNTER — Other Ambulatory Visit: Payer: Self-pay

## 2023-06-29 ENCOUNTER — Encounter (HOSPITAL_COMMUNITY): Payer: Self-pay | Admitting: Emergency Medicine

## 2023-06-29 VITALS — BP 109/78 | HR 112 | Wt 138.4 lb

## 2023-06-29 DIAGNOSIS — J181 Lobar pneumonia, unspecified organism: Secondary | ICD-10-CM | POA: Insufficient documentation

## 2023-06-29 DIAGNOSIS — R112 Nausea with vomiting, unspecified: Secondary | ICD-10-CM | POA: Diagnosis present

## 2023-06-29 DIAGNOSIS — K219 Gastro-esophageal reflux disease without esophagitis: Secondary | ICD-10-CM

## 2023-06-29 DIAGNOSIS — R161 Splenomegaly, not elsewhere classified: Secondary | ICD-10-CM | POA: Diagnosis not present

## 2023-06-29 DIAGNOSIS — R000 Tachycardia, unspecified: Secondary | ICD-10-CM | POA: Diagnosis not present

## 2023-06-29 DIAGNOSIS — J189 Pneumonia, unspecified organism: Secondary | ICD-10-CM

## 2023-06-29 DIAGNOSIS — R059 Cough, unspecified: Secondary | ICD-10-CM | POA: Diagnosis not present

## 2023-06-29 DIAGNOSIS — R0602 Shortness of breath: Secondary | ICD-10-CM | POA: Diagnosis not present

## 2023-06-29 DIAGNOSIS — R591 Generalized enlarged lymph nodes: Secondary | ICD-10-CM

## 2023-06-29 DIAGNOSIS — R1013 Epigastric pain: Secondary | ICD-10-CM | POA: Diagnosis not present

## 2023-06-29 DIAGNOSIS — Z20822 Contact with and (suspected) exposure to covid-19: Secondary | ICD-10-CM | POA: Diagnosis not present

## 2023-06-29 DIAGNOSIS — R079 Chest pain, unspecified: Secondary | ICD-10-CM | POA: Diagnosis not present

## 2023-06-29 DIAGNOSIS — R918 Other nonspecific abnormal finding of lung field: Secondary | ICD-10-CM | POA: Diagnosis not present

## 2023-06-29 LAB — CBC
HCT: 33.8 % — ABNORMAL LOW (ref 36.0–46.0)
Hemoglobin: 10.4 g/dL — ABNORMAL LOW (ref 12.0–15.0)
MCH: 24.3 pg — ABNORMAL LOW (ref 26.0–34.0)
MCHC: 30.8 g/dL (ref 30.0–36.0)
MCV: 79 fL — ABNORMAL LOW (ref 80.0–100.0)
Platelets: 463 10*3/uL — ABNORMAL HIGH (ref 150–400)
RBC: 4.28 MIL/uL (ref 3.87–5.11)
RDW: 14.4 % (ref 11.5–15.5)
WBC: 8.2 10*3/uL (ref 4.0–10.5)
nRBC: 0 % (ref 0.0–0.2)

## 2023-06-29 LAB — COMPREHENSIVE METABOLIC PANEL
ALT: 13 U/L (ref 0–44)
AST: 15 U/L (ref 15–41)
Albumin: 3.2 g/dL — ABNORMAL LOW (ref 3.5–5.0)
Alkaline Phosphatase: 94 U/L (ref 38–126)
Anion gap: 12 (ref 5–15)
BUN: 11 mg/dL (ref 6–20)
CO2: 24 mmol/L (ref 22–32)
Calcium: 9.3 mg/dL (ref 8.9–10.3)
Chloride: 102 mmol/L (ref 98–111)
Creatinine, Ser: 0.72 mg/dL (ref 0.44–1.00)
GFR, Estimated: >60 mL/min (ref >60)
Glucose, Bld: 94 mg/dL (ref 70–99)
Potassium: 4 mmol/L (ref 3.5–5.1)
Sodium: 138 mmol/L (ref 135–145)
Total Bilirubin: 1.5 mg/dL — ABNORMAL HIGH (ref 0.0–1.2)
Total Protein: 7.2 g/dL (ref 6.5–8.1)

## 2023-06-29 LAB — LIPASE, BLOOD: Lipase: 23 U/L (ref 11–51)

## 2023-06-29 LAB — HCG, SERUM, QUALITATIVE: Preg, Serum: NEGATIVE

## 2023-06-29 MED ORDER — IBUPROFEN 400 MG PO TABS: 600.0000 mg | ORAL_TABLET | Freq: Once | ORAL | Status: DC

## 2023-06-29 NOTE — Assessment & Plan Note (Addendum)
 Uncontrolled, complicated with decreased urination secondary to lack of intake.  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  She presents with tachycardia as well prompting concern for unsafe discharge.  Recommend evaluation in ED, at the very least blood work assessing worsening anemia and kidney injury.  She will need sooner evaluation by GI than she currently has scheduled.

## 2023-06-29 NOTE — ED Triage Notes (Signed)
 Pt complains epigastric pain with nausea and vomiting x 4 days. Went to UC and advised to come to ER for further evaluation. Denies diarrhea.

## 2023-06-29 NOTE — ED Notes (Signed)
 Pt given specimen up, unable to void at this time.  KM

## 2023-06-29 NOTE — Progress Notes (Signed)
  SUBJECTIVE:   CHIEF COMPLAINT / HPI:   Cough: Reports approximately 2 weeks of symptoms which include a low-grade fever (Tmax 102 only lasted 1-2 days), cough which was worsened overtime and most recently some epigastric pain with nausea/heartburn and a bitter taste in addition to on/off wheezing. For the last 4-5 days, her cough has gotten so bad she can't sleep at night and she can't complete a sentence. She has hx of gastric sleeve in 2021. She has needed to take her pantoprazole  80mg  BID for the last couple days to help the pain. The cough did not improve. She is having difficulty tolerating a few sips of water and not urinating very much. She has an appointment with her GI doctor on the 27th. She has a strong family hx of asthma but never diagnosed herself.  Denies diarrhea or blood in stool.  She endorses constipation secondary to lack of intake.  PERTINENT  PMH / PSH: GERD, depression  OBJECTIVE:  BP 109/78   Pulse (!) 112   Wt 138 lb 6.4 oz (62.8 kg)   LMP 06/25/2023   SpO2 98%   BMI 26.58 kg/m  General: Fatigued appearing HEENT: Erythematous oropharynx CV: Tachycardic, normal rhythm Pulm: Rhonchi bilaterally with trace wheezing GI: Soft, normoactive bowel sounds, epigastric tenderness  ASSESSMENT/PLAN:   Assessment & Plan Gastroesophageal reflux disease, unspecified whether esophagitis present Uncontrolled, complicated with decreased urination secondary to lack of intake.  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  She presents with tachycardia as well prompting concern for unsafe discharge.  Recommend evaluation in ED, at the very least blood work assessing worsening anemia and kidney injury.  She will need sooner evaluation by GI than she currently has scheduled.  Kieth Johnson, DO 06/29/2023, 3:11 PM PGY-3,  [facility]

## 2023-06-29 NOTE — Patient Instructions (Signed)
 It was great to see you today! Thank you for choosing [facility] for your primary care.  Today we addressed: I am recommending he proceed to the emergency department because you are unable to eat or drink and have not been urinating appropriately.  I am concerned you need sooner GI workup for this refractory and uncontrolled reflux you are having.  If you haven't already, sign up for My Chart to have easy access to your labs results, and communication with your primary care physician.  Please arrive 15 minutes before your appointment to ensure smooth check in process.  We appreciate your efforts in making this happen.  Thank you for allowing me to participate in your care, Kieth Johnson, DO 06/29/2023, 2:59 PM PGY-3, [facility]

## 2023-06-30 ENCOUNTER — Emergency Department (HOSPITAL_COMMUNITY): Payer: Medicaid Other

## 2023-06-30 DIAGNOSIS — R079 Chest pain, unspecified: Secondary | ICD-10-CM | POA: Diagnosis not present

## 2023-06-30 DIAGNOSIS — R000 Tachycardia, unspecified: Secondary | ICD-10-CM | POA: Diagnosis not present

## 2023-06-30 DIAGNOSIS — R161 Splenomegaly, not elsewhere classified: Secondary | ICD-10-CM | POA: Diagnosis not present

## 2023-06-30 DIAGNOSIS — R059 Cough, unspecified: Secondary | ICD-10-CM | POA: Diagnosis not present

## 2023-06-30 DIAGNOSIS — R918 Other nonspecific abnormal finding of lung field: Secondary | ICD-10-CM | POA: Diagnosis not present

## 2023-06-30 DIAGNOSIS — J189 Pneumonia, unspecified organism: Secondary | ICD-10-CM | POA: Diagnosis not present

## 2023-06-30 DIAGNOSIS — R0602 Shortness of breath: Secondary | ICD-10-CM | POA: Diagnosis not present

## 2023-06-30 LAB — URINALYSIS, ROUTINE W REFLEX MICROSCOPIC
Glucose, UA: NEGATIVE mg/dL
Ketones, ur: 20 mg/dL — AB
Nitrite: NEGATIVE
Protein, ur: 30 mg/dL — AB
Specific Gravity, Urine: 1.027 (ref 1.005–1.030)
pH: 5 (ref 5.0–8.0)

## 2023-06-30 LAB — RESP PANEL BY RT-PCR (RSV, FLU A&B, COVID)  RVPGX2
Influenza A by PCR: NEGATIVE
Influenza B by PCR: NEGATIVE
Resp Syncytial Virus by PCR: NEGATIVE
SARS Coronavirus 2 by RT PCR: NEGATIVE

## 2023-06-30 LAB — D-DIMER, QUANTITATIVE: D-Dimer, Quant: 1.14 ug{FEU}/mL — ABNORMAL HIGH (ref 0.00–0.50)

## 2023-06-30 MED ORDER — IOHEXOL 350 MG/ML SOLN
75.0000 mL | Freq: Once | INTRAVENOUS | Status: AC | PRN
  Administered 2023-06-30: 75 mL via INTRAVENOUS

## 2023-06-30 MED ORDER — SODIUM CHLORIDE 0.9 % IV BOLUS
1000.0000 mL | Freq: Once | INTRAVENOUS | Status: AC
  Administered 2023-06-30: 1000 mL via INTRAVENOUS

## 2023-06-30 MED ORDER — SODIUM CHLORIDE 0.9 % IV SOLN
1.0000 g | Freq: Once | INTRAVENOUS | Status: AC
  Administered 2023-06-30: 1 g via INTRAVENOUS
  Filled 2023-06-30: qty 10

## 2023-06-30 MED ORDER — PANTOPRAZOLE SODIUM 40 MG IV SOLR
40.0000 mg | Freq: Once | INTRAVENOUS | Status: AC
  Administered 2023-06-30: 40 mg via INTRAVENOUS
  Filled 2023-06-30: qty 10

## 2023-06-30 MED ORDER — AMOXICILLIN 500 MG PO CAPS: 1000.0000 mg | ORAL_CAPSULE | Freq: Three times a day (TID) | ORAL | 0 refills | Status: DC

## 2023-06-30 MED ORDER — SODIUM CHLORIDE 0.9 % IV SOLN
500.0000 mg | Freq: Once | INTRAVENOUS | Status: AC
  Administered 2023-06-30: 500 mg via INTRAVENOUS
  Filled 2023-06-30: qty 5

## 2023-06-30 MED ORDER — METOCLOPRAMIDE HCL 5 MG/ML IJ SOLN
10.0000 mg | Freq: Once | INTRAMUSCULAR | Status: AC
  Administered 2023-06-30: 10 mg via INTRAVENOUS
  Filled 2023-06-30: qty 2

## 2023-06-30 MED ORDER — METOCLOPRAMIDE HCL 10 MG PO TABS: 10.0000 mg | ORAL_TABLET | Freq: Four times a day (QID) | ORAL | 0 refills | Status: DC | PRN

## 2023-06-30 MED ORDER — BENZONATATE 100 MG PO CAPS: 100.0000 mg | ORAL_CAPSULE | Freq: Three times a day (TID) | ORAL | 0 refills | Status: DC

## 2023-06-30 MED ORDER — AZITHROMYCIN 250 MG PO TABS: 250.0000 mg | ORAL_TABLET | Freq: Every day | ORAL | 0 refills | Status: DC

## 2023-06-30 NOTE — ED Provider Notes (Signed)
 [place] EMERGENCY DEPARTMENT AT [facility] Provider Note   CSN: [id number] Arrival date & time: 06/29/23  1525     History  Chief Complaint  Patient presents with   Abdominal Pain    Morgan [name] is a 38 y.o. female.  Patient with history of gastric sleeve surgery presents to the emergency department today for evaluation of abdominal pain, nausea and vomiting.  Symptoms started 5 days ago.  Patient has had a burning pain in her stomach/epigastrium and chest area.  States that symptoms have been persistent.  No lower abdominal pain.  No diarrhea.  No urinary symptoms.  No fevers.  She has been coughing a lot that she relates to reflux.       Home Medications Prior to Admission medications   Medication Sig Start Date End Date Taking? Authorizing Provider  buPROPion  (WELLBUTRIN  XL) 300 MG 24 hr tablet Take 1 tablet (300 mg total) by mouth daily. 05/25/23   Bowen, Darice BRAVO, DO  ferrous gluconate  (FERGON) 324 MG tablet Take 1 tablet (324 mg total) by mouth daily with breakfast. 05/25/23   Bowen, Darice BRAVO, DO  Multiple Vitamin (MULTIVITAMIN) tablet Take 1 tablet by mouth daily.    [provider]  pantoprazole  (PROTONIX ) 40 MG tablet Take 1 tablet (40 mg total) by mouth 2 (two) times daily. 05/25/23   Bowen, Darice BRAVO, DO      Allergies    Metronidazole    Review of Systems   Review of Systems  Physical Exam Updated Vital Signs BP 102/85   Pulse (!) 161   Temp 98.2 F (36.8 C) (Oral)   Resp (!) 35   Wt 62 kg   LMP 06/25/2023 (Exact Date)   SpO2 100%   BMI 26.26 kg/m  Physical Exam Vitals and nursing note reviewed.  Constitutional:      General: She is not in acute distress.    Appearance: She is well-developed.  HENT:     Head: Normocephalic and atraumatic.     Right Ear: External ear normal.     Left Ear: External ear normal.     Nose: Nose normal.  Eyes:     Conjunctiva/sclera: Conjunctivae normal.  Cardiovascular:     Rate and Rhythm:  Regular rhythm. Tachycardia present.     Heart sounds: No murmur heard.    Comments: Patient's heart rate increases with coughing into the 140s, at rest 100s-110's. Pulmonary:     Effort: No respiratory distress.     Breath sounds: No wheezing, rhonchi or rales.  Abdominal:     Palpations: Abdomen is soft.     Tenderness: There is abdominal tenderness (Mild) in the epigastric area. There is no guarding or rebound.  Musculoskeletal:     Cervical back: Normal range of motion and neck supple.     Right lower leg: No edema.     Left lower leg: No edema.  Skin:    General: Skin is warm and dry.     Findings: No rash.  Neurological:     General: No focal deficit present.     Mental Status: She is alert. Mental status is at baseline.     Motor: No weakness.  Psychiatric:        Mood and Affect: Mood normal.     ED Results / Procedures / Treatments   Labs (all labs ordered are listed, but only abnormal results are displayed) Labs Reviewed  COMPREHENSIVE METABOLIC PANEL - Abnormal; Notable for the following  components:      Result Value   Albumin 3.2 (*)    Total Bilirubin 1.5 (*)    All other components within normal limits  CBC - Abnormal; Notable for the following components:   Hemoglobin 10.4 (*)    HCT 33.8 (*)    MCV 79.0 (*)    MCH 24.3 (*)    Platelets 463 (*)    All other components within normal limits  URINALYSIS, ROUTINE W REFLEX MICROSCOPIC - Abnormal; Notable for the following components:   Color, Urine AMBER (*)    APPearance CLOUDY (*)    Hgb urine dipstick MODERATE (*)    Bilirubin Urine SMALL (*)    Ketones, ur 20 (*)    Protein, ur 30 (*)    Leukocytes,Ua SMALL (*)    Bacteria, UA RARE (*)    All other components within normal limits  RESP PANEL BY RT-PCR (RSV, FLU A&B, COVID)  RVPGX2  LIPASE, BLOOD  HCG, SERUM, QUALITATIVE    ED ECG REPORT   Date: 06/30/2023  Rate: 113  Rhythm: sinus tachycardia  QRS Axis: normal  Intervals: normal  ST/T Wave  abnormalities: nonspecific T wave changes  Conduction Disutrbances:none  Narrative Interpretation:   Old EKG Reviewed: changes noted, faster today, t-wave abnl similar  I have personally reviewed the EKG tracing and agree with the computerized printout as noted.  Radiology CT Angio Chest PE W and/or Wo Contrast Result Date: 06/30/2023 CLINICAL DATA:  Shortness of breath, right sided chest pain. Tachycardia Pulmonary embolism (PE) suspected, low to intermediate prob, positive D-dimer suspected RUL PNA, however she has tachycardia, no fever, normal WBC EXAM: CT ANGIOGRAPHY CHEST WITH CONTRAST TECHNIQUE: Multidetector CT imaging of the chest was performed using the standard protocol during bolus administration of intravenous contrast. Multiplanar CT image reconstructions and MIPs were obtained to evaluate the vascular anatomy. RADIATION DOSE REDUCTION: This exam was performed according to the departmental dose-optimization program which includes automated exposure control, adjustment of the mA and/or kV according to patient size and/or use of iterative reconstruction technique. CONTRAST:  75mL OMNIPAQUE  IOHEXOL  350 MG/ML SOLN COMPARISON:  Chest x-ray 06/30/2023 FINDINGS: Cardiovascular: Satisfactory opacification of the pulmonary arteries to the segmental level. No evidence of pulmonary embolism. Normal heart size. No significant pericardial effusion. The thoracic aorta is normal in caliber. No atherosclerotic plaque of the thoracic aorta. No coronary artery calcifications. Mediastinum/Nodes: Question soft tissue density of the mediastinum that could represent conglomerative lymphadenopathy with limited evaluation due to timing of contrast. No enlarged mediastinal, hilar, or axillary lymph nodes. Thyroid  gland, trachea, and esophagus demonstrate no significant findings. Lungs/Pleura: Diffuse mild bronchial wall thickening. Partial opacification of the right upper lobe due to consolidation. Patchy ground-glass  airspace opacities of the residual right upper lobe. Patchy peribronchovascular right middle lobe and right lower lobe airspace opacities and tree-in-bud nodularity. Lingular atelectasis. Question mild tree-in-bud nodularity within the left lower lobe and lingula. No pulmonary mass. No pleural effusion. No pneumothorax. Upper Abdomen: Question upper abdominal lymphadenopathy. Enlarged spleen measuring up to 14 cm in the axial plane. Musculoskeletal: No chest wall abnormality. No suspicious lytic or blastic osseous lesions. No acute displaced fracture. Review of the MIP images confirms the above findings. IMPRESSION: 1. Multifocal pneumonia most prominent along the right upper lobe. 2. Question soft tissue density of the mediastinum that could represent conglomerative lymphadenopathy with limited evaluation due to timing of contrast. Recommend attention on follow-up. 3. Splenomegaly. 4. Question upper abdominal lymphadenopathy. Recommend CT abdomen pelvis with intravenous  contrast for further evaluation. Electronically Signed   By: Morgane  Naveau M.D.   On: 06/30/2023 12:46   DG Chest 2 View Result Date: 06/30/2023 CLINICAL DATA:  Cough and chest pain EXAM: CHEST - 2 VIEW COMPARISON:  None Available. FINDINGS: Normal cardiac and mediastinal contours. Large consolidative opacity within the right upper lung. No pleural effusion or pneumothorax. Thoracic spine degenerative changes. IMPRESSION: Large consolidative opacity within the right upper lung. Findings concerning for pneumonia. Followup PA and lateral chest X-ray is recommended in 3-4 weeks following trial of antibiotic therapy to ensure resolution and exclude underlying malignancy. Electronically Signed   By: Bard Moats M.D.   On: 06/30/2023 07:45    Procedures Procedures    Medications Ordered in ED Medications  ibuprofen  (ADVIL ) tablet 600 mg (has no administration in time range)  sodium chloride  0.9 % bolus 1,000 mL (has no administration in time  range)  sodium chloride  0.9 % bolus 1,000 mL (has no administration in time range)  metoCLOPramide  (REGLAN ) injection 10 mg (has no administration in time range)  pantoprazole  (PROTONIX ) injection 40 mg (has no administration in time range)    ED Course/ Medical Decision Making/ A&P    Patient seen and examined. History obtained directly from patient. Work-up including labs, imaging, EKG ordered in triage, if performed, were reviewed.    Labs/EKG: Independently reviewed and interpreted.  This included: CBC shows normal white blood cell count, hemoglobin slightly low but stable at 10.4 otherwise unremarkable; CMP with minimal elevation in bilirubin, slightly low albumin, normal kidney function and liver function testing; lipase normal; pregnancy negative; UA without compelling signs of infection.   Added COVID/flu testing  Imaging: Ordered chest x-ray and CT abdomen pelvis.  Medications/Fluids: Ordered: IV fluid bolus, Protonix  for reflux symptoms and history of reflux, Reglan  for nausea as well as reported headache.  Most recent vital signs reviewed and are as follows: BP 102/85   Pulse (!) 161   Temp 98.2 F (36.8 C) (Oral)   Resp (!) 35   Wt 62 kg   LMP 06/25/2023 (Exact Date)   SpO2 100%   BMI 26.26 kg/m   Initial impression: Patient presents with nausea and vomiting in setting of reflux symptoms as well as gastric sleeve surgery in the past.  She has not had any complications from this.  She does not have peritonitis on exam.  Her symptoms have been going on for 5 days.  Given this and due to surgical history, advised CT imaging of the abdomen and pelvis to evaluate for signs of bowel obstruction.  Patient agrees.  She does have a headache that she has developed while waiting.  No focal neurologic deficits.  She is tachycardic, likely element of dehydration, however this is worse with coughing.  Her resting heart rate in the room is in the low 100s.  She does have a higher heart  rates while coughing, which likely explains the documented pulse rate of 161.   Reassessment performed. Patient appears comfortable, stable.  Labs personally reviewed and interpreted including: D-dimer elevated at 1.14.  Imaging personally visualized and interpreted including: Chest x-ray, right-sided pneumonia.  Reviewed pertinent lab work and imaging with patient at bedside. Questions answered.   Most current vital signs reviewed and are as follows: BP 103/67 (BP Location: Left Arm)   Pulse 81   Temp 98.9 F (37.2 C) (Oral)   Resp 19   Wt 62 kg   LMP 06/25/2023 (Exact Date)   SpO2 100%  BMI 26.26 kg/m   Plan: Will need CT scan of the chest to evaluate for PE.  D-dimer was elevated due to the elevated heart rate and lack of elevated white blood cell count or fevers which would be most consistent for pneumonia.  Given alternate explanation for the patient's symptoms, CT of the abdomen pelvis was canceled.   1:44 PM Reassessment performed. Patient appears stable on several rechecks.  Labs personally reviewed and interpreted including: Viral panel was negative.  Imaging personally visualized and interpreted including: CT angio of the chest did not demonstrate PE, did confirm multifocal pneumonia.  Lymphadenopathy also noted in the chest and abdomen.  Reviewed pertinent lab work and imaging with patient at bedside. Questions answered.  We discussed that she will need rechecked by her doctor in order to ensure resolution of pneumonia, as well as needed for evaluation of lymphadenopathy.  This will likely need to be rechecked on an outpatient basis.  Most current vital signs reviewed and are as follows: BP 103/67 (BP Location: Left Arm)   Pulse 81   Temp 98.9 F (37.2 C) (Oral)   Resp 19   Wt 62 kg   LMP 06/25/2023 (Exact Date)   SpO2 100%   BMI 26.26 kg/m   Plan: Discharge to home.   Prescriptions written for: Amoxicillin , azithromycin , Tessalon , Reglan  p.o.  Other home  care instructions discussed: Rest, hydration  ED return instructions discussed: Worsening pain, shortness of breath, persistent vomiting  Follow-up instructions discussed: Patient encouraged to follow-up with their PCP in 5 days.                                  Medical Decision Making Amount and/or Complexity of Data Reviewed Labs: ordered. Radiology: ordered.  Risk Prescription drug management.   Patient presented for evaluation of vomiting and epigastric and chest pains.  Patient was found to have a right-sided pneumonia.  She was evaluated for PE, negative CTA.  Normal white blood cell count.  Vomiting controlled.  Patient hydrated with improvement of heart rate.  Patient has been given first round of antibiotics.  She appears stable for home without hypoxia or increased respiratory work of breathing.  The patient's vital signs, pertinent lab work and imaging were reviewed and interpreted as discussed in the ED course. Hospitalization was considered for further testing, treatments, or serial exams/observation. However as patient is well-appearing, has a stable exam, and reassuring studies today, I do not feel that they warrant admission at this time. This plan was discussed with the patient who verbalizes agreement and comfort with this plan and seems reliable and able to return to the Emergency Department with worsening or changing symptoms.           Final Clinical Impression(s) / ED Diagnoses Final diagnoses:  Community acquired pneumonia of right upper lobe of lung  Lymphadenopathy    Rx / DC Orders ED Discharge Orders          Ordered    amoxicillin  (AMOXIL ) 500 MG capsule  3 times daily        06/30/23 1258    azithromycin  (ZITHROMAX ) 250 MG tablet  Daily        06/30/23 1258    benzonatate  (TESSALON ) 100 MG capsule  Every 8 hours        06/30/23 1258    metoCLOPramide  (REGLAN ) 10 MG tablet  Every 6 hours PRN  06/30/23 1345              Desiderio Chew, PA-C 06/30/23 1346    [address] [place] K, DO 06/30/23 [phone]

## 2023-06-30 NOTE — Discharge Instructions (Signed)
 Please read and follow all provided instructions.  Your diagnoses today include:  1. Community acquired pneumonia of right upper lobe of lung   2. Lymphadenopathy     Tests performed today include: Blood counts and electrolytes Chest x-ray -- shows pneumonia D-dimer test screening test for blood clot: Was elevated CT scan of your chest: Did not show blood clots, showed multifocal pneumonia also swollen lymph nodes Vital signs. See below for your results today.   Medications prescribed:  Amoxicillin  - antibiotic  You have been prescribed an antibiotic medicine: take the entire course of medicine even if you are feeling better. Stopping early can cause the antibiotic not to work.  Azithromycin  - antibiotic for respiratory infection  You have been prescribed an antibiotic medicine: take the entire course of medicine even if you are feeling better. Stopping early can cause the antibiotic not to work.  Tessalon  Perles - cough suppressant medication  Take any prescribed medications only as directed.  Home care instructions:  Follow any educational materials contained in this packet.  Take the complete course of antibiotics that you were prescribed.   BE VERY CAREFUL not to take multiple medicines containing Tylenol  (also called acetaminophen ). Doing so can lead to an overdose which can damage your liver and cause liver failure and possibly death.   Follow-up instructions: Please follow-up with your primary care provider in the next 7 days for further evaluation of your symptoms and to ensure resolution of your infection.  You also need to discuss follow-up for the enlargement of lymph nodes seen on the CT today.  Return instructions:  Please return to the Emergency Department if you experience worsening symptoms.  Return immediately with worsening breathing, worsening shortness of breath, or if you feel it is taking you more effort to breathe.  Please return if you have any other  emergent concerns.  Additional Information:  Your vital signs today were: BP 107/78   Pulse 86   Temp 98.9 F (37.2 C) (Oral)   Resp (!) 21   Wt 62 kg   LMP 06/25/2023 (Exact Date)   SpO2 100%   BMI 26.26 kg/m  If your blood pressure (BP) was elevated above 135/85 this visit, please have this repeated by your doctor within one month. --------------

## 2023-07-01 ENCOUNTER — Ambulatory Visit: Payer: Medicaid Other

## 2023-07-06 ENCOUNTER — Ambulatory Visit: Payer: Medicaid Other | Admitting: Student

## 2023-07-06 ENCOUNTER — Ambulatory Visit: Payer: Medicaid Other

## 2023-07-06 ENCOUNTER — Encounter: Payer: Self-pay | Admitting: Student

## 2023-07-06 VITALS — BP 122/80 | HR 84 | Temp 98.4°F | Wt 139.0 lb

## 2023-07-06 DIAGNOSIS — J189 Pneumonia, unspecified organism: Secondary | ICD-10-CM

## 2023-07-06 MED ORDER — DOXYCYCLINE HYCLATE 100 MG PO TABS: 100.0000 mg | ORAL_TABLET | Freq: Two times a day (BID) | ORAL | 0 refills | Status: DC

## 2023-07-06 NOTE — Patient Instructions (Addendum)
 It was great seeing you today.  As we discussed, -Start taking doxycycline  twice daily for 7 days -Please schedule a follow-up visit with us  in 7 days before you leave today   If you have any questions or concerns, please feel free to call the clinic.   Have a wonderful day,  Dr. Barabara Dama [facility] [phone]

## 2023-07-06 NOTE — Assessment & Plan Note (Signed)
 Chest x-ray was concerning for multifocal pneumonia. She is 99% SpO2 on room air, with normal effort.  She does have a dry cough today. Reviewed the chest x-ray and CT angio chest from her ER visit. Unfortunately, today she is not having much symptomatic improvement therefore we will prescribe another round of antibiotics with doxycycline  100 mg twice daily for broader MRSA coverage. She is to return in the next week so that we can see if she is having clinical and symptomatic improvement. At that visit, would like to discuss obtaining CT abdomen/pelvis which was recommended for follow-up on her CT angio chest given pelvic lymphadenopathy. Reviewed above plan with patient she is agreeable. Discussed ER return precautions.

## 2023-07-06 NOTE — Progress Notes (Signed)
    SUBJECTIVE:   CHIEF COMPLAINT / HPI:   Morgan [name] is a 38 year old female here for ED follow-up and she presented with cough and abdominal tenderness, diagnosed with community-acquired pneumonia. She was discharged home with amoxicillin  (start date 06/30/2023, end date 07/05/2023), azithromycin  (start date 06/30/2023, end date 07/03/2023) Tessalon  Perles and Reglan .  Her chest x-ray at that time had a large consolidative opacity in the right upper lung concerning for pneumonia.  Recommendation was for PA and lateral chest x-ray in 3 to 4 weeks following trial of antibiotic therapy to ensure resolution and exclude underlying malignancy.  She had CT that showed multifocal pneumonia with soft tissue density of the mediastinum that could represent conglomerative lymphadenopathy and splenomegaly.  Today, she reports worsening cough and shortness of breath at night. Has wheezing at night. Having some loss of appetite.  PERTINENT  PMH / PSH:  Gastric sleeve surgery  OBJECTIVE:   BP 122/80   Pulse 84   Temp 98.4 F (36.9 C)   Wt 139 lb (63 kg)   LMP 06/25/2023 (Exact Date)   SpO2 100%   BMI 26.70 kg/m   General: NAD, well appearing Cardiac: RRR Neuro: A&O Respiratory: normal WOB on RA. Good lung sounds throughout.  No focal lung findings other than scant wheezing in right upper upper lung field.  Dry cough intermittently. Extremities: Moving all 4 extremities equally  ASSESSMENT/PLAN:   Multifocal pneumonia Chest x-ray was concerning for multifocal pneumonia. She is 99% SpO2 on room air, with normal effort.  She does have a dry cough today. Reviewed the chest x-ray and CT angio chest from her ER visit. Unfortunately, today she is not having much symptomatic improvement therefore we will prescribe another round of antibiotics with doxycycline  100 mg twice daily for broader MRSA coverage. She is to return in the next week so that we can see if she is having clinical and  symptomatic improvement. At that visit, would like to discuss obtaining CT abdomen/pelvis which was recommended for follow-up on her CT angio chest given pelvic lymphadenopathy. Reviewed above plan with patient she is agreeable. Discussed ER return precautions.     Morgan Hendriks, DO [facility]

## 2023-07-08 ENCOUNTER — Ambulatory Visit: Payer: Medicaid Other

## 2023-07-08 DIAGNOSIS — M25552 Pain in left hip: Secondary | ICD-10-CM

## 2023-07-08 DIAGNOSIS — M6281 Muscle weakness (generalized): Secondary | ICD-10-CM

## 2023-07-08 NOTE — Therapy (Signed)
OUTPATIENT PHYSICAL THERAPY TREATMENT NOTE   Patient Name: Taleaha Grosso MRN: [id number] DOB:11/08/1985, 38 y.o., female Today's Date: 07/08/2023  END OF SESSION:  PT End of Session - 07/08/23 0859     Visit Number 10    Number of Visits 17    Date for PT Re-Evaluation 07/20/23    Authorization Type [employer] [place]    PT Start Time [phone]    PT Stop Time 0911    PT Time Calculation (min) 35 min    Activity Tolerance Patient tolerated treatment well;Patient limited by pain    Behavior During Therapy [facility] for tasks assessed/performed              Past Medical History:  Diagnosis Date   Back pain    Complication of anesthesia    Patient states when given the epidural the numbness went up to her nose.    Diabetes mellitus without complication (HCC)    Edema    Heartburn    Hepatitis    Hepatitis B carrier (HCC)    IBS (irritable bowel syndrome)    Osteoarthritis    Palpitations    Vitamin D deficiency    Past Surgical History:  Procedure Laterality Date   CARPAL TUNNEL RELEASE     gastric balloon  01/28/2021   ocular muscle corrective surgery     SLEEVE GASTROPLASTY  2022   Patient Active Problem List   Diagnosis Date Noted   Multifocal pneumonia 07/06/2023   Leukopenia 06/01/2023   Other fatigue 10/07/2022   SOBOE (shortness of breath on exertion) 10/07/2022   Depression 10/07/2022   Gastroesophageal reflux disease 10/07/2022   Depression screen 10/07/2022   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   Gastroesophageal reflux disease without esophagitis 08/26/2022   Tachycardia 07/02/2022   Pain in both feet 05/07/2021   Fatigue due to excessive exertion 05/07/2021   Carpal tunnel syndrome, bilateral 10/12/2019   Chronic low back pain with right-sided sciatica 10/12/2019   Pes planus 10/12/2019   Vitamin D deficiency 10/12/2019   Healthcare maintenance 10/12/2019   Screening for hyperlipidemia 10/12/2019   Diabetes mellitus screening 10/12/2019    Heart palpitations 10/25/2018   IBS (irritable bowel syndrome) 02/11/2014   Iron deficiency anemia 01/05/2014   History of hepatitis B 12/11/2013   Generalized obesity with starting BMI 30 12/11/2013   Hyperprolactinemia (HCC) 06/01/2013    PCP: Tiffany Kocher, DO  REFERRING PROVIDER: Nestor Ramp, MD  REFERRING DIAG: Left hip pain [M25.552]   THERAPY DIAG:  Pain in left hip  Muscle weakness (generalized)  Rationale for Evaluation and Treatment: Rehabilitation  ONSET DATE: 3+ months   SUBJECTIVE:   SUBJECTIVE STATEMENT: Since patient was last seen on 06/24/23, she was seen at ED for abdominal pain and pneumonia. She states that she has recovered from this. Her hip pain at this time doing okay.    PERTINENT HISTORY: Relevant PMHx includes Chronic LBP with Rt-sided sciatica, DM, Hep B, IBS, OA, DOE, Depression, Pes planus   PAIN:  Are you having pain? Yes: NPRS scale: 9-10/10 Pain location: hip, LE  Pain description: "It feel very deep, like stabbing with a rod into my hip and radiating through my femur"  Aggravating factors: prolonged sitting, prolonged standing  Relieving factors: medicine  PRECAUTIONS: None  RED FLAGS: None   WEIGHT BEARING RESTRICTIONS: No  FALLS:  Has patient fallen in last 6 months? 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   NEXT MD VISIT: 06/08/2023 with Dr. Claudean Severance, DO  OBJECTIVE:  Note: Objective measures were completed at Evaluation unless otherwise noted.  DIAGNOSTIC FINDINGS:   05/16/23: DG Hips bilat w or w/o pelvis   IMPRESSION: 1. No acute fracture or dislocation. 2. Mild levocurvature of the lumbar spine with left greater than right L5-S1 disc space narrowing.  PATIENT SURVEYS:  FOTO 29 current, 56 predicted  COGNITION: Overall cognitive status:  Within functional limits for tasks assessed     SENSATION: WFL   POSTURE: No Significant postural limitations   LOWER EXTREMITY ROM: grossly WFL, however pain reported as below  Passive ROM Right eval Left eval  Hip flexion  P!  Hip extension    Hip abduction  P!  Hip adduction    Hip internal rotation  P!  Hip external rotation P! P!  Knee flexion    Knee extension    Ankle dorsiflexion    Ankle plantarflexion    Ankle inversion    Ankle eversion     (Blank rows = not tested)  LOWER EXTREMITY MMT:  MMT Right eval Left eval  Hip flexion 4- 3+  Hip extension 4- 3-  Hip abduction 4 4-  Hip adduction    Hip internal rotation 3- 2  Hip external rotation 3- 2  Knee flexion 4- 3+  Knee extension 4- 3+  Ankle dorsiflexion    Ankle plantarflexion    Ankle inversion    Ankle eversion     (Blank rows = not tested)  LOWER EXTREMITY SPECIAL TESTS:  LEFT Hip special tests: Luisa Hart (FABER) test: positive , Anterior hip impingement test: positive , and Piriformis test: positive   RIGHT  Hip special tests: Luisa Hart (FABER) test: positive , Anterior hip impingement test: negative, and Piriformis test: negative   GAIT: Distance walked: 50 ft Assistive device utilized: None Level of assistance: Complete Independence Comments: no significant gait deviations noted at time of evaluation    TODAY'S TREATMENT:    [facility] Adult PT Treatment:                                                DATE: 07/08/2023  Therapeutic Exercise: Nustep level 5 x 6 mins while gathering subjective info Supine SLR 2 x 8, 2# ankle weight  S/L hip abduction 2 x10, 2# ankle weight  Prone hip extension with knee 2 x 10   Therapeutic Activity:  Reassessment of objective measures and subjective assessment regarding progress towards established goals and updated HEP   Manual Therapy  STM along hip abductor mm group    [facility] Adult PT Treatment:                                                DATE:  06/24/23 Therapeutic Exercise: Nustep level 5 x 6 mins while gathering subjective info Seated hip adduction p-ring 3" hold 2x10 Seated FAQ with hip adduction 2.5# 2x10 BIL Seated hamstring curl GTB 2x10 BIL DKTC with pball x 25  LTR with pball x 20  S/L LEFT Clamshells, 2 x 10 S/L LEFT hip abduction, 2 x 10 Supine marching green  TB 2 x 10 (each)  Supine glute bridges with GTB around knees 2 x 10  SLR 2 x 10 each   [facility] Adult PT Treatment:                                                DATE: 06/22/23 Therapeutic Exercise: Nustep level 5 x 5 mins while gathering subjective info Seated hip adduction p-ring 3" hold 2x10 Seated FAQ with hip adduction 2.5# 2x10 BIL DKTC with pball x 20  LTR with pball x 20  S/L LEFT Clamshells, 2 x 10 S/L LEFT hip abduction, 2 x 10 Supine marching green TB 2 x 10 (each)  Supine glute bridges with GTB around knees 2 x 10  SLR 2 x 10 each     PATIENT EDUCATION:  Education details: reviewed initial home exercise program; discussion of POC, prognosis and goals for skilled PT   Person educated: Patient Education method: Explanation, Demonstration, and Handouts Education comprehension: verbalized understanding, returned demonstration, and needs further education  HOME EXERCISE PROGRAM: Access Code: [email or web address] URL: [email or web address] Date: 06/09/2023 Prepared by: Berta Minor  Exercises - Clamshell with Resistance  - 1 x daily - 3 x weekly - 2 sets - 10 reps - Supine Hip Adduction Isometric with Ball  - 1 x daily - 3 x weekly - 2 sets - 10 reps - 3-5 sec hold - Supine March with Resistance Band  - 1 x daily - 3 x weekly - 2 sets - 10 reps - Supine Active Straight Leg Raise  - 1 x daily - 7 x weekly - 10 reps - Sidelying Hip Abduction  - 1 x daily - 7 x weekly - 2 sets - 10 reps  ASSESSMENT:  CLINICAL IMPRESSION: Patient is reporting good progress towards established functional goals. She was able to perform some hip  strengthening activities today with addition of 2# ankle weights. Plan at this time is to continue focus on hip strengthening activities in order to improve overall functional capacity.   EVAL: Ildiko is a 38 y.o. female  who was seen today for physical therapy evaluation and treatment for Left Hip Pain that radiates distally. She is demonstrating diminished R LE MMT scores and pain with endrange PROM of left hip. She has related pain and difficulty with ambulation after prolonged sitting, bending forward, performing kneel-to-stand for daily prayers, and difficulty sleeping. She requires skilled PT services at this time to address relevant deficits and improve overall function.     OBJECTIVE IMPAIRMENTS: decreased activity tolerance, decreased endurance, difficulty walking, decreased strength, and pain.   ACTIVITY LIMITATIONS: bending, squatting, sleeping, bed mobility, and caring for others  PARTICIPATION LIMITATIONS: meal prep, cleaning, laundry, interpersonal relationship, driving, community activity, and occupation  PERSONAL FACTORS: Fitness, Past/current experiences, Profession, Time since onset of injury/illness/exacerbation, and 3+ comorbidities: Relevant PMHx includes Chronic LBP with Rt-sided sciatica, DM, Hep B, IBS, OA, DOE, Depression, Pes planus  are also affecting patient's functional outcome.   REHAB POTENTIAL: Fair    CLINICAL DECISION MAKING: Evolving/moderate complexity  EVALUATION COMPLEXITY: Moderate   GOALS: Goals reviewed with patient? Yes  SHORT TERM GOALS: Target date: 06/22/2023   Patient will be independent with initial home program for hip strengthening program.  Baseline: provided at eval  Goal status: MET Pt reports adherence 06/22/23   LONG TERM GOALS: Target date: 07/20/2023  Patient will report improved overall functional ability with FOTO score of 50 or greater.   Baseline: 29  07/08/23: 52 Goal status: MET  2.  Patient will report ability to  perform kneel to stand with no more than 3/10 pain in order to perform her daily prayer.  Baseline: moderate-to-severe pain  07/08/2023: 5/10 Goal status: Ongoing  3.  Patient will demonstrate ability to perform floor-to-counter lifting of at least 20# in order to improve tolerance of normal household chores.  Baseline: unable 07/08/23: 4-5/10 pain occasional with picking up 38 y.o. sone  Goal status: Ongoing  4.  Patient will demonstrate at least 4/5 MMT with BIL LE strength testing.  Baseline: see objective measures  Goal status: Ongoing  5.  Patient will demonstrate ability to begin regular exercise program with frequency of at least 2-3x/week without exacerbation of symptoms.  Baseline: 0x/week  07/08/23: 3x/week walking (30-45 minutes)  Goal status: MET  6.  Patient will report ability to sleep through the night without waking from pain at least 5 days/week.  Baseline: waking up daily d/t pain  07/08/23: walking up "less frequently"  Goal status: Ongoing   PLAN:  PT FREQUENCY: 1-2x/week  PT DURATION: 8 weeks  PLANNED INTERVENTIONS: 97164- PT Re-evaluation, 97110-Therapeutic exercises, 97530- Therapeutic activity, 97112- Neuromuscular re-education, 97535- Self Care, 69629- Manual therapy, Patient/Family education, Taping, Dry Needling, Joint mobilization, Joint manipulation, Spinal manipulation, Spinal mobilization, Cryotherapy, and Moist heat  PLAN FOR NEXT SESSION: hip joint mobilization, STM and TPDN as indicated, modalities as appropriate, low-impact aerobic activity for symptom modulation, LE strengthening program, updated HEP and promote independence    Mauri Reading, PT, DPT  07/08/2023 10:09 AM

## 2023-07-09 MED ORDER — BENZONATATE 100 MG PO CAPS: 100.0000 mg | ORAL_CAPSULE | Freq: Two times a day (BID) | ORAL | 0 refills | Status: DC | PRN

## 2023-07-12 ENCOUNTER — Encounter: Payer: Self-pay | Admitting: Family Medicine

## 2023-07-12 ENCOUNTER — Ambulatory Visit: Payer: Medicaid Other | Admitting: Family Medicine

## 2023-07-12 VITALS — BP 101/72 | HR 83 | Ht 60.0 in | Wt 134.0 lb

## 2023-07-12 DIAGNOSIS — K219 Gastro-esophageal reflux disease without esophagitis: Secondary | ICD-10-CM | POA: Diagnosis not present

## 2023-07-12 DIAGNOSIS — F3289 Other specified depressive episodes: Secondary | ICD-10-CM | POA: Diagnosis not present

## 2023-07-12 DIAGNOSIS — Z9884 Bariatric surgery status: Secondary | ICD-10-CM

## 2023-07-12 DIAGNOSIS — Z6826 Body mass index (BMI) 26.0-26.9, adult: Secondary | ICD-10-CM | POA: Diagnosis not present

## 2023-07-12 DIAGNOSIS — D509 Iron deficiency anemia, unspecified: Secondary | ICD-10-CM

## 2023-07-12 DIAGNOSIS — E669 Obesity, unspecified: Secondary | ICD-10-CM

## 2023-07-12 MED ORDER — BUPROPION HCL ER (XL) 300 MG PO TB24: 300.0000 mg | ORAL_TABLET | Freq: Every day | ORAL | 0 refills | Status: DC

## 2023-07-12 MED ORDER — PANTOPRAZOLE SODIUM 40 MG PO TBEC: 40.0000 mg | DELAYED_RELEASE_TABLET | Freq: Two times a day (BID) | ORAL | 0 refills | Status: DC

## 2023-07-12 MED ORDER — FERROUS GLUCONATE 324 (38 FE) MG PO TABS: 324.0000 mg | ORAL_TABLET | Freq: Every day | ORAL | 1 refills | Status: DC

## 2023-07-12 NOTE — Assessment & Plan Note (Signed)
Worsening.  Patient reports worsening GERD taking pantoprazole 80 mg twice daily on her own.  She has had a few episodes of posttussive emesis but denies melena or hematochezia.  She is at risk for GI ulcers due to history of vertical sleeve gastrectomy.  She denies use of NSAIDs, tobacco or alcohol use.  She has been referred to GI and is set up for a visit with Dr. Doy Hutching 07/16/2023.

## 2023-07-12 NOTE — Assessment & Plan Note (Signed)
She has been taking ferrous gluconate 324 mg once daily.  She has previously received IV iron infusions and has follow-up with Dr. Arlana Pouch.  She is still feeling very tired as she is still healing from multifocal pneumonia.  In addition, she is taking a bariatric multivitamin once daily.  She is scheduled for hematology follow-up for both her iron and for a chronic history of leukopenia and new findings of splenomegaly and lymphadenopathy on CT chest reviewed from 06/30/2023

## 2023-07-12 NOTE — Assessment & Plan Note (Signed)
Emotional eating has been under better control on bupropion XL 300 mg once daily without adverse side effect  Continue bupropion XL 300 mg once daily Continue to work on stress reduction, adequate sleep and good nutrition

## 2023-07-12 NOTE — Progress Notes (Signed)
Office: [phone]  /  Fax: [phone]  WEIGHT SUMMARY AND BIOMETRICS  Starting Date: 10/07/22  Starting Weight: 154lb   Weight Lost Since Last Visit: 8lb   Vitals BP: 101/72 Pulse Rate: 83 SpO2: 99 %   Body Composition  Body Fat %: 40.1 % Fat Mass (lbs): 54 lbs Muscle Mass (lbs): 76.4 lbs Total Body Water (lbs): 65 lbs Visceral Fat Rating : 6     HPI  Chief Complaint: OBESITY  Morgan [name] is here to discuss her progress with her obesity treatment plan. She is on the following a lower carbohydrate, vegetable and lean protein rich diet plan and states she is following her eating plan approximately 60-70 % of the time. She states she is exercising 0 minutes 0 times per week.  Interval History:  Since last office visit she is down 8 lb She is down 2.6 lb of muscle mass and down 5.6 lb of body fat This gives her a net weight loss of 20 lb in the past 8 mos She is at her target weight post VSG done 2022 in [place] This weight loss has been mostly due to illness.  She had multifocal pnuemonia between visits She is finishing out a course of doxycycline, still coughing but starting to feel better She has not been very hungry She is working on hydrating well She has had terrible GERD, taking her Pantoprazole 80 mg bid She has received IV iron She is feeling tired  Pharmacotherapy: wellbutrin XL 300 mg daily  PHYSICAL EXAM:  Blood pressure 101/72, pulse 83, height 5' (1.524 m), weight 134 lb (60.8 kg), last menstrual period 06/25/2023, SpO2 99%, unknown if currently breastfeeding. Body mass index is 26.17 kg/m.  General: She is healthy appearing with cough, cooperative, alert, well developed, and in no acute distress. PSYCH: Has normal mood, affect and thought process.   Lungs: Normal breathing effort, no conversational dyspnea.  ASSESSMENT AND PLAN  TREATMENT PLAN FOR OBESITY:  Recommended Dietary Goals  Morgan [name] is currently in the action stage of change. As  such, her goal is to continue weight management plan. She has agreed to practicing portion control and making smarter food choices, such as increasing vegetables and decreasing simple carbohydrates.  Behavioral Intervention  We discussed the following Behavioral Modification Strategies today: increasing lean protein intake to established goals, increasing fiber rich foods, increasing water intake , keeping healthy foods at home, work on managing stress, creating time for self-care and relaxation, planning for success, and continue to work on maintaining a reduced calorie state, getting the recommended amount of protein, incorporating whole foods, making healthy choices, staying well hydrated and practicing mindfulness when eating..  Additional resources provided today: NA  Recommended Physical Activity Goals  Morgan [name] has been advised to work up to 150 minutes of moderate intensity aerobic activity a week and strengthening exercises 2-3 times per week for cardiovascular health, weight loss maintenance and preservation of muscle mass.   She has agreed to Think about enjoyable ways to increase daily physical activity and overcoming barriers to exercise and Increase physical activity in their day and reduce sedentary time (increase NEAT).  Pharmacotherapy changes for the treatment of obesity: none  ASSOCIATED CONDITIONS ADDRESSED TODAY  Iron deficiency anemia, unspecified iron deficiency anemia type Assessment & Plan: She has been taking ferrous gluconate 324 mg once daily.  She has previously received IV iron infusions and has follow-up with Dr. Arlana Pouch.  She is still feeling very tired as she is still healing from multifocal  pneumonia.  In addition, she is taking a bariatric multivitamin once daily.  She is scheduled for hematology follow-up for both her iron and for a chronic history of leukopenia and new findings of splenomegaly and lymphadenopathy on CT chest reviewed from  06/30/2023  Orders: -     Ferrous Gluconate; Take 1 tablet (324 mg total) by mouth daily with breakfast.  Dispense: 30 tablet; Refill: 1  Other depression with emotional eating Assessment & Plan: Emotional eating has been under better control on bupropion XL 300 mg once daily without adverse side effect  Continue bupropion XL 300 mg once daily Continue to work on stress reduction, adequate sleep and good nutrition  Orders: -     buPROPion HCl ER (XL); Take 1 tablet (300 mg total) by mouth daily.  Dispense: 30 tablet; Refill: 0  Gastroesophageal reflux disease, unspecified whether esophagitis present -     Pantoprazole Sodium; Take 1 tablet (40 mg total) by mouth 2 (two) times daily.  Dispense: 180 tablet; Refill: 0  S/P laparoscopic sleeve gastrectomy  Generalized obesity with starting BMI 30  BMI 26.0-26.9,adult  Gastroesophageal reflux disease without esophagitis Assessment & Plan: Worsening.  Patient reports worsening GERD taking pantoprazole 80 mg twice daily on her own.  She has had a few episodes of posttussive emesis but denies melena or hematochezia.  She is at risk for GI ulcers due to history of vertical sleeve gastrectomy.  She denies use of NSAIDs, tobacco or alcohol use.  She has been referred to GI and is set up for a visit with Dr. Doy Hutching 07/16/2023.        She was informed of the importance of frequent follow up visits to maximize her success with intensive lifestyle modifications for her multiple health conditions.   ATTESTASTION STATEMENTS:  Reviewed by clinician on day of visit: allergies, medications, problem list, medical history, surgical history, family history, social history, and previous encounter notes pertinent to obesity diagnosis.   I have personally spent 30 minutes total time today in preparation, patient care, nutritional counseling and documentation for this visit, including the following: review of clinical lab tests; review of medical  tests/procedures/services.      Morgan Brink, DO DABFM, DABOM [facility] [address] [place], [place] [address] [phone]

## 2023-07-13 ENCOUNTER — Ambulatory Visit: Payer: Medicaid Other

## 2023-07-13 DIAGNOSIS — M25552 Pain in left hip: Secondary | ICD-10-CM

## 2023-07-13 DIAGNOSIS — M6281 Muscle weakness (generalized): Secondary | ICD-10-CM

## 2023-07-13 NOTE — Therapy (Signed)
OUTPATIENT PHYSICAL THERAPY TREATMENT NOTE   Patient Name: Morgan [name] MRN: [id number] DOB:11/28/1985, 38 y.o., female Today's Date: 07/13/2023  END OF SESSION:  PT End of Session - 07/13/23 0835     Visit Number 11    Number of Visits 17    Date for PT Re-Evaluation 07/20/23    Authorization Type [employer]    Authorization Time Period 4 visits 06/24/23-07/23/23    Authorization - Visit Number 3    Authorization - Number of Visits 4    PT Start Time 0834    PT Stop Time 0912    PT Time Calculation (min) 38 min    Activity Tolerance Patient tolerated treatment well;Patient limited by pain    Behavior During Therapy [facility] for tasks assessed/performed             Past Medical History:  Diagnosis Date   Back pain    Complication of anesthesia    Patient states when given the epidural the numbness went up to her nose.    Diabetes mellitus without complication (HCC)    Edema    Heartburn    Hepatitis    Hepatitis B carrier (HCC)    IBS (irritable bowel syndrome)    Osteoarthritis    Palpitations    Vitamin D deficiency    Past Surgical History:  Procedure Laterality Date   CARPAL TUNNEL RELEASE     gastric balloon  01/28/2021   ocular muscle corrective surgery     SLEEVE GASTROPLASTY  2022   Patient Active Problem List   Diagnosis Date Noted   Multifocal pneumonia 07/06/2023   Leukopenia 06/01/2023   Other fatigue 10/07/2022   SOBOE (shortness of breath on exertion) 10/07/2022   Depression 10/07/2022   Gastroesophageal reflux disease 10/07/2022   Depression screen 10/07/2022   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   Gastroesophageal reflux disease without esophagitis 08/26/2022   Tachycardia 07/02/2022   Pain in both feet 05/07/2021   Fatigue due to excessive exertion 05/07/2021   Carpal tunnel syndrome, bilateral 10/12/2019   Chronic low back pain with right-sided sciatica 10/12/2019   Pes planus 10/12/2019   Vitamin D deficiency  10/12/2019   Healthcare maintenance 10/12/2019   Screening for hyperlipidemia 10/12/2019   Diabetes mellitus screening 10/12/2019   Heart palpitations 10/25/2018   IBS (irritable bowel syndrome) 02/11/2014   Iron deficiency anemia 01/05/2014   History of hepatitis B 12/11/2013   Generalized obesity with starting BMI 30 12/11/2013   Hyperprolactinemia (HCC) 06/01/2013    PCP: Tiffany Kocher, DO  REFERRING PROVIDER: Nestor Ramp, MD  REFERRING DIAG: Left hip pain [M25.552]   THERAPY DIAG:  Pain in left hip  Muscle weakness (generalized)  Rationale for Evaluation and Treatment: Rehabilitation  ONSET DATE: 3+ months   SUBJECTIVE:   SUBJECTIVE STATEMENT: Patient reports that her pain is feeling more manageable than it did, but that her pain continues if she sleeps on the Lt side.   PERTINENT HISTORY: Relevant PMHx includes Chronic LBP with Rt-sided sciatica, DM, Hep B, IBS, OA, DOE, Depression, Pes planus   PAIN:  Are you having pain? Yes: NPRS scale: 9-10/10 Pain location: hip, LE  Pain description: "It feel very deep, like stabbing with a rod into my hip and radiating through my femur"  Aggravating factors: prolonged sitting, prolonged standing  Relieving factors: medicine  PRECAUTIONS: None  RED FLAGS: None   WEIGHT BEARING RESTRICTIONS: No  FALLS:  Has patient fallen in last 6 months? 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   NEXT MD VISIT: 06/08/2023 with Dr. Claudean Severance, DO  OBJECTIVE:  Note: Objective measures were completed at Evaluation unless otherwise noted.  DIAGNOSTIC FINDINGS:   05/16/23: DG Hips bilat w or w/o pelvis   IMPRESSION: 1. No acute fracture or dislocation. 2. Mild levocurvature of the lumbar spine with left greater than right L5-S1 disc space narrowing.  PATIENT  SURVEYS:  FOTO 29 current, 56 predicted  COGNITION: Overall cognitive status: Within functional limits for tasks assessed     SENSATION: WFL   POSTURE: No Significant postural limitations   LOWER EXTREMITY ROM: grossly WFL, however pain reported as below  Passive ROM Right eval Left eval  Hip flexion  P!  Hip extension    Hip abduction  P!  Hip adduction    Hip internal rotation  P!  Hip external rotation P! P!  Knee flexion    Knee extension    Ankle dorsiflexion    Ankle plantarflexion    Ankle inversion    Ankle eversion     (Blank rows = not tested)  LOWER EXTREMITY MMT:  MMT Right eval Left eval  Hip flexion 4- 3+  Hip extension 4- 3-  Hip abduction 4 4-  Hip adduction    Hip internal rotation 3- 2  Hip external rotation 3- 2  Knee flexion 4- 3+  Knee extension 4- 3+  Ankle dorsiflexion    Ankle plantarflexion    Ankle inversion    Ankle eversion     (Blank rows = not tested)  LOWER EXTREMITY SPECIAL TESTS:  LEFT Hip special tests: Luisa Hart (FABER) test: positive , Anterior hip impingement test: positive , and Piriformis test: positive   RIGHT  Hip special tests: Luisa Hart (FABER) test: positive , Anterior hip impingement test: negative, and Piriformis test: negative   GAIT: Distance walked: 50 ft Assistive device utilized: None Level of assistance: Complete Independence Comments: no significant gait deviations noted at time of evaluation    TODAY'S TREATMENT:   [facility] Adult PT Treatment:                                                DATE: 07/13/23 Therapeutic Exercise: Nustep level 5 x 5 mins while gathering subjective info Supine SLR 2 x 10, 2# ankle weight  BIL S/L hip abduction x10, 2# ankle weight BIL Prone hip extension with knee bent 2 x 10  DKTC with pball x 25  LTR with pball x 20  Bridges with heels on pball 2x8 (cramp in hamstrings on 2nd set) S/L Clamshells GTB, x 10 BIL Supine marching green TB 2 x 10 (each)    [facility] Adult PT  Treatment:                                                DATE: 07/08/2023  Therapeutic Exercise: Nustep level 5 x 6 mins while gathering subjective info Supine SLR 2 x 8, 2# ankle weight  S/L hip abduction 2 x10, 2# ankle weight  Prone hip extension with knee 2 x 10  Therapeutic Activity:  Reassessment of objective measures and subjective assessment regarding progress towards established goals and updated HEP   Manual Therapy  STM along hip abductor mm group    [facility] Adult PT Treatment:                                                DATE: 06/24/23 Therapeutic Exercise: Nustep level 5 x 6 mins while gathering subjective info Seated hip adduction p-ring 3" hold 2x10 Seated FAQ with hip adduction 2.5# 2x10 BIL Seated hamstring curl GTB 2x10 BIL DKTC with pball x 25  LTR with pball x 20  S/L LEFT Clamshells, 2 x 10 S/L LEFT hip abduction, 2 x 10 Supine marching green TB 2 x 10 (each)  Supine glute bridges with GTB around knees 2 x 10  SLR 2 x 10 each   [facility] Adult PT Treatment:                                                DATE: 06/22/23 Therapeutic Exercise: Nustep level 5 x 5 mins while gathering subjective info Seated hip adduction p-ring 3" hold 2x10 Seated FAQ with hip adduction 2.5# 2x10 BIL DKTC with pball x 20  LTR with pball x 20  S/L LEFT Clamshells, 2 x 10 S/L LEFT hip abduction, 2 x 10 Supine marching green TB 2 x 10 (each)  Supine glute bridges with GTB around knees 2 x 10  SLR 2 x 10 each    PATIENT EDUCATION:  Education details: reviewed initial home exercise program; discussion of POC, prognosis and goals for skilled PT   Person educated: Patient Education method: Explanation, Demonstration, and Handouts Education comprehension: verbalized understanding, returned demonstration, and needs further education  HOME EXERCISE PROGRAM: Access Code: [email or web address] URL: [email or web address] Date: 06/09/2023 Prepared by: Berta Minor  Exercises - Clamshell with Resistance  - 1 x daily - 3 x weekly - 2 sets - 10 reps - Supine Hip Adduction Isometric with Ball  - 1 x daily - 3 x weekly - 2 sets - 10 reps - 3-5 sec hold - Supine March with Resistance Band  - 1 x daily - 3 x weekly - 2 sets - 10 reps - Supine Active Straight Leg Raise  - 1 x daily - 7 x weekly - 10 reps - Sidelying Hip Abduction  - 1 x daily - 7 x weekly - 2 sets - 10 reps  ASSESSMENT:  CLINICAL IMPRESSION: Patient presents to PT reporting improvements in her hip pain overall and that her pain continues to bother her if she sleeps on the Lt side. Session today continued to focus on hip strengthening with ankle weights for added resistance. Patient was able to tolerate all prescribed exercises with no adverse effects. Patient continues to benefit from skilled PT services and should be progressed as able to improve functional independence.    EVAL: Nicoli is a 38 y.o. female  who was seen today for physical therapy evaluation and treatment for Left Hip Pain that radiates distally. She is demonstrating diminished R LE MMT scores and pain with endrange PROM of left hip. She has related pain and difficulty with ambulation after prolonged sitting, bending forward, performing kneel-to-stand for daily  prayers, and difficulty sleeping. She requires skilled PT services at this time to address relevant deficits and improve overall function.     OBJECTIVE IMPAIRMENTS: decreased activity tolerance, decreased endurance, difficulty walking, decreased strength, and pain.   ACTIVITY LIMITATIONS: bending, squatting, sleeping, bed mobility, and caring for others  PARTICIPATION LIMITATIONS: meal prep, cleaning, laundry, interpersonal relationship, driving, community activity, and occupation  PERSONAL FACTORS: Fitness, Past/current experiences, Profession, Time since onset of injury/illness/exacerbation, and 3+ comorbidities: Relevant PMHx includes Chronic LBP with  Rt-sided sciatica, DM, Hep B, IBS, OA, DOE, Depression, Pes planus  are also affecting patient's functional outcome.   REHAB POTENTIAL: Fair    CLINICAL DECISION MAKING: Evolving/moderate complexity  EVALUATION COMPLEXITY: Moderate   GOALS: Goals reviewed with patient? Yes  SHORT TERM GOALS: Target date: 06/22/2023   Patient will be independent with initial home program for hip strengthening program.  Baseline: provided at eval  Goal status: MET Pt reports adherence 06/22/23   LONG TERM GOALS: Target date: 07/20/2023   Patient will report improved overall functional ability with FOTO score of 50 or greater.   Baseline: 29  07/08/23: 52 Goal status: MET  2.  Patient will report ability to perform kneel to stand with no more than 3/10 pain in order to perform her daily prayer.  Baseline: moderate-to-severe pain  07/08/2023: 5/10 Goal status: Ongoing  3.  Patient will demonstrate ability to perform floor-to-counter lifting of at least 20# in order to improve tolerance of normal household chores.  Baseline: unable 07/08/23: 4-5/10 pain occasional with picking up 38 y.o. sone  Goal status: Ongoing  4.  Patient will demonstrate at least 4/5 MMT with BIL LE strength testing.  Baseline: see objective measures  Goal status: Ongoing  5.  Patient will demonstrate ability to begin regular exercise program with frequency of at least 2-3x/week without exacerbation of symptoms.  Baseline: 0x/week  07/08/23: 3x/week walking (30-45 minutes)  Goal status: MET  6.  Patient will report ability to sleep through the night without waking from pain at least 5 days/week.  Baseline: waking up daily d/t pain  07/08/23: walking up "less frequently"  Goal status: Ongoing   PLAN:  PT FREQUENCY: 1-2x/week  PT DURATION: 8 weeks  PLANNED INTERVENTIONS: 97164- PT Re-evaluation, 97110-Therapeutic exercises, 97530- Therapeutic activity, 97112- Neuromuscular re-education, 97535- Self Care, 16109-  Manual therapy, Patient/Family education, Taping, Dry Needling, Joint mobilization, Joint manipulation, Spinal manipulation, Spinal mobilization, Cryotherapy, and Moist heat  PLAN FOR NEXT SESSION: hip joint mobilization, STM and TPDN as indicated, modalities as appropriate, low-impact aerobic activity for symptom modulation, LE strengthening program, updated HEP and promote independence    Berta Minor PTA 07/13/2023 9:12 AM

## 2023-07-14 ENCOUNTER — Ambulatory Visit: Payer: Medicaid Other | Admitting: Student

## 2023-07-14 VITALS — BP 104/68 | HR 74 | Ht 60.0 in | Wt 138.8 lb

## 2023-07-14 DIAGNOSIS — J189 Pneumonia, unspecified organism: Secondary | ICD-10-CM | POA: Diagnosis not present

## 2023-07-14 DIAGNOSIS — R9389 Abnormal findings on diagnostic imaging of other specified body structures: Secondary | ICD-10-CM | POA: Diagnosis not present

## 2023-07-14 MED ORDER — CETIRIZINE HCL 10 MG PO TABS: 10.0000 mg | ORAL_TABLET | Freq: Every day | ORAL | 11 refills | Status: AC

## 2023-07-14 MED ORDER — FLUTICASONE PROPIONATE 50 MCG/ACT NA SUSP: 2.0000 | Freq: Every day | NASAL | 6 refills | Status: AC

## 2023-07-14 NOTE — Assessment & Plan Note (Addendum)
Clinically improving. Normal effort on room air with no focal lung findings.  SpO2 98% on room air, no dyspnea on exertion.  Weight is stable. Symptomatically, still has a little bit of a dry cough. I do not think that she needs another round of antibiotics at this time. Differential diagnosis includes: Upper airway cough syndrome, slow resolving pneumonia, GERD (especially since her GERD symptoms have been more bothersome lately) other more rare but serious diagnoses such as sarcoidosis, malignancy -Repeat chest x-ray in 4 weeks -CBC with differential today -Encouraged to use Flonase and cetirizine daily. -Continue to avoid spicy foods, chocolate, coffee and other triggers for GERD -ER and return precautions discussed.

## 2023-07-14 NOTE — Assessment & Plan Note (Addendum)
Reassuringly, well-appearing on exam today.  No focal lung findings or other concerning findings on physical exam today. CT on June 30, 2023 showed possibly soft tissue density of the mediastinum that could represent conglomerative lymphadenopathy with limited evaluation due to timing of contrast, which recommended attention on follow-up. There was also possible upper abdominal lymphadenopathy, with recommendation for CT abdomen/pelvis with IV contrast for further evaluation. -CT abdomen/pelvis with contrast ordered today, will need to go through insurance authorization first.  Then, patient will be called to schedule.

## 2023-07-14 NOTE — Progress Notes (Signed)
    SUBJECTIVE:   CHIEF COMPLAINT / HPI:   Morgan [name] is a 38 year-old female here for follow-up of abnormal CT scan findings.  06/29/2023 seen in ED and had CT angio chest that showed multifocal pneumonia with soft tissue density of the mediastinum that could represent conglomerative lymphadenopathy and splenomegaly.  In addition, had pelvic lymphadenopathy on CT. 07/06/2023: [facility] visit.  Denies any improvement s/p amoxicillin and azithromycin antibiotic course.  Prescribed doxycycline for 7-day course.  She still has a few more days of her doxycycline. Her cough is getting better.  Today, she says she has some 3/10 abdominal pain. No constipation, blood in stool, night sweats or significant weight changes. Stable appetite. She is having to take Pantoprazole 80 mg BID because she is not having relief with 40 mg BID. She is going to follow up with her GI doctor on 07/16/2023.  PERTINENT  PMH / PSH: IBS GERD S/p sleeve gastrectomy Depression  OBJECTIVE:   BP 104/68   Pulse 74   Ht 5' (1.524 m)   Wt 138 lb 12.8 oz (63 kg)   LMP 06/25/2023 (Exact Date)   SpO2 99%   BMI 27.11 kg/m   General: NAD, well appearing, well-nourished Cardiac: RRR Neuro: A&O Respiratory: normal WOB on RA. No wheezing or crackles on auscultation, good lung sounds throughout Extremities: Moving all 4 extremities equally  ASSESSMENT/PLAN:   Abnormal finding on imaging Reassuringly, well-appearing on exam today.  No focal lung findings or other concerning findings on physical exam today. CT on June 30, 2023 showed possibly soft tissue density of the mediastinum that could represent conglomerative lymphadenopathy with limited evaluation due to timing of contrast, which recommended attention on follow-up. There was also possible upper abdominal lymphadenopathy, with recommendation for CT abdomen/pelvis with IV contrast for further evaluation. -CT abdomen/pelvis with contrast ordered today, will need  to go through insurance authorization first.  Then, patient will be called to schedule.   Multifocal pneumonia Clinically improving. Normal effort on room air with no focal lung findings.  SpO2 98% on room air, no dyspnea on exertion.  Weight is stable. Symptomatically, still has a little bit of a dry cough. I do not think that she needs another round of antibiotics at this time. Differential diagnosis includes: Upper airway cough syndrome, slow resolving pneumonia, GERD (especially since her GERD symptoms have been more bothersome lately) other more rare but serious diagnoses such as sarcoidosis, malignancy -Repeat chest x-ray in 4 weeks -CBC with differential today -Encouraged to use Flonase and cetirizine daily. -Continue to avoid spicy foods, chocolate, coffee and other triggers for GERD -ER and return precautions discussed.     Darral Dash, DO [facility]

## 2023-07-14 NOTE — Patient Instructions (Addendum)
It was great seeing you today.  As we discussed, -I ordered a CT scan with contrast to follow-up on those abnormal findings on your other CT scan.  You will be called to schedule this. -You should follow back up with your PCP in 1 month to discuss repeating a chest x-ray.  If symptoms continue to persist, and based on CT findings, we may send you to the lung specialist. -Use Flonase and Cetirizine daily   If you have any questions or concerns, please feel free to call the clinic.   Have a wonderful day,  Dr. Darral Dash [facility] [phone]

## 2023-07-15 ENCOUNTER — Ambulatory Visit: Payer: Medicaid Other

## 2023-07-15 DIAGNOSIS — M6281 Muscle weakness (generalized): Secondary | ICD-10-CM

## 2023-07-15 DIAGNOSIS — M25552 Pain in left hip: Secondary | ICD-10-CM

## 2023-07-15 LAB — CBC WITH DIFFERENTIAL/PLATELET
Basophils Absolute: 0 10*3/uL (ref 0.0–0.2)
Basos: 1 %
EOS (ABSOLUTE): 0.1 10*3/uL (ref 0.0–0.4)
Eos: 2 %
Hematocrit: 34.4 % (ref 34.0–46.6)
Hemoglobin: 11.3 g/dL (ref 11.1–15.9)
Immature Grans (Abs): 0 10*3/uL (ref 0.0–0.1)
Immature Granulocytes: 0 %
Lymphocytes Absolute: 1.7 10*3/uL (ref 0.7–3.1)
Lymphs: 45 %
MCH: 26.3 pg — ABNORMAL LOW (ref 26.6–33.0)
MCHC: 32.8 g/dL (ref 31.5–35.7)
MCV: 80 fL (ref 79–97)
Monocytes Absolute: 0.3 10*3/uL (ref 0.1–0.9)
Monocytes: 8 %
Neutrophils Absolute: 1.7 10*3/uL (ref 1.4–7.0)
Neutrophils: 44 %
Platelets: 298 10*3/uL (ref 150–450)
RBC: 4.29 x10E6/uL (ref 3.77–5.28)
RDW: 15.3 % (ref 11.7–15.4)
WBC: 3.8 10*3/uL (ref 3.4–10.8)

## 2023-07-15 NOTE — Therapy (Signed)
OUTPATIENT PHYSICAL THERAPY TREATMENT NOTE   Patient Name: Morgan [name] MRN: [id number] DOB:1986/04/13, 38 y.o., female Today's Date: 07/15/2023  END OF SESSION:  PT End of Session - 07/15/23 0855     Visit Number 12    Number of Visits 17    Date for PT Re-Evaluation 08/12/23    Authorization Type [employer]    Authorization Time Period 4 visits 06/24/23-07/23/23    Authorization - Visit Number 4    Authorization - Number of Visits 4    PT Start Time 0834    PT Stop Time 0907    PT Time Calculation (min) 33 min    Activity Tolerance Patient limited by pain;Patient tolerated treatment well    Behavior During Therapy [facility] for tasks assessed/performed              Past Medical History:  Diagnosis Date   Back pain    Complication of anesthesia    Patient states when given the epidural the numbness went up to her nose.    Diabetes mellitus without complication (HCC)    Edema    Heartburn    Hepatitis    Hepatitis B carrier (HCC)    IBS (irritable bowel syndrome)    Osteoarthritis    Palpitations    Vitamin D deficiency    Past Surgical History:  Procedure Laterality Date   CARPAL TUNNEL RELEASE     gastric balloon  01/28/2021   ocular muscle corrective surgery     SLEEVE GASTROPLASTY  2022   Patient Active Problem List   Diagnosis Date Noted   Abnormal finding on imaging 07/14/2023   Multifocal pneumonia 07/06/2023   Leukopenia 06/01/2023   Other fatigue 10/07/2022   SOBOE (shortness of breath on exertion) 10/07/2022   Depression 10/07/2022   Gastroesophageal reflux disease 10/07/2022   Depression screen 10/07/2022   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   Gastroesophageal reflux disease without esophagitis 08/26/2022   Tachycardia 07/02/2022   Pain in both feet 05/07/2021   Fatigue due to excessive exertion 05/07/2021   Carpal tunnel syndrome, bilateral 10/12/2019   Chronic low back pain with right-sided sciatica 10/12/2019   Pes planus  10/12/2019   Vitamin D deficiency 10/12/2019   Healthcare maintenance 10/12/2019   Screening for hyperlipidemia 10/12/2019   Diabetes mellitus screening 10/12/2019   Heart palpitations 10/25/2018   IBS (irritable bowel syndrome) 02/11/2014   Iron deficiency anemia 01/05/2014   History of hepatitis B 12/11/2013   Generalized obesity with starting BMI 30 12/11/2013   Hyperprolactinemia (HCC) 06/01/2013    PCP: Tiffany Kocher, DO  REFERRING PROVIDER: Nestor Ramp, MD  REFERRING DIAG: Left hip pain [M25.552]   THERAPY DIAG:  Pain in left hip  Muscle weakness (generalized)  Rationale for Evaluation and Treatment: Rehabilitation  ONSET DATE: 3+ months   SUBJECTIVE:   SUBJECTIVE STATEMENT: Patient reports to PT with 3-4/10 pain. She reports feeling that she has made good progress with PT so far and she would like to continue to improve her strength and pain.     PERTINENT HISTORY: Relevant PMHx includes Chronic LBP with Rt-sided sciatica, DM, Hep B, IBS, OA, DOE, Depression, Pes planus   PAIN:  Are you having pain? Yes: NPRS scale: 9-10/10 Pain location: hip, LE  Pain description: "It feel very deep, like stabbing with a rod into my hip and radiating through my femur"  Aggravating factors: prolonged sitting, prolonged standing  Relieving factors: medicine  PRECAUTIONS: None  RED FLAGS:  None   WEIGHT BEARING RESTRICTIONS: No  FALLS:  Has patient fallen in last 6 months? 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   NEXT MD VISIT: 06/08/2023 with Dr. Claudean Severance, DO  OBJECTIVE:  Note: Objective measures were completed at Evaluation unless otherwise noted.  DIAGNOSTIC FINDINGS:   05/16/23: DG Hips bilat w or w/o pelvis   IMPRESSION: 1. No acute fracture or dislocation. 2. Mild levocurvature of the  lumbar spine with left greater than right L5-S1 disc space narrowing.  PATIENT SURVEYS:  FOTO 29 current, 56 predicted  COGNITION: Overall cognitive status: Within functional limits for tasks assessed     SENSATION: WFL   POSTURE: No Significant postural limitations   LOWER EXTREMITY ROM: grossly WFL, however pain reported as below  Passive ROM Right eval Left eval  Hip flexion  P!  Hip extension    Hip abduction  P!  Hip adduction    Hip internal rotation  P!  Hip external rotation P! P!  Knee flexion    Knee extension    Ankle dorsiflexion    Ankle plantarflexion    Ankle inversion    Ankle eversion     (Blank rows = not tested)  LOWER EXTREMITY MMT:  MMT Right eval Left eval Right 07/15/23 Left 07/15/23  Hip flexion 4- 3+ 4 4-  Hip extension 4- 3- 4 3  Hip abduction 4 4- 4+ 4-  Hip adduction      Hip internal rotation 3- 2 4- 3+  Hip external rotation 3- 2 4- 3+  Knee flexion 4- 3+ 4+ 4+  Knee extension 4- 3+ 4+ 4  Ankle dorsiflexion      Ankle plantarflexion      Ankle inversion      Ankle eversion       (Blank rows = not tested)  LOWER EXTREMITY SPECIAL TESTS:  LEFT Hip special tests: Luisa Hart (FABER) test: positive , Anterior hip impingement test: positive , and Piriformis test: positive   RIGHT  Hip special tests: Luisa Hart (FABER) test: positive , Anterior hip impingement test: negative, and Piriformis test: negative   GAIT: Distance walked: 50 ft Assistive device utilized: None Level of assistance: Complete Independence Comments: no significant gait deviations noted at time of evaluation    TODAY'S TREATMENT:    [facility] Adult PT Treatment:                                                DATE: 07/15/23  Therapeutic Exercise: Nustep level 3 x 5 mins while gathering subjective info Supine SLR 2 x 10 BIL S/L hip abduction 2 x10 BIL Prone hip extension with knee bent 2 x 10  DKTC with pball x 20 LTR with pball x 10 Bridges with calf on pball  4x5  Supine knee fall outs with RTB, 2 x 10 each  Supine marching "up, up, down, down"  x 12   Therapeutic Activity:  Reassessment of objective measures and subjective assessment regarding progress towards established goals and plan for updated POC    [facility] Adult PT Treatment:  DATE: 07/13/23 Therapeutic Exercise: Nustep level 5 x 5 mins while gathering subjective info Supine SLR 2 x 10, 2# ankle weight  BIL S/L hip abduction x10, 2# ankle weight BIL Prone hip extension with knee bent 2 x 10  DKTC with pball x 25  LTR with pball x 20  Bridges with heels on pball 2x8 (cramp in hamstrings on 2nd set) S/L Clamshells GTB, x 10 BIL Supine marching green TB 2 x 10 (each)    [facility] Adult PT Treatment:                                                DATE: 07/08/2023  Therapeutic Exercise: Nustep level 5 x 6 mins while gathering subjective info Supine SLR 2 x 8, 2# ankle weight  S/L hip abduction 2 x10, 2# ankle weight  Prone hip extension with knee 2 x 10   Therapeutic Activity:  Reassessment of objective measures and subjective assessment regarding progress towards established goals and updated HEP   Manual Therapy  STM along hip abductor mm group       PATIENT EDUCATION:  Education details: reviewed initial home exercise program; discussion of POC, prognosis and goals for skilled PT   Person educated: Patient Education method: Explanation, Demonstration, and Handouts Education comprehension: verbalized understanding, returned demonstration, and needs further education  HOME EXERCISE PROGRAM: Access Code: [email or web address] URL: [email or web address] Date: 07/15/2023 Prepared by: Mauri Reading  Exercises - Clamshell with Resistance  - 1 x daily - 3 x weekly - 20 reps - Supine March with Resistance Band  - 1 x daily - 3 x weekly - 20 reps - Supine Active Straight Leg Raise  - 1 x daily - 3 x weekly - 20 reps - Sidelying Hip  Abduction  - 1 x daily - 3 x weekly - 20 reps - Prone Hip Extension with Bent Knee  - 1 x daily - 3 x weekly - 20 reps - Supine Double Leg Bridge on Box  - 1 x daily - 3 x weekly - 20 reps  CLINICAL IMPRESSION: Patient has attended 11 total PT visits at this time due to left hip pain and BIL lower extremity weakness. She is making good progress with lower extremity strength and is reporting improving tolerance of daily activities. However, she continues to be limited by pain with her daily prayers, lifting her son, and laying on her left side. She is has been remaining consistent with home exercise program. Provided patient with updated HEP today. Plan is to continue 1-2x/week for 4 weeks to further progress towards established rehab goals pending insurance authorization.       OBJECTIVE IMPAIRMENTS: decreased activity tolerance, decreased endurance, difficulty walking, decreased strength, and pain.   ACTIVITY LIMITATIONS: bending, squatting, sleeping, bed mobility, and caring for others  PARTICIPATION LIMITATIONS: meal prep, cleaning, laundry, interpersonal relationship, driving, community activity, and occupation  PERSONAL FACTORS: Fitness, Past/current experiences, Profession, Time since onset of injury/illness/exacerbation, and 3+ comorbidities: Relevant PMHx includes Chronic LBP with Rt-sided sciatica, DM, Hep B, IBS, OA, DOE, Depression, Pes planus  are also affecting patient's functional outcome.   REHAB POTENTIAL: Fair    CLINICAL DECISION MAKING: Evolving/moderate complexity  EVALUATION COMPLEXITY: Moderate   GOALS: Goals reviewed with patient? Yes  SHORT TERM GOALS: Target date: 06/22/2023   Patient will be independent with initial home program for hip  strengthening program.  Baseline: provided at eval  Goal status: MET Pt reports adherence 06/22/23   LONG TERM GOALS: Target date: 08/12/2023, last updated 07/15/23   Patient will report improved overall functional  ability with FOTO score of 50 or greater.   Baseline: 29  07/08/23: 52 Goal status: MET  2.  Patient will report ability to perform kneel to stand with no more than 3/10 pain in order to perform her daily prayer.  Baseline: moderate-to-severe pain  07/08/2023: 5/10 Goal status: Ongoing  3.  Patient will demonstrate ability to perform floor-to-counter lifting of at least 20# in order to improve tolerance of normal household chores.  Baseline: unable 07/08/23: 4-5/10 pain occasional with picking up 4 y.o. son Goal status: Ongoing  4.  Patient will demonstrate at least 4/5 MMT with BIL LE strength testing.  Baseline: see objective measures  07/15/23: see objective measures  Goal status: Progressing  5.  Patient will demonstrate ability to begin regular exercise program with frequency of at least 2-3x/week without exacerbation of symptoms.  Baseline: 0x/week  07/08/23: 3x/week walking (30-45 minutes)  Goal status: MET  6.  Patient will report ability to sleep through the night without waking from pain at least 5 days/week.  Baseline: waking up daily d/t pain  07/08/23: walking up "less frequently"  Goal status: Ongoing   PLAN:  PT FREQUENCY: 1-2x/week  PT DURATION: 4 weeks, last updated 07/15/23  PLANNED INTERVENTIONS: 97164- PT Re-evaluation, 97110-Therapeutic exercises, 97530- Therapeutic activity, 97112- Neuromuscular re-education, 97535- Self Care, [id number]- Manual therapy, Patient/Family education, Taping, Dry Needling, Joint mobilization, Joint manipulation, Spinal manipulation, Spinal mobilization, Cryotherapy, and Moist heat  PLAN FOR NEXT SESSION: hip joint mobilization, STM and TPDN as indicated, modalities as appropriate, low-impact aerobic activity for symptom modulation, LE strengthening program, updated HEP and promote independence    Mauri Reading, PT, DPT  07/15/2023 9:21 AM

## 2023-07-15 NOTE — Progress Notes (Signed)
[facility] Initial Consultation   Referring Provider Tiffany Kocher, DO [address] [place],  [place] [address]  Primary Care Provider Tiffany Kocher, DO  Patient Profile: Morgan [name] is a 38 y.o. female with a past medical history noteworthy for obesity status post laparoscopic sleeve gastrectomy, vitamin D deficiency, carpal tunnel syndrome who is seen in consultation in the [facility] at the request of Dr. Claudean Severance for evaluation and management of the problem(s) noted below.  Problem List: Iron deficiency anemia GERD History of IBS History of hepatitis B   History of Present Illness   [name] is a 38 y.o. female with a history of obesity status post laparoscopic sleeve gastrectomy, vitamin D deficiency, carpal tunnel syndrome   GERD Morgan [name] reports a longstanding history of GERD dating back to her teenage years Reports she had her first EGD at age 9 and was told there was irritation in her esophagus but no Barrett's esophagus Her GERD has been managed over the years with PPIs and famotidine  In 01/2021 she underwent bariatric surgery with sleeve gastrectomy in [place] States she had an EGD around that time that showed minor erythema Reports that her symptoms of reflux worsened after her bariatric surgery  Over the years she has been managed with lansoprazole and pantoprazole; reports esomeprazole resulted in nausea  In fall 2024 she contracted what she describes as the flu and subsequently developed pneumonia After developing her infectious illness she started to experience a tearing and burning sensation in her esophagus/epigastrium Endorses worsening regurgitation, hoarseness and cough States that when she lies flat she has significant regurgitation and needs to sleep at a 45 degree incline Reports that her course after pneumonia has been somewhat protracted and she recently completed 10 days of doxycycline  Of her own  volition she increased her pantoprazole to 80 mg p.o. twice daily and states that this is the only thing that helps her GERD Denies that she is consuming any dietary triggers of GERD No nausea, vomiting, dysphagia or odynophagia  She has not had a recent EGD  IDA Morgan [name] also has a longstanding history of anemia that she states dates back to her adolescence/early 62s Denies melena, hematochezia, hematemesis No menorrhagia-has a copper IUD  Labs 06/01/2023: WBC 3.8, Hgb 11.1, HCT 37, platelets 273 Iron 23, TIBC 473, ferritin 9  Labs 07/14/23: WBC 3.8, Hgb 11.3, HCT 34.4, platelets 298  Has received iron infusions but states that she still feels tired She has never had a colonoscopy  No family history of colorectal cancer, gastric cancer or esophageal cancer No personal or family history of celiac disease or inflammatory bowel disease  Of note, recent CT angio chest showed possible adenopathy in the mediastinum and upper abdomen A CTAP has been ordered -she is awaiting insurance approval and scheduling  HBV Records indicate that Morgan [name] tested positive for hepatitis B in the past She relates that 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 Otherwise negative.  General Review of Systems  Review of systems is significant for the pertinent positives and negatives as listed per the HPI.  Full ROS is otherwise negative.  Past Medical History   Past Medical History:  Diagnosis Date   Anemia    Back pain    Complication of anesthesia    Patient states when given the epidural the numbness went up to her nose.    Diabetes mellitus without complication  (HCC)    Edema    GERD (gastroesophageal reflux disease)    Heartburn    Hepatitis    Hepatitis B carrier (HCC)    IBS (irritable bowel syndrome)    Obesity    Osteoarthritis    Palpitations    Pneumonia    Vitamin D deficiency      Past Surgical History   Past Surgical History:  Procedure Laterality Date   CARPAL TUNNEL RELEASE     gastric balloon  01/28/2021   ocular muscle corrective surgery     SLEEVE GASTROPLASTY  2022     Allergies and Medications   Allergies  Allergen Reactions   Metronidazole Other (See Comments)    Severe hypotension    Current Meds  Medication Sig   acetaminophen (TYLENOL) 500 MG tablet Take 500-1,000 mg by mouth every 6 (six) hours as needed for moderate pain (pain score 4-6).   azithromycin (ZITHROMAX) 250 MG tablet Take 1 tablet (250 mg total) by mouth daily.   baclofen (LIORESAL) 10 MG tablet Take 1 tablet (10 mg total) by mouth at bedtime.   benzonatate (TESSALON) 100 MG capsule Take 1 capsule (100 mg total) by mouth 2 (two) times daily as needed for cough.   buPROPion (WELLBUTRIN XL) 300 MG 24 hr tablet Take 1 tablet (300 mg total) by mouth daily.   cetirizine (ZYRTEC) 10 MG tablet Take 1 tablet (10 mg total) by mouth daily.   doxycycline (VIBRA-TABS) 100 MG tablet Take 1 tablet (100 mg total) by mouth 2 (two) times daily.   ferrous gluconate (FERGON) 324 MG tablet Take 1 tablet (324 mg total) by mouth daily with breakfast.   fluticasone (FLONASE) 50 MCG/ACT nasal spray Place 2 sprays into both nostrils daily.   Multiple Vitamin (MULTIVITAMIN) tablet Take 1 tablet by mouth daily.   Na Sulfate-K Sulfate-Mg Sulfate concentrate 17.5-3.13-1.6 GM/177ML SOLN Use as directed; may use generic; goodrx card if insurance will not cover generic   naproxen sodium (ALEVE) 220 MG tablet Take 440 mg by mouth 2 (two) times daily as needed (Pain).   pantoprazole (PROTONIX) 40 MG tablet Take 1 tablet (40 mg total) by mouth 2 (two) times daily. (Patient  taking differently: Take 80 mg by mouth 2 (two) times daily.)   [DISCONTINUED] benzonatate (TESSALON) 100 MG capsule Take 1 capsule (100 mg total) by mouth every 8 (eight) hours.     Family History   Family History  Problem Relation Age of Onset   Hypertension Mother    Irritable bowel syndrome Mother    Hyperthyroidism Mother    Asthma Mother    Hypertension Father    Hyperlipidemia Father    Irritable bowel syndrome Father    Kidney disease Sister    Hyperthyroidism Sister    Asthma Sister    Asthma Maternal Grandmother    Asthma Son    Alcohol abuse Neg Hx    Arthritis Neg Hx    Birth defects Neg Hx    Cancer Neg Hx    COPD Neg Hx    Depression Neg Hx    Diabetes Neg Hx    Drug abuse Neg Hx    Early death Neg Hx    Hearing  loss Neg Hx    Heart disease Neg Hx    Learning disabilities Neg Hx    Mental illness Neg Hx    Mental retardation Neg Hx    Miscarriages / Stillbirths Neg Hx    Stroke Neg Hx    Vision loss Neg Hx      Social History   Social History   Tobacco Use   Smoking status: Never    Passive exposure: Never   Smokeless tobacco: Never  Vaping Use   Vaping status: Never Used  Substance Use Topics   Alcohol use: Never   Drug use: Never   Morgan [name] reports that she has never smoked. She has never been exposed to tobacco smoke. She has never used smokeless tobacco. She reports that she does not drink alcohol and does not use drugs.  Vital Signs and Physical Examination   Vitals:   07/16/23 0839  BP: (!) 80/60  Pulse: 62  Height: 5' 0.5" (1.537 m)  Weight: 141 lb 2 oz (64 kg)  BMI (Calculated): 27.1    General: Well developed, well nourished, no acute distress Head: Normocephalic and atraumatic Eyes: Sclerae anicteric, EOMI Ears: Normal auditory acuity Mouth: No deformities or lesions noted Lungs: Clear throughout to auscultation Heart: Regular rate and rhythm; No murmurs, rubs or bruits Abdomen: Soft, mild tenderness to palpation in the  epigastrium, non distended. No masses, hepatosplenomegaly or hernias noted. Normal Bowel sounds Rectal: Deferred Musculoskeletal: Symmetrical with no gross deformities  Pulses:  Normal pulses noted Extremities: No edema or deformities noted Neurological: Alert oriented x 4, grossly nonfocal Psychological:  Alert and cooperative. Normal mood and affect  Review of Data  The following data was reviewed at the time of this encounter:  Laboratory Studies      Latest Ref Rng & Units 07/14/2023    9:00 AM 06/29/2023    4:30 PM 06/01/2023   10:21 AM  CBC  WBC 3.4 - 10.8 x10E3/uL 3.8  8.2  3.8   Hemoglobin 11.1 - 15.9 g/dL 78.2  95.6  21.3   Hematocrit 34.0 - 46.6 % 34.4  33.8  37.0   Platelets 150 - 450 x10E3/uL 298  463  273     Lab Results  Component Value Date   LIPASE 23 06/29/2023      Latest Ref Rng & Units 06/29/2023    4:30 PM 04/21/2023   10:40 AM 10/07/2022    8:11 AM  CMP  Glucose 70 - 99 mg/dL 94  73  80   BUN 6 - 20 mg/dL 11  9  13    Creatinine 0.44 - 1.00 mg/dL 0.86  5.78  4.69   Sodium 135 - 145 mmol/L 138  140  137   Potassium 3.5 - 5.1 mmol/L 4.0  4.5  4.6   Chloride 98 - 111 mmol/L 102  103  100   CO2 22 - 32 mmol/L 24  23  19    Calcium 8.9 - 10.3 mg/dL 9.3  9.5  9.6   Total Protein 6.5 - 8.1 g/dL 7.2  6.5  7.3   Total Bilirubin 0.0 - 1.2 mg/dL 1.5  0.3  0.3   Alkaline Phos 38 - 126 U/L 94  61  69   AST 15 - 41 U/L 15  10  13    ALT 0 - 44 U/L 13  11  12     Labs 06/01/2023: WBC 3.8, Hgb 11.1, HCT 37, platelets 273 Iron 23, TIBC 473, ferritin 9  Labs 07/14/23:  WBC 3.8, Hgb 11.3, HCT 34.4, platelets 298  Imaging Studies  CT angio chest 06/30/2023 1. Multifocal pneumonia most prominent along the right upper lobe. 2. Question soft tissue density of the mediastinum that could represent conglomerative lymphadenopathy with limited evaluation due to timing of contrast. Recommend attention on follow-up. 3. Splenomegaly. 4. Question upper abdominal  lymphadenopathy. Recommend CT abdomen pelvis with intravenous contrast for further evaluation.   GI Procedures and Studies  Patient reports having an EGD in 2022 that showed "minor erythema"  Clinical Impression  It is my clinical impression that [name] is a 38 y.o. female with;  Iron deficiency anemia GERD History of IBS History of hepatitis B  Morgan [name] presents to the office today primarily for evaluation of worsening GERD.  She reports a longstanding history of GERD dating back to adolescence/early 30s.  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  Typically her GERD is well-managed with a proton pump inhibitor and/or H2 blocker.  In fall 2024 she incurred a respiratory infection with what she describes as flu and pneumonia.  Since that time she has experienced a tearing and burning sensation in her esophagus, regurgitation, hoarseness and cough.  She has self escalated doses of her proton pump inhibitor to pantoprazole 80 mg p.o. twice daily to mitigate her symptoms.  Reviewed that in the setting of her previous bariatric surgery she could be at risk of gastritis and possibly ulceration at the surgical staple line.  In the setting of her longstanding GERD she may also be at risk of Barrett's esophagus.  As such, I recommended an updated EGD and she is amenable to proceeding.  In the short-term I have recommended continuing her current regimen of PPI and discussed the use of adjuvant baclofen to tighten the lower esophageal sphincter tone.  Reviewed potential risks of sedation.  In terms of her iron deficiency anemia, she is not endorsing active symptoms of melena, hematochezia, hematemesis.  Given that she will be undergoing EGD for evaluation of her GERD symptoms in the near future we discussed also performing a colonoscopy in an effort to fully evaluate her iron deficiency anemia.  Given that she has had a gastric bypass I have recommended a  low-volume bowel prep in the form of Suprep.  Once she has received insurance approval for updated CTAP this will be another important aspect of her anemia evaluation.  She also has follow-up with heme-onc in March 2025.  Given limitations of time we did not delve deeply into her history of hepatitis B today.  As outlined in the HPI she reports a possible needlestick resulting in her being an HBV carrier but states that she was told her last set of serologies 4 years ago indicated that she had cleared the infection.  At a future visit we can revisit whether additional laboratory monitoring or imaging is required.  Plan  Schedule EGD and colonoscopy at [facility] with a low-volume Suprep bowel prep Continue pantoprazole 80 mg p.o. twice daily as a temporary measure until further information is gained from her EGD Start baclofen 10 mg p.o. nightly x 1 week; if reflux persists increase to 10 mg p.o. twice daily.  Reviewed risks of sedation Continue anemia workup with PCP and heme-onc; follow-up results of CTAP At a future appointment can revisit issues related to HBV history.  Planned Follow Up 2-3 months  The patient or caregiver verbalized understanding of the material covered, with no barriers to understanding. All questions were  answered. Patient or caregiver is agreeable with the plan outlined above.    It was a pleasure to see Morgan [name].  If you have any questions or concerns regarding this evaluation, do not hesitate to contact me.  Maren Beach, MD [facility]   I spent total of 45 minutes in both face-to-face and non-face-to-face activities, excluding procedures performed, for the visit on the date of this encounter.

## 2023-07-16 ENCOUNTER — Encounter: Payer: Self-pay | Admitting: Student

## 2023-07-16 ENCOUNTER — Encounter: Payer: Self-pay | Admitting: Pediatrics

## 2023-07-16 ENCOUNTER — Ambulatory Visit: Payer: Medicaid Other | Admitting: Pediatrics

## 2023-07-16 VITALS — BP 80/60 | HR 62 | Ht 60.5 in | Wt 141.1 lb

## 2023-07-16 DIAGNOSIS — D509 Iron deficiency anemia, unspecified: Secondary | ICD-10-CM | POA: Diagnosis not present

## 2023-07-16 DIAGNOSIS — Z8619 Personal history of other infectious and parasitic diseases: Secondary | ICD-10-CM | POA: Diagnosis not present

## 2023-07-16 DIAGNOSIS — K219 Gastro-esophageal reflux disease without esophagitis: Secondary | ICD-10-CM | POA: Diagnosis not present

## 2023-07-16 DIAGNOSIS — Z8719 Personal history of other diseases of the digestive system: Secondary | ICD-10-CM | POA: Diagnosis not present

## 2023-07-16 MED ORDER — NA SULFATE-K SULFATE-MG SULF 17.5-3.13-1.6 GM/177ML PO SOLN
ORAL | 0 refills | Status: DC
Start: 2023-07-16 — End: 2023-08-19

## 2023-07-16 MED ORDER — BACLOFEN 10 MG PO TABS: 10.0000 mg | ORAL_TABLET | Freq: Every evening | ORAL | 0 refills | Status: DC

## 2023-07-16 NOTE — Patient Instructions (Addendum)
You have been scheduled for an endoscopy and colonoscopy. Please follow the written instructions given to you at your visit today.  Please pick up your prep supplies at the pharmacy within the next 1-3 days.  If you use inhalers (even only as needed), please bring them with you on the day of your procedure.  DO NOT TAKE 7 DAYS PRIOR TO TEST- Trulicity (dulaglutide) Ozempic, Wegovy (semaglutide) Mounjaro (tirzepatide) Bydureon Bcise (exanatide extended release)  DO NOT TAKE 1 DAY PRIOR TO YOUR TEST Rybelsus (semaglutide) Adlyxin (lixisenatide) Victoza (liraglutide) Byetta (exanatide) ___________________________________________________________________________   We have sent the following medications to your pharmacy for you to pick up at your convenience: Suprep, Baclofen 10 mg take 1 tablet at bedtime for 1 week if reflux is not better take 1 tablet 2 times daily  _______________________________________________________  If your blood pressure at your visit was 140/90 or greater, please contact your primary care physician to follow up on this.  _______________________________________________________  If you are age 39 or older, your body mass index should be between 23-30. Your Body mass index is 27.11 kg/m. If this is out of the aforementioned range listed, please consider follow up with your Primary Care Provider.  If you are age 39 or younger, your body mass index should be between 19-25. Your Body mass index is 27.11 kg/m. If this is out of the aformentioned range listed, please consider follow up with your Primary Care Provider.   ________________________________________________________  The [facility] providers would like to encourage you to use [facility] to communicate with providers for non-urgent requests or questions.  Due to long hold times on the telephone, sending your provider a message by [facility] may be a faster and more efficient way to get a response.  Please allow 48  business hours for a response.  Please remember that this is for non-urgent requests.  _______________________________________________________  Due to recent changes in healthcare laws, you may see the results of your imaging and laboratory studies on MyChart before your provider has had a chance to review them.  We understand that in some cases there may be results that are confusing or concerning to you. Not all laboratory results come back in the same time frame and the provider may be waiting for multiple results in order to interpret others.  Please give [place] 48 hours in order for your provider to thoroughly review all the results before contacting the office for clarification of your results.   Thank you for entrusting me with your care and choosing [facility].  Dr Doy Hutching

## 2023-07-19 ENCOUNTER — Ambulatory Visit: Payer: Medicaid Other | Admitting: Student

## 2023-07-19 ENCOUNTER — Encounter: Payer: Self-pay | Admitting: Student

## 2023-07-19 VITALS — BP 99/76 | HR 82 | Ht 60.0 in | Wt 140.8 lb

## 2023-07-19 DIAGNOSIS — M25552 Pain in left hip: Secondary | ICD-10-CM | POA: Diagnosis not present

## 2023-07-19 DIAGNOSIS — R9389 Abnormal findings on diagnostic imaging of other specified body structures: Secondary | ICD-10-CM

## 2023-07-19 DIAGNOSIS — J189 Pneumonia, unspecified organism: Secondary | ICD-10-CM | POA: Diagnosis not present

## 2023-07-19 NOTE — Progress Notes (Signed)
    SUBJECTIVE:   CHIEF COMPLAINT / HPI:   Left hip pain Patient continues to have left hip pain, but is improving. She is currently seeing physical therapy, which is helped.  She notes that her hip abductors are very weak, and her pain is exacerbated during physical therapy.  She is not taking Mobic due to dyspepsia symptoms-reports she has a EGD with GI relatively soon.  Agree with avoiding NSAIDs.  At this time, she does not want corticosteroid injection for bursitis.  After further discussion, she would like a referral to sports medicine.  Multifocal pneumonia  abnormal finding on CT 06/29/2023 was seen in the ED had CT angio chest that showed multifocal pneumonia with soft tissue density of the mediastinum that could represent conglomerate of lymphadenopathy and splenomegaly-radiology recommended follow-up CT abdomen with contrast.  Insurance required prior authorization-this has been improved after Dr. Melissa Noon had a peer to peer.  She has not yet been scheduled for CT.  She has a lingering cough, however her symptoms of pneumonia have resolved.  OBJECTIVE:   BP 99/76   Pulse 82   Ht 5' (1.524 m)   Wt 140 lb 12.8 oz (63.9 kg)   LMP 06/25/2023 (Exact Date)   SpO2 100%   BMI 27.50 kg/m    General: NAD, pleasant, Cardio: RRR, no MRG. Cap Refill <2s. Respiratory: Mild coarse breath sounds that improved with cough bilaterally, normal wob on RA GI: Abdomen is soft, not tender, not distended. BS present Skin: Warm and dry  ASSESSMENT/PLAN:   Assessment & Plan Left hip pain Improving.  Hip exam deferred, as patient reports improved pain and declines further intervention at this time.  She will continue with PT. Still suspect greater trochanter bursitis secondary to weak hip abductors. Patient agreeable to sports medicine referral.  Avoid NSAIDs due to GERD-has EGD scheduled with  GI. - Continue physical therapy - Ambulatory referral to sports medicine, would maybe consider  corticosteroid injections if not improving with PT - Follow-up as needed Multifocal pneumonia Resolved.  Residual cough, improving. - Continue to monitor Abnormal finding on imaging Soft tissue density of the mediastinum that could represent conglomerate lymphadenopathy with limited evaluation due to timing of contrast-radiology recommending CT abdomen pelvis with intravenous contrast for further evaluation. - Follow-up CT abdomen pelvis   Tiffany Kocher, DO [facility]

## 2023-07-19 NOTE — Assessment & Plan Note (Signed)
Resolved.  Residual cough, improving. - Continue to monitor

## 2023-07-19 NOTE — Assessment & Plan Note (Signed)
Soft tissue density of the mediastinum that could represent conglomerate lymphadenopathy with limited evaluation due to timing of contrast-radiology recommending CT abdomen pelvis with intravenous contrast for further evaluation. - Follow-up CT abdomen pelvis

## 2023-07-19 NOTE — Patient Instructions (Signed)
It was great to see you! Thank you for allowing me to participate in your care!   I recommend that you always bring your medications to each appointment as this makes it easy to ensure we are on the correct medications and helps [place] not miss when refills are needed.  Our plans for today:  - I have sent a referral to sports medicine. They will call you to schedule an appointment. - We will follow-up after CT scan is completed  Take care and seek immediate care sooner if you develop any concerns. Please remember to show up 15 minutes before your scheduled appointment time!  Tiffany Kocher, DO [facility]

## 2023-07-22 ENCOUNTER — Ambulatory Visit: Payer: Medicaid Other

## 2023-07-22 DIAGNOSIS — M25552 Pain in left hip: Secondary | ICD-10-CM

## 2023-07-22 DIAGNOSIS — M6281 Muscle weakness (generalized): Secondary | ICD-10-CM

## 2023-07-22 NOTE — Therapy (Signed)
OUTPATIENT PHYSICAL THERAPY TREATMENT NOTE   Patient Name: Morgan [name] MRN: [id number] DOB:1985-10-17, 38 y.o., female Today's Date: 07/22/2023  END OF SESSION:  PT End of Session - 07/22/23 0926     Visit Number 13    Number of Visits 17    Date for PT Re-Evaluation 08/12/23    Authorization Type [employer]    Authorization Time Period 7 visits 07/21/23-09/18/23    Authorization - Visit Number 1    Authorization - Number of Visits 7    PT Start Time 0918    PT Stop Time 0956    PT Time Calculation (min) 38 min    Activity Tolerance Patient limited by pain;Patient tolerated treatment well    Behavior During Therapy [facility] for tasks assessed/performed               Past Medical History:  Diagnosis Date   Anemia    Back pain    Complication of anesthesia    Patient states when given the epidural the numbness went up to her nose.    Diabetes mellitus without complication (HCC)    Edema    GERD (gastroesophageal reflux disease)    Heartburn    Hepatitis    Hepatitis B carrier (HCC)    IBS (irritable bowel syndrome)    Obesity    Osteoarthritis    Palpitations    Pneumonia    Vitamin D deficiency    Past Surgical History:  Procedure Laterality Date   CARPAL TUNNEL RELEASE     gastric balloon  01/28/2021   ocular muscle corrective surgery     SLEEVE GASTROPLASTY  2022   Patient Active Problem List   Diagnosis Date Noted   Abnormal finding on imaging 07/14/2023   Multifocal pneumonia 07/06/2023   Leukopenia 06/01/2023   Other fatigue 10/07/2022   SOBOE (shortness of breath on exertion) 10/07/2022   Depression 10/07/2022   Gastroesophageal reflux disease 10/07/2022   Depression screen 10/07/2022   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   Gastroesophageal reflux disease without esophagitis 08/26/2022   Tachycardia 07/02/2022   Pain in both feet 05/07/2021   Fatigue due to excessive exertion 05/07/2021   Carpal tunnel syndrome, bilateral  10/12/2019   Chronic low back pain with right-sided sciatica 10/12/2019   Pes planus 10/12/2019   Vitamin D deficiency 10/12/2019   Healthcare maintenance 10/12/2019   Screening for hyperlipidemia 10/12/2019   Diabetes mellitus screening 10/12/2019   Heart palpitations 10/25/2018   IBS (irritable bowel syndrome) 02/11/2014   Iron deficiency anemia 01/05/2014   History of hepatitis B 12/11/2013   Generalized obesity with starting BMI 30 12/11/2013   Hyperprolactinemia (HCC) 06/01/2013    PCP: Tiffany Kocher, DO  REFERRING PROVIDER: Nestor Ramp, MD  REFERRING DIAG: Left hip pain [M25.552]   THERAPY DIAG:  Pain in left hip  Muscle weakness (generalized)  Rationale for Evaluation and Treatment: Rehabilitation  ONSET DATE: 3+ months   SUBJECTIVE:   SUBJECTIVE STATEMENT: Patient reports that she is getting a referral for sports medicine to address pain management. She feels that she is continuing to make progress with PT.    PERTINENT HISTORY: Relevant PMHx includes Chronic LBP with Rt-sided sciatica, DM, Hep B, IBS, OA, DOE, Depression, Pes planus   PAIN:  Are you having pain? Yes: NPRS scale: 9-10/10 Pain location: hip, LE  Pain description: "It feel very deep, like stabbing with a rod into my hip and radiating through my femur"  Aggravating factors: prolonged  sitting, prolonged standing  Relieving factors: medicine  PRECAUTIONS: None  RED FLAGS: None   WEIGHT BEARING RESTRICTIONS: No  FALLS:  Has patient fallen in last 6 months? 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   NEXT MD VISIT: 06/08/2023 with Dr. Claudean Severance, DO  OBJECTIVE:  Note: Objective measures were completed at Evaluation unless otherwise noted.  DIAGNOSTIC FINDINGS:   05/16/23: DG Hips bilat w or w/o pelvis    IMPRESSION: 1. No acute fracture or dislocation. 2. Mild levocurvature of the lumbar spine with left greater than right L5-S1 disc space narrowing.  PATIENT SURVEYS:  FOTO 29 current, 56 predicted  COGNITION: Overall cognitive status: Within functional limits for tasks assessed     SENSATION: WFL   POSTURE: No Significant postural limitations   LOWER EXTREMITY ROM: grossly WFL, however pain reported as below  Passive ROM Right eval Left eval  Hip flexion  P!  Hip extension    Hip abduction  P!  Hip adduction    Hip internal rotation  P!  Hip external rotation P! P!  Knee flexion    Knee extension    Ankle dorsiflexion    Ankle plantarflexion    Ankle inversion    Ankle eversion     (Blank rows = not tested)  LOWER EXTREMITY MMT:  MMT Right eval Left eval Right 07/15/23 Left 07/15/23  Hip flexion 4- 3+ 4 4-  Hip extension 4- 3- 4 3  Hip abduction 4 4- 4+ 4-  Hip adduction      Hip internal rotation 3- 2 4- 3+  Hip external rotation 3- 2 4- 3+  Knee flexion 4- 3+ 4+ 4+  Knee extension 4- 3+ 4+ 4  Ankle dorsiflexion      Ankle plantarflexion      Ankle inversion      Ankle eversion       (Blank rows = not tested)  LOWER EXTREMITY SPECIAL TESTS:  LEFT Hip special tests: Luisa Hart (FABER) test: positive , Anterior hip impingement test: positive , and Piriformis test: positive   RIGHT  Hip special tests: Luisa Hart (FABER) test: positive , Anterior hip impingement test: negative, and Piriformis test: negative   GAIT: Distance walked: 50 ft Assistive device utilized: None Level of assistance: Complete Independence Comments: no significant gait deviations noted at time of evaluation    TODAY'S TREATMENT:     [facility] Adult PT Treatment:                                                DATE: 07/22/23  Therapeutic Exercise: Nustep level 5 x 5 mins while gathering subjective info Standing hip 3-way from 2" step, 2 x 5 each LE  Standing heel-toe raises, 2 x  10 STS on airex pad, 2 x 10  Bridges with calf on pball 2x10 Supine knee fall outs with GTB, 2 x 10 each  Supine marching "up, up, down, down"  x 10  DKTC with pball x 15 LTR with pball x 10   [facility] Adult PT Treatment:  DATE: 07/15/23  Therapeutic Exercise: Nustep level 3 x 5 mins while gathering subjective info Supine SLR 2 x 10 BIL S/L hip abduction 2 x10 BIL Prone hip extension with knee bent 2 x 10  DKTC with pball x 20 LTR with pball x 10 Bridges with calf on pball 4x5  Supine knee fall outs with RTB, 2 x 10 each  Supine marching "up, up, down, down"  x 12   Therapeutic Activity:  Reassessment of objective measures and subjective assessment regarding progress towards established goals and plan for updated POC    [facility] Adult PT Treatment:                                                DATE: 07/13/23 Therapeutic Exercise: Nustep level 5 x 5 mins while gathering subjective info Supine SLR 2 x 10, 2# ankle weight  BIL S/L hip abduction x10, 2# ankle weight BIL Prone hip extension with knee bent 2 x 10  DKTC with pball x 25  LTR with pball x 20  Bridges with heels on pball 2x8 (cramp in hamstrings on 2nd set) S/L Clamshells GTB, x 10 BIL Supine marching green TB 2 x 10 (each)    [facility] Adult PT Treatment:                                                DATE: 07/08/2023  Therapeutic Exercise: Nustep level 5 x 6 mins while gathering subjective info Supine SLR 2 x 8, 2# ankle weight  S/L hip abduction 2 x10, 2# ankle weight  Prone hip extension with knee 2 x 10   Therapeutic Activity:  Reassessment of objective measures and subjective assessment regarding progress towards established goals and updated HEP   Manual Therapy  STM along hip abductor mm group       PATIENT EDUCATION:  Education details: reviewed initial home exercise program; discussion of POC, prognosis and goals for skilled PT   Person educated:  Patient Education method: Explanation, Demonstration, and Handouts Education comprehension: verbalized understanding, returned demonstration, and needs further education  HOME EXERCISE PROGRAM: Access Code: [email or web address] URL: [email or web address] Date: 07/15/2023 Prepared by: Mauri Reading  Exercises - Clamshell with Resistance  - 1 x daily - 3 x weekly - 20 reps - Supine March with Resistance Band  - 1 x daily - 3 x weekly - 20 reps - Supine Active Straight Leg Raise  - 1 x daily - 3 x weekly - 20 reps - Sidelying Hip Abduction  - 1 x daily - 3 x weekly - 20 reps - Prone Hip Extension with Bent Knee  - 1 x daily - 3 x weekly - 20 reps - Supine Double Leg Bridge on Box  - 1 x daily - 3 x weekly - 20 reps  CLINICAL IMPRESSION: Patient tolerated progression of strengthening exercises well today. She reports some knee pain with STS activity, but denies any worsening of hip pain with standing exercises.  We will continue to progress per POC as tolerated.      OBJECTIVE IMPAIRMENTS: decreased activity tolerance, decreased endurance, difficulty walking, decreased strength, and pain.   ACTIVITY LIMITATIONS: bending, squatting, sleeping, bed mobility, and caring for others  PARTICIPATION LIMITATIONS: meal  prep, cleaning, laundry, interpersonal relationship, driving, community activity, and occupation  PERSONAL FACTORS: Fitness, Past/current experiences, Profession, Time since onset of injury/illness/exacerbation, and 3+ comorbidities: Relevant PMHx includes Chronic LBP with Rt-sided sciatica, DM, Hep B, IBS, OA, DOE, Depression, Pes planus  are also affecting patient's functional outcome.   REHAB POTENTIAL: Fair    CLINICAL DECISION MAKING: Evolving/moderate complexity  EVALUATION COMPLEXITY: Moderate   GOALS: Goals reviewed with patient? Yes  SHORT TERM GOALS: Target date: 06/22/2023   Patient will be independent with initial home program for hip strengthening program.   Baseline: provided at eval  Goal status: MET Pt reports adherence 06/22/23   LONG TERM GOALS: Target date: 08/12/2023, last updated 07/15/23   Patient will report improved overall functional ability with FOTO score of 50 or greater.   Baseline: 29  07/08/23: 52 Goal status: MET  2.  Patient will report ability to perform kneel to stand with no more than 3/10 pain in order to perform her daily prayer.  Baseline: moderate-to-severe pain  07/08/2023: 5/10 Goal status: Ongoing  3.  Patient will demonstrate ability to perform floor-to-counter lifting of at least 20# in order to improve tolerance of normal household chores.  Baseline: unable 07/08/23: 4-5/10 pain occasional with picking up 4 y.o. son Goal status: Ongoing  4.  Patient will demonstrate at least 4/5 MMT with BIL LE strength testing.  Baseline: see objective measures  07/15/23: see objective measures  Goal status: Progressing  5.  Patient will demonstrate ability to begin regular exercise program with frequency of at least 2-3x/week without exacerbation of symptoms.  Baseline: 0x/week  07/08/23: 3x/week walking (30-45 minutes)  Goal status: MET  6.  Patient will report ability to sleep through the night without waking from pain at least 5 days/week.  Baseline: waking up daily d/t pain  07/08/23: walking up "less frequently"  Goal status: Ongoing   PLAN:  PT FREQUENCY: 1-2x/week  PT DURATION: 4 weeks, last updated 07/15/23  PLANNED INTERVENTIONS: 97164- PT Re-evaluation, 97110-Therapeutic exercises, 97530- Therapeutic activity, 97112- Neuromuscular re-education, 97535- Self Care, 16109- Manual therapy, Patient/Family education, Taping, Dry Needling, Joint mobilization, Joint manipulation, Spinal manipulation, Spinal mobilization, Cryotherapy, and Moist heat  PLAN FOR NEXT SESSION: hip joint mobilization, STM and TPDN as indicated, modalities as appropriate, low-impact aerobic activity for symptom modulation, LE  strengthening program, updated HEP and promote independence    Mauri Reading, PT, DPT  07/22/2023 10:00 AM

## 2023-07-24 DIAGNOSIS — A048 Other specified bacterial intestinal infections: Secondary | ICD-10-CM

## 2023-07-24 HISTORY — DX: Other specified bacterial intestinal infections: A04.8

## 2023-07-26 ENCOUNTER — Ambulatory Visit: Payer: Medicaid Other

## 2023-07-27 ENCOUNTER — Encounter: Payer: Self-pay | Admitting: Student

## 2023-07-28 ENCOUNTER — Ambulatory Visit: Payer: Medicaid Other | Attending: Family Medicine

## 2023-07-28 DIAGNOSIS — M25552 Pain in left hip: Secondary | ICD-10-CM | POA: Diagnosis not present

## 2023-07-28 DIAGNOSIS — M6281 Muscle weakness (generalized): Secondary | ICD-10-CM | POA: Insufficient documentation

## 2023-07-28 NOTE — Therapy (Signed)
 OUTPATIENT PHYSICAL THERAPY TREATMENT NOTE   Patient Name: Morgan [name] MRN: [id number] DOB:1985-10-29, 38 y.o., female Today's Date: 07/28/2023  END OF SESSION:  PT End of Session - 07/28/23 0920     Visit Number 14    Number of Visits 17    Date for PT Re-Evaluation 08/12/23    Authorization Type [employer]    Authorization Time Period 7 visits 07/21/23-09/18/23    Authorization - Visit Number 2    Authorization - Number of Visits 7    PT Start Time 0917    PT Stop Time 0950    PT Time Calculation (min) 33 min    Activity Tolerance Patient limited by pain;Patient tolerated treatment well    Behavior During Therapy [facility] for tasks assessed/performed                Past Medical History:  Diagnosis Date   Anemia    Back pain    Complication of anesthesia    Patient states when given the epidural the numbness went up to her nose.    Diabetes mellitus without complication (HCC)    Edema    GERD (gastroesophageal reflux disease)    Heartburn    Hepatitis    Hepatitis B carrier (HCC)    IBS (irritable bowel syndrome)    Obesity    Osteoarthritis    Palpitations    Pneumonia    Vitamin D  deficiency    Past Surgical History:  Procedure Laterality Date   CARPAL TUNNEL RELEASE     gastric balloon  01/28/2021   ocular muscle corrective surgery     SLEEVE GASTROPLASTY  2022   Patient Active Problem List   Diagnosis Date Noted   Abnormal finding on imaging 07/14/2023   Multifocal pneumonia 07/06/2023   Leukopenia 06/01/2023   Other fatigue 10/07/2022   SOBOE (shortness of breath on exertion) 10/07/2022   Depression 10/07/2022   Gastroesophageal reflux disease 10/07/2022   Depression screen 10/07/2022   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   Gastroesophageal reflux disease without esophagitis 08/26/2022   Tachycardia 07/02/2022   Pain in both feet 05/07/2021   Fatigue due to excessive exertion 05/07/2021   Carpal tunnel syndrome, bilateral  10/12/2019   Chronic low back pain with right-sided sciatica 10/12/2019   Pes planus 10/12/2019   Vitamin D  deficiency 10/12/2019   Healthcare maintenance 10/12/2019   Screening for hyperlipidemia 10/12/2019   Diabetes mellitus screening 10/12/2019   Heart palpitations 10/25/2018   IBS (irritable bowel syndrome) 02/11/2014   Iron deficiency anemia 01/05/2014   History of hepatitis B 12/11/2013   Generalized obesity with starting BMI 30 12/11/2013   Hyperprolactinemia (HCC) 06/01/2013    PCP: Howell Lunger, DO  REFERRING PROVIDER: Rosalynn Camie CROME, MD  REFERRING DIAG: Left hip pain [M25.552]   THERAPY DIAG:  Pain in left hip  Muscle weakness (generalized)  Rationale for Evaluation and Treatment: Rehabilitation  ONSET DATE: 3+ months   SUBJECTIVE:   SUBJECTIVE STATEMENT: Patient reports to PT with 4/10 pain today. She has not yet heard from sports medicine to schedule an appointment.    PERTINENT HISTORY: Relevant PMHx includes Chronic LBP with Rt-sided sciatica, DM, Hep B, IBS, OA, DOE, Depression, Pes planus   PAIN:  Are you having pain? Yes: NPRS scale: 9-10/10 Pain location: hip, LE  Pain description: It feel very deep, like stabbing with a rod into my hip and radiating through my femur  Aggravating factors: prolonged sitting, prolonged standing  Relieving  factors: medicine  PRECAUTIONS: None  RED FLAGS: None   WEIGHT BEARING RESTRICTIONS: No  FALLS:  Has patient fallen in last 6 months? 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   NEXT MD VISIT: 06/08/2023 with Dr. Howell, DO  OBJECTIVE:  Note: Objective measures were completed at Evaluation unless otherwise noted.  DIAGNOSTIC FINDINGS:   05/16/23: DG Hips bilat w or w/o pelvis   IMPRESSION: 1. No acute fracture or  dislocation. 2. Mild levocurvature of the lumbar spine with left greater than right L5-S1 disc space narrowing.  PATIENT SURVEYS:  FOTO 29 current, 56 predicted  COGNITION: Overall cognitive status: Within functional limits for tasks assessed     SENSATION: WFL   POSTURE: No Significant postural limitations   LOWER EXTREMITY ROM: grossly WFL, however pain reported as below  Passive ROM Right eval Left eval  Hip flexion  P!  Hip extension    Hip abduction  P!  Hip adduction    Hip internal rotation  P!  Hip external rotation P! P!  Knee flexion    Knee extension    Ankle dorsiflexion    Ankle plantarflexion    Ankle inversion    Ankle eversion     (Blank rows = not tested)  LOWER EXTREMITY MMT:  MMT Right eval Left eval Right 07/15/23 Left 07/15/23  Hip flexion 4- 3+ 4 4-  Hip extension 4- 3- 4 3  Hip abduction 4 4- 4+ 4-  Hip adduction      Hip internal rotation 3- 2 4- 3+  Hip external rotation 3- 2 4- 3+  Knee flexion 4- 3+ 4+ 4+  Knee extension 4- 3+ 4+ 4  Ankle dorsiflexion      Ankle plantarflexion      Ankle inversion      Ankle eversion       (Blank rows = not tested)  LOWER EXTREMITY SPECIAL TESTS:  LEFT Hip special tests: Belvie (FABER) test: positive , Anterior hip impingement test: positive , and Piriformis test: positive   RIGHT  Hip special tests: Belvie (FABER) test: positive , Anterior hip impingement test: negative, and Piriformis test: negative   GAIT: Distance walked: 50 ft Assistive device utilized: None Level of assistance: Complete Independence Comments: no significant gait deviations noted at time of evaluation    TODAY'S TREATMENT:    [facility] Adult PT Treatment:                                                DATE: 07/28/23  Therapeutic Exercise: Nustep level 5 x 5 mins  Bridges with calf on pball 2x10 DKTC with pball x 15 LTR with pball x 10 Supine knee fall outs with blue TB , 2 x 10 each  Supine marching up, up, down,  down  x 10   Therapeutic Activity:  Standing hip 3-way from airex, 2 x 5 each LE  Standing heel-toe raises, 2 x 10 on airex STS on airex pad, 2 x 10  Modified dead lift from wall -> starting position against wall, 5# KB   [facility] Adult PT Treatment:  DATE: 07/22/23  Therapeutic Exercise: Nustep level 5 x 5 mins while gathering subjective info Standing hip 3-way from 2 step, 2 x 5 each LE  Standing heel-toe raises, 2 x 10 STS on airex pad, 2 x 10  Bridges with calf on pball 2x10 Supine knee fall outs with GTB, 2 x 10 each  Supine marching up, up, down, down  x 10  DKTC with pball x 15 LTR with pball x 10   [facility] Adult PT Treatment:                                                DATE: 07/15/23  Therapeutic Exercise: Nustep level 3 x 5 mins while gathering subjective info Supine SLR 2 x 10 BIL S/L hip abduction 2 x10 BIL Prone hip extension with knee bent 2 x 10  DKTC with pball x 20 LTR with pball x 10 Bridges with calf on pball 4x5  Supine knee fall outs with RTB, 2 x 10 each  Supine marching up, up, down, down  x 12   Therapeutic Activity:  Reassessment of objective measures and subjective assessment regarding progress towards established goals and plan for updated POC    [facility] Adult PT Treatment:                                                DATE: 07/13/23 Therapeutic Exercise: Nustep level 5 x 5 mins while gathering subjective info Supine SLR 2 x 10, 2# ankle weight  BIL S/L hip abduction x10, 2# ankle weight BIL Prone hip extension with knee bent 2 x 10  DKTC with pball x 25  LTR with pball x 20  Bridges with heels on pball 2x8 (cramp in hamstrings on 2nd set) S/L Clamshells GTB, x 10 BIL Supine marching green TB 2 x 10 (each)    [facility] Adult PT Treatment:                                                DATE: 07/08/2023  Therapeutic Exercise: Nustep level 5 x 6 mins while gathering subjective info Supine SLR 2 x 8, 2#  ankle weight  S/L hip abduction 2 x10, 2# ankle weight  Prone hip extension with knee 2 x 10   Therapeutic Activity:  Reassessment of objective measures and subjective assessment regarding progress towards established goals and updated HEP   Manual Therapy  STM along hip abductor mm group       PATIENT EDUCATION:  Education details: reviewed initial home exercise program; discussion of POC, prognosis and goals for skilled PT   Person educated: Patient Education method: Explanation, Demonstration, and Handouts Education comprehension: verbalized understanding, returned demonstration, and needs further education  HOME EXERCISE PROGRAM: Access Code: [email or web address] URL: [email or web address] Date: 07/15/2023 Prepared by: Marko Molt  Exercises - Clamshell with Resistance  - 1 x daily - 3 x weekly - 20 reps - Supine March with Resistance Band  - 1 x daily - 3 x weekly - 20 reps - Supine Active Straight Leg Raise  - 1 x daily - 3 x weekly -  20 reps - Sidelying Hip Abduction  - 1 x daily - 3 x weekly - 20 reps - Prone Hip Extension with Bent Knee  - 1 x daily - 3 x weekly - 20 reps - Supine Double Leg Bridge on Box  - 1 x daily - 3 x weekly - 20 reps  CLINICAL IMPRESSION:  Morgan [name] was able to tolerate gradual progress of strengthening exercises including use of foam pad with standing exercises. She continues to be significantly challenged with supine core strengthening exercises. She may benefit from additional time with standing core exercises to address relevant daily activities. Plan is to updated HEP at next visit. We will continue per POC towards established rehab goals.      OBJECTIVE IMPAIRMENTS: decreased activity tolerance, decreased endurance, difficulty walking, decreased strength, and pain.   ACTIVITY LIMITATIONS: bending, squatting, sleeping, bed mobility, and caring for others  PARTICIPATION LIMITATIONS: meal prep, cleaning, laundry, interpersonal  relationship, driving, community activity, and occupation  PERSONAL FACTORS: Fitness, Past/current experiences, Profession, Time since onset of injury/illness/exacerbation, and 3+ comorbidities: Relevant PMHx includes Chronic LBP with Rt-sided sciatica, DM, Hep B, IBS, OA, DOE, Depression, Pes planus  are also affecting patient's functional outcome.   REHAB POTENTIAL: Fair    CLINICAL DECISION MAKING: Evolving/moderate complexity  EVALUATION COMPLEXITY: Moderate   GOALS: Goals reviewed with patient? Yes  SHORT TERM GOALS: Target date: 06/22/2023   Patient will be independent with initial home program for hip strengthening program.  Baseline: provided at eval  Goal status: MET Pt reports adherence 06/22/23   LONG TERM GOALS: Target date: 08/12/2023, last updated 07/15/23   Patient will report improved overall functional ability with FOTO score of 50 or greater.   Baseline: 29  07/08/23: 52 Goal status: MET  2.  Patient will report ability to perform kneel to stand with no more than 3/10 pain in order to perform her daily prayer.  Baseline: moderate-to-severe pain  07/08/2023: 5/10 Goal status: Ongoing  3.  Patient will demonstrate ability to perform floor-to-counter lifting of at least 20# in order to improve tolerance of normal household chores.  Baseline: unable 07/08/23: 4-5/10 pain occasional with picking up 4 y.o. son Goal status: Ongoing  4.  Patient will demonstrate at least 4/5 MMT with BIL LE strength testing.  Baseline: see objective measures  07/15/23: see objective measures  Goal status: Progressing  5.  Patient will demonstrate ability to begin regular exercise program with frequency of at least 2-3x/week without exacerbation of symptoms.  Baseline: 0x/week  07/08/23: 3x/week walking (30-45 minutes)  Goal status: MET  6.  Patient will report ability to sleep through the night without waking from pain at least 5 days/week.  Baseline: waking up daily d/t pain   07/08/23: walking up less frequently  Goal status: Ongoing   PLAN:  PT FREQUENCY: 1-2x/week  PT DURATION: 4 weeks, last updated 07/15/23  PLANNED INTERVENTIONS: 97164- PT Re-evaluation, 97110-Therapeutic exercises, 97530- Therapeutic activity, 97112- Neuromuscular re-education, 97535- Self Care, [id number]- Manual therapy, Patient/Family education, Taping, Dry Needling, Joint mobilization, Joint manipulation, Spinal manipulation, Spinal mobilization, Cryotherapy, and Moist heat  PLAN FOR NEXT SESSION: hip joint mobilization, STM and TPDN as indicated, modalities as appropriate, low-impact aerobic activity for symptom modulation, LE strengthening program, updated HEP and promote independence    Marko Molt, PT, DPT  07/28/2023 9:53 AM

## 2023-07-29 ENCOUNTER — Ambulatory Visit: Payer: Medicaid Other

## 2023-07-29 DIAGNOSIS — M6281 Muscle weakness (generalized): Secondary | ICD-10-CM

## 2023-07-29 DIAGNOSIS — M25552 Pain in left hip: Secondary | ICD-10-CM

## 2023-07-29 NOTE — Therapy (Signed)
 OUTPATIENT PHYSICAL THERAPY TREATMENT NOTE   Patient Name: Morgan [name] MRN: [id number] DOB:01-19-1986, 38 y.o., female Today's Date: 07/29/2023  END OF SESSION:  PT End of Session - 07/29/23 1222     Visit Number 15    Number of Visits 17    Date for PT Re-Evaluation 08/12/23    Authorization Type [employer]    Authorization Time Period 7 visits 07/21/23-09/18/23    Authorization - Visit Number 3    Authorization - Number of Visits 7    PT Start Time 1218    PT Stop Time 1256    PT Time Calculation (min) 38 min    Activity Tolerance Patient limited by pain;Patient tolerated treatment well    Behavior During Therapy [facility] for tasks assessed/performed                 Past Medical History:  Diagnosis Date   Anemia    Back pain    Complication of anesthesia    Patient states when given the epidural the numbness went up to her nose.    Diabetes mellitus without complication (HCC)    Edema    GERD (gastroesophageal reflux disease)    Heartburn    Hepatitis    Hepatitis B carrier (HCC)    IBS (irritable bowel syndrome)    Obesity    Osteoarthritis    Palpitations    Pneumonia    Vitamin D  deficiency    Past Surgical History:  Procedure Laterality Date   CARPAL TUNNEL RELEASE     gastric balloon  01/28/2021   ocular muscle corrective surgery     SLEEVE GASTROPLASTY  2022   Patient Active Problem List   Diagnosis Date Noted   Abnormal finding on imaging 07/14/2023   Multifocal pneumonia 07/06/2023   Leukopenia 06/01/2023   Other fatigue 10/07/2022   SOBOE (shortness of breath on exertion) 10/07/2022   Depression 10/07/2022   Gastroesophageal reflux disease 10/07/2022   Depression screen 10/07/2022   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   Gastroesophageal reflux disease without esophagitis 08/26/2022   Tachycardia 07/02/2022   Pain in both feet 05/07/2021   Fatigue due to excessive exertion 05/07/2021   Carpal tunnel syndrome,  bilateral 10/12/2019   Chronic low back pain with right-sided sciatica 10/12/2019   Pes planus 10/12/2019   Vitamin D  deficiency 10/12/2019   Healthcare maintenance 10/12/2019   Screening for hyperlipidemia 10/12/2019   Diabetes mellitus screening 10/12/2019   Heart palpitations 10/25/2018   IBS (irritable bowel syndrome) 02/11/2014   Iron deficiency anemia 01/05/2014   History of hepatitis B 12/11/2013   Generalized obesity with starting BMI 30 12/11/2013   Hyperprolactinemia (HCC) 06/01/2013    PCP: Howell Lunger, DO  REFERRING PROVIDER: Rosalynn Camie CROME, MD  REFERRING DIAG: Left hip pain [M25.552]   THERAPY DIAG:  Muscle weakness (generalized)  Pain in left hip  Rationale for Evaluation and Treatment: Rehabilitation  ONSET DATE: 3+ months   SUBJECTIVE:   SUBJECTIVE STATEMENT: Patient reports that she has some soreness in her hip after yesterday's session.    PERTINENT HISTORY: Relevant PMHx includes Chronic LBP with Rt-sided sciatica, DM, Hep B, IBS, OA, DOE, Depression, Pes planus   PAIN:  Are you having pain? Yes: NPRS scale: 9-10/10 Pain location: hip, LE  Pain description: It feel very deep, like stabbing with a rod into my hip and radiating through my femur  Aggravating factors: prolonged sitting, prolonged standing  Relieving factors: medicine  PRECAUTIONS: None  RED FLAGS: None   WEIGHT BEARING RESTRICTIONS: No  FALLS:  Has patient fallen in last 6 months? 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   NEXT MD VISIT: 06/08/2023 with Dr. Howell, DO  OBJECTIVE:  Note: Objective measures were completed at Evaluation unless otherwise noted.  DIAGNOSTIC FINDINGS:   05/16/23: DG Hips bilat w or w/o pelvis   IMPRESSION: 1. No acute fracture or dislocation. 2. Mild levocurvature of  the lumbar spine with left greater than right L5-S1 disc space narrowing.  PATIENT SURVEYS:  FOTO 29 current, 56 predicted  COGNITION: Overall cognitive status: Within functional limits for tasks assessed     SENSATION: WFL   POSTURE: No Significant postural limitations   LOWER EXTREMITY ROM: grossly WFL, however pain reported as below  Passive ROM Right eval Left eval  Hip flexion  P!  Hip extension    Hip abduction  P!  Hip adduction    Hip internal rotation  P!  Hip external rotation P! P!  Knee flexion    Knee extension    Ankle dorsiflexion    Ankle plantarflexion    Ankle inversion    Ankle eversion     (Blank rows = not tested)  LOWER EXTREMITY MMT:  MMT Right eval Left eval Right 07/15/23 Left 07/15/23  Hip flexion 4- 3+ 4 4-  Hip extension 4- 3- 4 3  Hip abduction 4 4- 4+ 4-  Hip adduction      Hip internal rotation 3- 2 4- 3+  Hip external rotation 3- 2 4- 3+  Knee flexion 4- 3+ 4+ 4+  Knee extension 4- 3+ 4+ 4  Ankle dorsiflexion      Ankle plantarflexion      Ankle inversion      Ankle eversion       (Blank rows = not tested)  LOWER EXTREMITY SPECIAL TESTS:  LEFT Hip special tests: Belvie (FABER) test: positive , Anterior hip impingement test: positive , and Piriformis test: positive   RIGHT  Hip special tests: Belvie (FABER) test: positive , Anterior hip impingement test: negative, and Piriformis test: negative   GAIT: Distance walked: 50 ft Assistive device utilized: None Level of assistance: Complete Independence Comments: no significant gait deviations noted at time of evaluation    TODAY'S TREATMENT:    [facility] Adult PT Treatment:                                                DATE: 07/29/23  Therapeutic Exercise: Nustep level 3 x 8 mins  Bridges with calf on pball x15, hold 5 sec, 1# ankle weight  DKTC with pball x 20, 1# ankle weight  LTR with pball x 10, 1# ankle weight Supine marching up, up, down, down 2 x 10, 1# ankle  weight    Therapeutic Activity:  Standing hip 3-way from airex, 2 x 5 each LE, 1# ankle weight   Modified dead lift from wall, 0#, 2 x 10   [facility] Adult PT Treatment:  DATE: 07/28/23  Therapeutic Exercise: Nustep level 5 x 5 mins  Bridges with calf on pball 2x10 DKTC with pball x 15 LTR with pball x 10 Supine knee fall outs with blue TB , 2 x 10 each  Supine marching up, up, down, down  x 10   Therapeutic Activity:  Standing hip 3-way from airex, 2 x 5 each LE  Standing heel-toe raises, 2 x 10 on airex STS on airex pad, 2 x 10  Modified dead lift from wall -> starting position against wall, 5# KB   [facility] Adult PT Treatment:                                                DATE: 07/22/23  Therapeutic Exercise: Nustep level 5 x 5 mins while gathering subjective info Standing hip 3-way from 2 step, 2 x 5 each LE  Standing heel-toe raises, 2 x 10 STS on airex pad, 2 x 10  Bridges with calf on pball 2x10 Supine knee fall outs with GTB, 2 x 10 each  Supine marching up, up, down, down  x 10  DKTC with pball x 15 LTR with pball x 10   [facility] Adult PT Treatment:                                                DATE: 07/15/23  Therapeutic Exercise: Nustep level 3 x 5 mins while gathering subjective info Supine SLR 2 x 10 BIL S/L hip abduction 2 x10 BIL Prone hip extension with knee bent 2 x 10  DKTC with pball x 20 LTR with pball x 10 Bridges with calf on pball 4x5  Supine knee fall outs with RTB, 2 x 10 each  Supine marching up, up, down, down  x 12   Therapeutic Activity:  Reassessment of objective measures and subjective assessment regarding progress towards established goals and plan for updated POC    [facility] Adult PT Treatment:                                                DATE: 07/13/23 Therapeutic Exercise: Nustep level 5 x 5 mins while gathering subjective info Supine SLR 2 x 10, 2# ankle weight  BIL S/L hip abduction x10,  2# ankle weight BIL Prone hip extension with knee bent 2 x 10  DKTC with pball x 25  LTR with pball x 20  Bridges with heels on pball 2x8 (cramp in hamstrings on 2nd set) S/L Clamshells GTB, x 10 BIL Supine marching green TB 2 x 10 (each)    [facility] Adult PT Treatment:                                                DATE: 07/08/2023  Therapeutic Exercise: Nustep level 5 x 6 mins while gathering subjective info Supine SLR 2 x 8, 2# ankle weight  S/L hip abduction 2 x10, 2# ankle weight  Prone hip extension with  knee 2 x 10   Therapeutic Activity:  Reassessment of objective measures and subjective assessment regarding progress towards established goals and updated HEP   Manual Therapy  STM along hip abductor mm group      PATIENT EDUCATION:  Education details: reviewed initial home exercise program; discussion of POC, prognosis and goals for skilled PT   Person educated: Patient Education method: Explanation, Demonstration, and Handouts Education comprehension: verbalized understanding, returned demonstration, and needs further education  HOME EXERCISE PROGRAM: Access Code: [email or web address] URL: [email or web address] Date: 07/29/2023 Prepared by: Marko Molt  Exercises - Clamshell with Resistance  - 1 x daily - 3 x weekly - 20 reps - Supine March with Resistance Band  - 1 x daily - 3 x weekly - 20 reps - Supine Double Leg Bridge on Box  - 1 x daily - 3 x weekly - 20 reps - Half Deadlift with Kettlebell  - 1 x daily - 3 x weekly - 2 sets - 10 reps - 3 sec hold - Standing 3-way Hip with Walker  - 1 x daily - 3 x weekly - 2 sets - 10 reps  CLINICAL IMPRESSION:  Patient was somewhat limited by soreness today. However, she was able to perform exercises with additional resistance from 1# ankle weight, and modified deadlifts with improved mechanics today. We will continue to progress core mm strengthening, including BIL hip strengthening. Provided patient with updated HEP  today. We will continue per POC towards established rehab goals.      OBJECTIVE IMPAIRMENTS: decreased activity tolerance, decreased endurance, difficulty walking, decreased strength, and pain.   ACTIVITY LIMITATIONS: bending, squatting, sleeping, bed mobility, and caring for others  PARTICIPATION LIMITATIONS: meal prep, cleaning, laundry, interpersonal relationship, driving, community activity, and occupation  PERSONAL FACTORS: Fitness, Past/current experiences, Profession, Time since onset of injury/illness/exacerbation, and 3+ comorbidities: Relevant PMHx includes Chronic LBP with Rt-sided sciatica, DM, Hep B, IBS, OA, DOE, Depression, Pes planus  are also affecting patient's functional outcome.   REHAB POTENTIAL: Fair    CLINICAL DECISION MAKING: Evolving/moderate complexity  EVALUATION COMPLEXITY: Moderate   GOALS: Goals reviewed with patient? Yes  SHORT TERM GOALS: Target date: 06/22/2023   Patient will be independent with initial home program for hip strengthening program.  Baseline: provided at eval  Goal status: MET Pt reports adherence 06/22/23   LONG TERM GOALS: Target date: 08/12/2023, last updated 07/15/23   Patient will report improved overall functional ability with FOTO score of 50 or greater.   Baseline: 29  07/08/23: 52 Goal status: MET  2.  Patient will report ability to perform kneel to stand with no more than 3/10 pain in order to perform her daily prayer.  Baseline: moderate-to-severe pain  07/08/2023: 5/10 Goal status: Ongoing  3.  Patient will demonstrate ability to perform floor-to-counter lifting of at least 20# in order to improve tolerance of normal household chores.  Baseline: unable 07/08/23: 4-5/10 pain occasional with picking up 4 y.o. son Goal status: Ongoing  4.  Patient will demonstrate at least 4/5 MMT with BIL LE strength testing.  Baseline: see objective measures  07/15/23: see objective measures  Goal status: Progressing  5.   Patient will demonstrate ability to begin regular exercise program with frequency of at least 2-3x/week without exacerbation of symptoms.  Baseline: 0x/week  07/08/23: 3x/week walking (30-45 minutes)  Goal status: MET  6.  Patient will report ability to sleep through the night without waking from pain at least 5 days/week.  Baseline: waking up daily  d/t pain  07/08/23: walking up less frequently  Goal status: Ongoing   PLAN:  PT FREQUENCY: 1-2x/week  PT DURATION: 4 weeks, last updated 07/15/23  PLANNED INTERVENTIONS: 97164- PT Re-evaluation, 97110-Therapeutic exercises, 97530- Therapeutic activity, 97112- Neuromuscular re-education, 97535- Self Care, [id number]- Manual therapy, Patient/Family education, Taping, Dry Needling, Joint mobilization, Joint manipulation, Spinal manipulation, Spinal mobilization, Cryotherapy, and Moist heat  PLAN FOR NEXT SESSION: hip joint mobilization, STM and TPDN as indicated, modalities as appropriate, low-impact aerobic activity for symptom modulation, LE strengthening program, updated HEP and promote independence    Marko Molt, PT, DPT  07/29/2023 1:27 PM

## 2023-07-30 ENCOUNTER — Ambulatory Visit (HOSPITAL_COMMUNITY)
Admission: RE | Admit: 2023-07-30 | Discharge: 2023-07-30 | Disposition: A | Discharge status: Home / Self Care | Payer: Medicaid Other | Source: Ambulatory Visit | Attending: Family Medicine

## 2023-07-30 DIAGNOSIS — R9389 Abnormal findings on diagnostic imaging of other specified body structures: Secondary | ICD-10-CM | POA: Insufficient documentation

## 2023-07-30 DIAGNOSIS — Z960 Presence of urogenital implants: Secondary | ICD-10-CM | POA: Diagnosis not present

## 2023-07-30 MED ORDER — IOHEXOL 350 MG/ML SOLN
60.0000 mL | Freq: Once | INTRAVENOUS | Status: AC | PRN
  Administered 2023-07-30: 60 mL via INTRAVENOUS

## 2023-08-02 ENCOUNTER — Encounter: Payer: Self-pay | Admitting: Student

## 2023-08-03 ENCOUNTER — Ambulatory Visit: Payer: Medicaid Other

## 2023-08-03 DIAGNOSIS — M25552 Pain in left hip: Secondary | ICD-10-CM

## 2023-08-03 DIAGNOSIS — M6281 Muscle weakness (generalized): Secondary | ICD-10-CM | POA: Diagnosis not present

## 2023-08-03 NOTE — Therapy (Signed)
OUTPATIENT PHYSICAL THERAPY TREATMENT NOTE   Patient Name: Morgan [name] MRN: [id number] DOB:Nov 15, 1985, 38 y.o., female Today's Date: 08/03/2023  END OF SESSION:  PT End of Session - 08/03/23 1217     Visit Number 16    Number of Visits 17    Date for PT Re-Evaluation 08/12/23    Authorization Type [employer]    Authorization Time Period 7 visits 07/21/23-09/18/23    Authorization - Visit Number 4    Authorization - Number of Visits 7    PT Start Time 1217    PT Stop Time 1255    PT Time Calculation (min) 38 min    Activity Tolerance Patient limited by pain;Patient tolerated treatment well    Behavior During Therapy [facility] for tasks assessed/performed              Past Medical History:  Diagnosis Date   Anemia    Back pain    Complication of anesthesia    Patient states when given the epidural the numbness went up to her nose.    Diabetes mellitus without complication (HCC)    Edema    GERD (gastroesophageal reflux disease)    Heartburn    Hepatitis    Hepatitis B carrier (HCC)    IBS (irritable bowel syndrome)    Obesity    Osteoarthritis    Palpitations    Pneumonia    Vitamin D deficiency    Past Surgical History:  Procedure Laterality Date   CARPAL TUNNEL RELEASE     gastric balloon  01/28/2021   ocular muscle corrective surgery     SLEEVE GASTROPLASTY  2022   Patient Active Problem List   Diagnosis Date Noted   Abnormal finding on imaging 07/14/2023   Multifocal pneumonia 07/06/2023   Leukopenia 06/01/2023   Other fatigue 10/07/2022   SOBOE (shortness of breath on exertion) 10/07/2022   Depression 10/07/2022   Gastroesophageal reflux disease 10/07/2022   Depression screen 10/07/2022   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   Gastroesophageal reflux disease without esophagitis 08/26/2022   Tachycardia 07/02/2022   Pain in both feet 05/07/2021   Fatigue due to excessive exertion 05/07/2021   Carpal tunnel syndrome, bilateral  10/12/2019   Chronic low back pain with right-sided sciatica 10/12/2019   Pes planus 10/12/2019   Vitamin D deficiency 10/12/2019   Healthcare maintenance 10/12/2019   Screening for hyperlipidemia 10/12/2019   Diabetes mellitus screening 10/12/2019   Heart palpitations 10/25/2018   IBS (irritable bowel syndrome) 02/11/2014   Iron deficiency anemia 01/05/2014   History of hepatitis B 12/11/2013   Generalized obesity with starting BMI 30 12/11/2013   Hyperprolactinemia (HCC) 06/01/2013    PCP: Tiffany Kocher, DO  REFERRING PROVIDER: Nestor Ramp, MD  REFERRING DIAG: Left hip pain [M25.552]   THERAPY DIAG:  Muscle weakness (generalized)  Pain in left hip  Rationale for Evaluation and Treatment: Rehabilitation  ONSET DATE: 3+ months   SUBJECTIVE:   SUBJECTIVE STATEMENT:  Patient reports continued soreness in her Lt hip mainly with sleeping.  PERTINENT HISTORY: Relevant PMHx includes Chronic LBP with Rt-sided sciatica, DM, Hep B, IBS, OA, DOE, Depression, Pes planus   PAIN:  Are you having pain? Yes: NPRS scale: 9-10/10 Pain location: hip, LE  Pain description: "It feel very deep, like stabbing with a rod into my hip and radiating through my femur"  Aggravating factors: prolonged sitting, prolonged standing  Relieving factors: medicine  PRECAUTIONS: None  RED FLAGS: None   WEIGHT  BEARING RESTRICTIONS: No  FALLS:  Has patient fallen in last 6 months? 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   NEXT MD VISIT: 06/08/2023 with Dr. Claudean Severance, DO  OBJECTIVE:  Note: Objective measures were completed at Evaluation unless otherwise noted.  DIAGNOSTIC FINDINGS:   05/16/23: DG Hips bilat w or w/o pelvis   IMPRESSION: 1. No acute fracture or dislocation. 2. Mild levocurvature of the lumbar spine with  left greater than right L5-S1 disc space narrowing.  PATIENT SURVEYS:  FOTO 29 current, 56 predicted  COGNITION: Overall cognitive status: Within functional limits for tasks assessed     SENSATION: WFL   POSTURE: No Significant postural limitations   LOWER EXTREMITY ROM: grossly WFL, however pain reported as below  Passive ROM Right eval Left eval  Hip flexion  P!  Hip extension    Hip abduction  P!  Hip adduction    Hip internal rotation  P!  Hip external rotation P! P!  Knee flexion    Knee extension    Ankle dorsiflexion    Ankle plantarflexion    Ankle inversion    Ankle eversion     (Blank rows = not tested)  LOWER EXTREMITY MMT:  MMT Right eval Left eval Right 07/15/23 Left 07/15/23  Hip flexion 4- 3+ 4 4-  Hip extension 4- 3- 4 3  Hip abduction 4 4- 4+ 4-  Hip adduction      Hip internal rotation 3- 2 4- 3+  Hip external rotation 3- 2 4- 3+  Knee flexion 4- 3+ 4+ 4+  Knee extension 4- 3+ 4+ 4  Ankle dorsiflexion      Ankle plantarflexion      Ankle inversion      Ankle eversion       (Blank rows = not tested)  LOWER EXTREMITY SPECIAL TESTS:  LEFT Hip special tests: Luisa Hart (FABER) test: positive , Anterior hip impingement test: positive , and Piriformis test: positive   RIGHT  Hip special tests: Luisa Hart (FABER) test: positive , Anterior hip impingement test: negative, and Piriformis test: negative   GAIT: Distance walked: 50 ft Assistive device utilized: None Level of assistance: Complete Independence Comments: no significant gait deviations noted at time of evaluation    TODAY'S TREATMENT:   [facility] Adult PT Treatment:                                                DATE: 08/03/23 Therapeutic Exercise: Nustep level 3 x 8 mins  Bridges with calf on pball 2x10, hold 5 sec, 1# ankle weight  DKTC with pball x 20, 1# ankle weight  LTR with pball x 10, 1# ankle weight Supine marching "up, up, down, down" 2 x 10, 1# ankle weight  Supine hip  adduction ball squeeze 5" hold 2x10  Therapeutic Activity:  Standing hip 3-way from airex, 2 x 5 each LE, 1# ankle weight   Modified dead lift from wall, 0#, 2 x 10    [facility] Adult PT Treatment:  DATE: 07/29/23  Therapeutic Exercise: Nustep level 3 x 8 mins  Bridges with calf on pball x15, hold 5 sec, 1# ankle weight  DKTC with pball x 20, 1# ankle weight  LTR with pball x 10, 1# ankle weight Supine marching "up, up, down, down" 2 x 10, 1# ankle weight Therapeutic Activity:  Standing hip 3-way from airex, 3 x 5 each LE, 1# ankle weight   Modified dead lift from wall, 0#, 2 x 10   [facility] Adult PT Treatment:                                                DATE: 07/28/23  Therapeutic Exercise: Nustep level 5 x 5 mins  Bridges with calf on pball 2x10 DKTC with pball x 15 LTR with pball x 10 Supine knee fall outs with blue TB , 2 x 10 each  Supine marching "up, up, down, down"  x 10   Therapeutic Activity:  Standing hip 3-way from airex, 2 x 5 each LE  Standing heel-toe raises, 2 x 10 on airex STS on airex pad, 2 x 10  Modified dead lift from wall -> starting position against wall, 5# KB    PATIENT EDUCATION:  Education details: reviewed initial home exercise program; discussion of POC, prognosis and goals for skilled PT   Person educated: Patient Education method: Explanation, Demonstration, and Handouts Education comprehension: verbalized understanding, returned demonstration, and needs further education  HOME EXERCISE PROGRAM: Access Code: [email or web address] URL: [email or web address] Date: 07/29/2023 Prepared by: Mauri Reading  Exercises - Clamshell with Resistance  - 1 x daily - 3 x weekly - 20 reps - Supine March with Resistance Band  - 1 x daily - 3 x weekly - 20 reps - Supine Double Leg Bridge on Box  - 1 x daily - 3 x weekly - 20 reps - Half Deadlift with Kettlebell  - 1 x daily - 3 x weekly - 2 sets - 10 reps - 3 sec  hold - Standing 3-way Hip with Walker  - 1 x daily - 3 x weekly - 2 sets - 10 reps  CLINICAL IMPRESSION: Patient presents to PT reporting continued hip pain and night, especially when she is trying to sleep. Session today continued to focus on proximal hip strengthening with use of increased resistance to good effect. Also continued to work on Geologist, engineering with deadlifting using the wall and no weights. Patient was able to tolerate all prescribed exercises with no adverse effects. Patient continues to benefit from skilled PT services and should be progressed as able to improve functional independence.    OBJECTIVE IMPAIRMENTS: decreased activity tolerance, decreased endurance, difficulty walking, decreased strength, and pain.   ACTIVITY LIMITATIONS: bending, squatting, sleeping, bed mobility, and caring for others  PARTICIPATION LIMITATIONS: meal prep, cleaning, laundry, interpersonal relationship, driving, community activity, and occupation  PERSONAL FACTORS: Fitness, Past/current experiences, Profession, Time since onset of injury/illness/exacerbation, and 3+ comorbidities: Relevant PMHx includes Chronic LBP with Rt-sided sciatica, DM, Hep B, IBS, OA, DOE, Depression, Pes planus  are also affecting patient's functional outcome.   REHAB POTENTIAL: Fair    CLINICAL DECISION MAKING: Evolving/moderate complexity  EVALUATION COMPLEXITY: Moderate   GOALS: Goals reviewed with patient? Yes  SHORT TERM GOALS: Target date: 06/22/2023   Patient will be independent with initial home program for hip strengthening program.  Baseline: provided at  eval  Goal status: MET Pt reports adherence 06/22/23   LONG TERM GOALS: Target date: 08/12/2023, last updated 07/15/23   Patient will report improved overall functional ability with FOTO score of 50 or greater.   Baseline: 29  07/08/23: 52 Goal status: MET  2.  Patient will report ability to perform kneel to stand with no more than 3/10 pain in order  to perform her daily prayer.  Baseline: moderate-to-severe pain  07/08/2023: 5/10 Goal status: Ongoing  3.  Patient will demonstrate ability to perform floor-to-counter lifting of at least 20# in order to improve tolerance of normal household chores.  Baseline: unable 07/08/23: 4-5/10 pain occasional with picking up 4 y.o. son Goal status: Ongoing  4.  Patient will demonstrate at least 4/5 MMT with BIL LE strength testing.  Baseline: see objective measures  07/15/23: see objective measures  Goal status: Progressing  5.  Patient will demonstrate ability to begin regular exercise program with frequency of at least 2-3x/week without exacerbation of symptoms.  Baseline: 0x/week  07/08/23: 3x/week walking (30-45 minutes)  Goal status: MET  6.  Patient will report ability to sleep through the night without waking from pain at least 5 days/week.  Baseline: waking up daily d/t pain  07/08/23: walking up "less frequently"  Goal status: Ongoing   PLAN:  PT FREQUENCY: 1-2x/week  PT DURATION: 4 weeks, last updated 07/15/23  PLANNED INTERVENTIONS: 97164- PT Re-evaluation, 97110-Therapeutic exercises, 97530- Therapeutic activity, 97112- Neuromuscular re-education, 97535- Self Care, 09811- Manual therapy, Patient/Family education, Taping, Dry Needling, Joint mobilization, Joint manipulation, Spinal manipulation, Spinal mobilization, Cryotherapy, and Moist heat  PLAN FOR NEXT SESSION: hip joint mobilization, STM and TPDN as indicated, modalities as appropriate, low-impact aerobic activity for symptom modulation, LE strengthening program, updated HEP and promote independence    Berta Minor PTA  08/03/2023 12:55 PM

## 2023-08-05 ENCOUNTER — Ambulatory Visit: Payer: Medicaid Other

## 2023-08-05 DIAGNOSIS — M25552 Pain in left hip: Secondary | ICD-10-CM | POA: Diagnosis not present

## 2023-08-05 DIAGNOSIS — M6281 Muscle weakness (generalized): Secondary | ICD-10-CM

## 2023-08-05 NOTE — Therapy (Signed)
OUTPATIENT PHYSICAL THERAPY TREATMENT NOTE   Patient Name: Morgan [name] MRN: [id number] DOB:03-Jul-1985, 38 y.o., female Today's Date: 08/05/2023  END OF SESSION:  PT End of Session - 08/05/23 1532     Visit Number 17    Number of Visits 17    Date for PT Re-Evaluation 08/20/23    Authorization Type [employer]    Authorization Time Period 7 visits 07/21/23-09/18/23    Authorization - Visit Number 5    Authorization - Number of Visits 7    PT Start Time 1535    PT Stop Time 1615    PT Time Calculation (min) 40 min    Activity Tolerance Patient limited by pain;Patient tolerated treatment well    Behavior During Therapy [facility] for tasks assessed/performed              Past Medical History:  Diagnosis Date   Anemia    Back pain    Complication of anesthesia    Patient states when given the epidural the numbness went up to her nose.    Diabetes mellitus without complication (HCC)    Edema    GERD (gastroesophageal reflux disease)    Heartburn    Hepatitis    Hepatitis B carrier (HCC)    IBS (irritable bowel syndrome)    Obesity    Osteoarthritis    Palpitations    Pneumonia    Vitamin D deficiency    Past Surgical History:  Procedure Laterality Date   CARPAL TUNNEL RELEASE     gastric balloon  01/28/2021   ocular muscle corrective surgery     SLEEVE GASTROPLASTY  2022   Patient Active Problem List   Diagnosis Date Noted   Abnormal finding on imaging 07/14/2023   Multifocal pneumonia 07/06/2023   Leukopenia 06/01/2023   Other fatigue 10/07/2022   SOBOE (shortness of breath on exertion) 10/07/2022   Depression 10/07/2022   Gastroesophageal reflux disease 10/07/2022   Depression screen 10/07/2022   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   Gastroesophageal reflux disease without esophagitis 08/26/2022   Tachycardia 07/02/2022   Pain in both feet 05/07/2021   Fatigue due to excessive exertion 05/07/2021   Carpal tunnel syndrome, bilateral  10/12/2019   Chronic low back pain with right-sided sciatica 10/12/2019   Pes planus 10/12/2019   Vitamin D deficiency 10/12/2019   Healthcare maintenance 10/12/2019   Screening for hyperlipidemia 10/12/2019   Diabetes mellitus screening 10/12/2019   Heart palpitations 10/25/2018   IBS (irritable bowel syndrome) 02/11/2014   Iron deficiency anemia 01/05/2014   History of hepatitis B 12/11/2013   Generalized obesity with starting BMI 30 12/11/2013   Hyperprolactinemia (HCC) 06/01/2013    PCP: Tiffany Kocher, DO  REFERRING PROVIDER: Nestor Ramp, MD  REFERRING DIAG: Left hip pain [M25.552]   THERAPY DIAG:  Muscle weakness (generalized)  Pain in left hip  Rationale for Evaluation and Treatment: Rehabilitation  ONSET DATE: 3+ months   SUBJECTIVE:   SUBJECTIVE STATEMENT:  Patient reports that her pain as been about 3/10 since yet.   PERTINENT HISTORY: Relevant PMHx includes Chronic LBP with Rt-sided sciatica, DM, Hep B, IBS, OA, DOE, Depression, Pes planus   PAIN:  Are you having pain? Yes: NPRS scale: 9-10/10 Pain location: hip, LE  Pain description: "It feel very deep, like stabbing with a rod into my hip and radiating through my femur"  Aggravating factors: prolonged sitting, prolonged standing  Relieving factors: medicine  PRECAUTIONS: None  RED FLAGS: None  WEIGHT BEARING RESTRICTIONS: No  FALLS:  Has patient fallen in last 6 months? 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   NEXT MD VISIT: 06/08/2023 with Dr. Claudean Severance, DO  OBJECTIVE:  Note: Objective measures were completed at Evaluation unless otherwise noted.  DIAGNOSTIC FINDINGS:   05/16/23: DG Hips bilat w or w/o pelvis   IMPRESSION: 1. No acute fracture or dislocation. 2. Mild levocurvature of the lumbar spine with left  greater than right L5-S1 disc space narrowing.  PATIENT SURVEYS:  FOTO 29 current, 56 predicted  COGNITION: Overall cognitive status: Within functional limits for tasks assessed     SENSATION: WFL   POSTURE: No Significant postural limitations   LOWER EXTREMITY ROM: grossly WFL, however pain reported as below  Passive ROM Right eval Left eval  Hip flexion  P!  Hip extension    Hip abduction  P!  Hip adduction    Hip internal rotation  P!  Hip external rotation P! P!  Knee flexion    Knee extension    Ankle dorsiflexion    Ankle plantarflexion    Ankle inversion    Ankle eversion     (Blank rows = not tested)  LOWER EXTREMITY MMT:  MMT Right eval Left eval Right 07/15/23 Left 07/15/23  Hip flexion 4- 3+ 4 4-  Hip extension 4- 3- 4 3  Hip abduction 4 4- 4+ 4-  Hip adduction      Hip internal rotation 3- 2 4- 3+  Hip external rotation 3- 2 4- 3+  Knee flexion 4- 3+ 4+ 4+  Knee extension 4- 3+ 4+ 4  Ankle dorsiflexion      Ankle plantarflexion      Ankle inversion      Ankle eversion       (Blank rows = not tested)  LOWER EXTREMITY SPECIAL TESTS:  LEFT Hip special tests: Luisa Hart (FABER) test: positive , Anterior hip impingement test: positive , and Piriformis test: positive   RIGHT  Hip special tests: Luisa Hart (FABER) test: positive , Anterior hip impingement test: negative, and Piriformis test: negative   GAIT: Distance walked: 50 ft Assistive device utilized: None Level of assistance: Complete Independence Comments: no significant gait deviations noted at time of evaluation    TODAY'S TREATMENT:     [facility] Adult PT Treatment:                                                DATE: 08/05/2023  Therapeutic Exercise: Henreitta Leber with calf on pball 2x10, hold 5 sec DKTC with pball x 15 LTR with pball x 10 Supine marching "up, up, down, down" 2 x 10, 2# ankle weight  Supine hip adduction ball squeeze 5" hold x 20   Therapeutic Activity:  Standing hip 3-way  from airex, 4 x 5 each LE, 1# ankle weight   Modified dead lift to wall, 0#, 2 x 10  5# KB x 10 with mirror for visual feedback   [facility] Adult PT Treatment:  DATE: 08/03/23 Therapeutic Exercise: Nustep level 3 x 8 mins  Bridges with calf on pball 2x10, hold 5 sec, 1# ankle weight  DKTC with pball x 20, 1# ankle weight  LTR with pball x 10, 1# ankle weight Supine marching "up, up, down, down" 2 x 10, 1# ankle weight  Supine hip adduction ball squeeze 5" hold 2x10  Therapeutic Activity:  Standing hip 3-way from airex, 2 x 5 each LE, 1# ankle weight   Modified dead lift from wall, 0#, 2 x 10    [facility] Adult PT Treatment:                                                DATE: 07/29/23  Therapeutic Exercise: Nustep level 3 x 8 mins  Bridges with calf on pball x15, hold 5 sec, 1# ankle weight  DKTC with pball x 20, 1# ankle weight  LTR with pball x 10, 1# ankle weight Supine marching "up, up, down, down" 2 x 10, 1# ankle weight Therapeutic Activity:  Standing hip 3-way from airex, 3 x 5 each LE, 1# ankle weight   Modified dead lift from wall, 0#, 2 x 10   [facility] Adult PT Treatment:                                                DATE: 07/28/23  Therapeutic Exercise: Nustep level 5 x 5 mins  Bridges with calf on pball 2x10 DKTC with pball x 15 LTR with pball x 10 Supine knee fall outs with blue TB , 2 x 10 each  Supine marching "up, up, down, down"  x 10   Therapeutic Activity:  Standing hip 3-way from airex, 2 x 5 each LE  Standing heel-toe raises, 2 x 10 on airex STS on airex pad, 2 x 10  Modified dead lift from wall -> starting position against wall, 5# KB    PATIENT EDUCATION:  Education details: reviewed initial home exercise program; discussion of POC, prognosis and goals for skilled PT   Person educated: Patient Education method: Explanation, Demonstration, and Handouts Education comprehension: verbalized understanding, returned  demonstration, and needs further education  HOME EXERCISE PROGRAM: Access Code: [email or web address] URL: [email or web address] Date: 07/29/2023 Prepared by: Mauri Reading  Exercises - Clamshell with Resistance  - 1 x daily - 3 x weekly - 20 reps - Supine March with Resistance Band  - 1 x daily - 3 x weekly - 20 reps - Supine Double Leg Bridge on Box  - 1 x daily - 3 x weekly - 20 reps - Half Deadlift with Kettlebell  - 1 x daily - 3 x weekly - 2 sets - 10 reps - 3 sec hold - Standing 3-way Hip with Walker  - 1 x daily - 3 x weekly - 2 sets - 10 reps  CLINICAL IMPRESSION: Patient was able to tolerate increased resistance with strengthneing exercises. She completes her session today without increased pain. She was able to perform modified deadlifting with addid resistance today, however she benefits from mirror for feedback. We will continue to increase focus on progression towards functional activities, including lifting/carrying, pushing/pulling with good mechanics. Plan is to discharge within remaining authorized visits with updated HEP.  OBJECTIVE IMPAIRMENTS: decreased activity tolerance, decreased endurance, difficulty walking, decreased strength, and pain.   ACTIVITY LIMITATIONS: bending, squatting, sleeping, bed mobility, and caring for others  PARTICIPATION LIMITATIONS: meal prep, cleaning, laundry, interpersonal relationship, driving, community activity, and occupation  PERSONAL FACTORS: Fitness, Past/current experiences, Profession, Time since onset of injury/illness/exacerbation, and 3+ comorbidities: Relevant PMHx includes Chronic LBP with Rt-sided sciatica, DM, Hep B, IBS, OA, DOE, Depression, Pes planus  are also affecting patient's functional outcome.   REHAB POTENTIAL: Fair    CLINICAL DECISION MAKING: Evolving/moderate complexity  EVALUATION COMPLEXITY: Moderate   GOALS: Goals reviewed with patient? Yes  SHORT TERM GOALS: Target date: 06/22/2023   Patient  will be independent with initial home program for hip strengthening program.  Baseline: provided at eval  Goal status: MET Pt reports adherence 06/22/23   LONG TERM GOALS: Target date: 08/20/2023, last extended 08/05/2023 to incorporate remaining 2 visits   Patient will report improved overall functional ability with FOTO score of 50 or greater.   Baseline: 29  07/08/23: 52 Goal status: MET  2.  Patient will report ability to perform kneel to stand with no more than 3/10 pain in order to perform her daily prayer.  Baseline: moderate-to-severe pain  07/08/2023: 5/10 Goal status: Ongoing  3.  Patient will demonstrate ability to perform floor-to-counter lifting of at least 20# in order to improve tolerance of normal household chores.  Baseline: unable 07/08/23: 4-5/10 pain occasional with picking up 4 y.o. son Goal status: Ongoing  4.  Patient will demonstrate at least 4/5 MMT with BIL LE strength testing.  Baseline: see objective measures  07/15/23: see objective measures  Goal status: Progressing  5.  Patient will demonstrate ability to begin regular exercise program with frequency of at least 2-3x/week without exacerbation of symptoms.  Baseline: 0x/week  07/08/23: 3x/week walking (30-45 minutes)  Goal status: MET  6.  Patient will report ability to sleep through the night without waking from pain at least 5 days/week.  Baseline: waking up daily d/t pain  07/08/23: walking up "less frequently"  Goal status: Ongoing   PLAN:  PT FREQUENCY: 1-2x/week  PT DURATION: 4 weeks, last updated 07/15/23  PLANNED INTERVENTIONS: 97164- PT Re-evaluation, 97110-Therapeutic exercises, 97530- Therapeutic activity, 97112- Neuromuscular re-education, 97535- Self Care, 51884- Manual therapy, Patient/Family education, Taping, Dry Needling, Joint mobilization, Joint manipulation, Spinal manipulation, Spinal mobilization, Cryotherapy, and Moist heat  PLAN FOR NEXT SESSION: hip joint mobilization, STM  and TPDN as indicated, modalities as appropriate, low-impact aerobic activity for symptom modulation, LE strengthening program, updated HEP and promote independence    Mauri Reading PT  08/05/2023 5:18 PM

## 2023-08-10 ENCOUNTER — Ambulatory Visit: Payer: Medicaid Other

## 2023-08-10 DIAGNOSIS — M6281 Muscle weakness (generalized): Secondary | ICD-10-CM | POA: Diagnosis not present

## 2023-08-10 DIAGNOSIS — M25552 Pain in left hip: Secondary | ICD-10-CM | POA: Diagnosis not present

## 2023-08-10 NOTE — Therapy (Unsigned)
OUTPATIENT PHYSICAL THERAPY TREATMENT NOTE   Patient Name: Morgan [name] MRN: [id number] DOB:1985/11/16, 38 y.o., female Today's Date: 08/10/2023  END OF SESSION:  PT End of Session - 08/10/23 1134     Visit Number 18    Date for PT Re-Evaluation 08/20/23    Authorization Type [employer]    Authorization Time Period 7 visits 07/21/23-09/18/23    Authorization - Visit Number 6    Authorization - Number of Visits 7              Past Medical History:  Diagnosis Date   Anemia    Back pain    Complication of anesthesia    Patient states when given the epidural the numbness went up to her nose.    Diabetes mellitus without complication (HCC)    Edema    GERD (gastroesophageal reflux disease)    Heartburn    Hepatitis    Hepatitis B carrier (HCC)    IBS (irritable bowel syndrome)    Obesity    Osteoarthritis    Palpitations    Pneumonia    Vitamin D deficiency    Past Surgical History:  Procedure Laterality Date   CARPAL TUNNEL RELEASE     gastric balloon  01/28/2021   ocular muscle corrective surgery     SLEEVE GASTROPLASTY  2022   Patient Active Problem List   Diagnosis Date Noted   Abnormal finding on imaging 07/14/2023   Multifocal pneumonia 07/06/2023   Leukopenia 06/01/2023   Other fatigue 10/07/2022   SOBOE (shortness of breath on exertion) 10/07/2022   Depression 10/07/2022   Gastroesophageal reflux disease 10/07/2022   Depression screen 10/07/2022   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   Gastroesophageal reflux disease without esophagitis 08/26/2022   Tachycardia 07/02/2022   Pain in both feet 05/07/2021   Fatigue due to excessive exertion 05/07/2021   Carpal tunnel syndrome, bilateral 10/12/2019   Chronic low back pain with right-sided sciatica 10/12/2019   Pes planus 10/12/2019   Vitamin D deficiency 10/12/2019   Healthcare maintenance 10/12/2019   Screening for hyperlipidemia 10/12/2019   Diabetes mellitus screening  10/12/2019   Heart palpitations 10/25/2018   IBS (irritable bowel syndrome) 02/11/2014   Iron deficiency anemia 01/05/2014   History of hepatitis B 12/11/2013   Generalized obesity with starting BMI 30 12/11/2013   Hyperprolactinemia (HCC) 06/01/2013    PCP: Tiffany Kocher, DO  REFERRING PROVIDER: Nestor Ramp, MD  REFERRING DIAG: Left hip pain [M25.552]   THERAPY DIAG:  No diagnosis found.  Rationale for Evaluation and Treatment: Rehabilitation  ONSET DATE: 3+ months   SUBJECTIVE:   SUBJECTIVE STATEMENT:  Patient reports that her pain as been about 3/10 since yet.   PERTINENT HISTORY: Relevant PMHx includes Chronic LBP with Rt-sided sciatica, DM, Hep B, IBS, OA, DOE, Depression, Pes planus   PAIN:  Are you having pain? Yes: NPRS scale: 9-10/10 Pain location: hip, LE  Pain description: "It feel very deep, like stabbing with a rod into my hip and radiating through my femur"  Aggravating factors: prolonged sitting, prolonged standing  Relieving factors: medicine  PRECAUTIONS: None  RED FLAGS: None   WEIGHT BEARING RESTRICTIONS: No  FALLS:  Has patient fallen in last 6 months? 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   NEXT MD VISIT: 06/08/2023 with Dr. Claudean Severance, DO  OBJECTIVE:  Note: Objective measures were completed at Evaluation unless otherwise noted.  DIAGNOSTIC FINDINGS:   05/16/23: DG Hips bilat w or w/o pelvis   IMPRESSION: 1. No acute fracture or dislocation. 2. Mild levocurvature of the lumbar spine with left greater than right L5-S1 disc space narrowing.  PATIENT SURVEYS:  FOTO 29 current, 56 predicted  COGNITION: Overall cognitive status: Within functional limits for tasks assessed     SENSATION: WFL   POSTURE: No Significant postural limitations   LOWER EXTREMITY  ROM: grossly WFL, however pain reported as below  Passive ROM Right eval Left eval  Hip flexion  P!  Hip extension    Hip abduction  P!  Hip adduction    Hip internal rotation  P!  Hip external rotation P! P!  Knee flexion    Knee extension    Ankle dorsiflexion    Ankle plantarflexion    Ankle inversion    Ankle eversion     (Blank rows = not tested)  LOWER EXTREMITY MMT:  MMT Right eval Left eval Right 07/15/23 Left 07/15/23 Right 08/10/23 Left 08/10/23  Hip flexion 4- 3+ 4 4- 4+ 4  Hip extension 4- 3- 4 3    Hip abduction 4 4- 4+ 4- 5 5  Hip adduction        Hip internal rotation 3- 2 4- 3+ 4+ 4+  Hip external rotation 3- 2 4- 3+ 4+ 4+  Knee flexion 4- 3+ 4+ 4+ 4+ 4+  Knee extension 4- 3+ 4+ 4 4+ 4+  Ankle dorsiflexion        Ankle plantarflexion        Ankle inversion        Ankle eversion         (Blank rows = not tested)  LOWER EXTREMITY SPECIAL TESTS:  LEFT Hip special tests: Luisa Hart (FABER) test: positive , Anterior hip impingement test: positive , and Piriformis test: positive   RIGHT  Hip special tests: Luisa Hart (FABER) test: positive , Anterior hip impingement test: negative, and Piriformis test: negative   GAIT: Distance walked: 50 ft Assistive device utilized: None Level of assistance: Complete Independence Comments: no significant gait deviations noted at time of evaluation    TODAY'S TREATMENT:     [facility] Adult PT Treatment:                                                DATE: 08/05/2023  Therapeutic Exercise: Henreitta Leber with calf on pball 2x10, hold 5 sec DKTC with pball x 15 LTR with pball x 10 Supine marching "up, up, down, down" 2 x 10, 2# ankle weight  Supine hip adduction ball squeeze 5" hold x 20   Therapeutic Activity:  Standing hip 3-way from airex, 4 x 5 each LE, 1# ankle weight   Modified dead lift to wall, 0#, 2 x 10  5# KB x 10 with mirror for visual feedback   [facility] Adult PT Treatment:                                                 DATE: 08/03/23 Therapeutic Exercise: Nustep level 3 x 8 mins  Bridges with calf on pball 2x10, hold 5 sec, 1# ankle  weight  DKTC with pball x 20, 1# ankle weight  LTR with pball x 10, 1# ankle weight Supine marching "up, up, down, down" 2 x 10, 1# ankle weight  Supine hip adduction ball squeeze 5" hold 2x10  Therapeutic Activity:  Standing hip 3-way from airex, 2 x 5 each LE, 1# ankle weight   Modified dead lift from wall, 0#, 2 x 10    [facility] Adult PT Treatment:                                                DATE: 07/29/23  Therapeutic Exercise: Nustep level 3 x 8 mins  Bridges with calf on pball x15, hold 5 sec, 1# ankle weight  DKTC with pball x 20, 1# ankle weight  LTR with pball x 10, 1# ankle weight Supine marching "up, up, down, down" 2 x 10, 1# ankle weight Therapeutic Activity:  Standing hip 3-way from airex, 3 x 5 each LE, 1# ankle weight   Modified dead lift from wall, 0#, 2 x 10   [facility] Adult PT Treatment:                                                DATE: 07/28/23  Therapeutic Exercise: Nustep level 5 x 5 mins  Bridges with calf on pball 2x10 DKTC with pball x 15 LTR with pball x 10 Supine knee fall outs with blue TB , 2 x 10 each  Supine marching "up, up, down, down"  x 10   Therapeutic Activity:  Standing hip 3-way from airex, 2 x 5 each LE  Standing heel-toe raises, 2 x 10 on airex STS on airex pad, 2 x 10  Modified dead lift from wall -> starting position against wall, 5# KB    PATIENT EDUCATION:  Education details: reviewed initial home exercise program; discussion of POC, prognosis and goals for skilled PT   Person educated: Patient Education method: Explanation, Demonstration, and Handouts Education comprehension: verbalized understanding, returned demonstration, and needs further education  HOME EXERCISE PROGRAM: Access Code: [email or web address] URL: [email or web address] Date: 07/29/2023 Prepared by: Mauri Reading  Exercises - Clamshell  with Resistance  - 1 x daily - 3 x weekly - 20 reps - Supine March with Resistance Band  - 1 x daily - 3 x weekly - 20 reps - Supine Double Leg Bridge on Box  - 1 x daily - 3 x weekly - 20 reps - Half Deadlift with Kettlebell  - 1 x daily - 3 x weekly - 2 sets - 10 reps - 3 sec hold - Standing 3-way Hip with Walker  - 1 x daily - 3 x weekly - 2 sets - 10 reps  CLINICAL IMPRESSION: Patient was able to tolerate increased resistance with strengthneing exercises. She completes her session today without increased pain. She was able to perform modified deadlifting with addid resistance today, however she benefits from mirror for feedback. We will continue to increase focus on progression towards functional activities, including lifting/carrying, pushing/pulling with good mechanics. Plan is to discharge within remaining authorized visits with updated HEP.    OBJECTIVE IMPAIRMENTS: decreased activity tolerance, decreased endurance, difficulty walking, decreased strength, and pain.   ACTIVITY LIMITATIONS: bending, squatting, sleeping, bed  mobility, and caring for others  PARTICIPATION LIMITATIONS: meal prep, cleaning, laundry, interpersonal relationship, driving, community activity, and occupation  PERSONAL FACTORS: Fitness, Past/current experiences, Profession, Time since onset of injury/illness/exacerbation, and 3+ comorbidities: Relevant PMHx includes Chronic LBP with Rt-sided sciatica, DM, Hep B, IBS, OA, DOE, Depression, Pes planus  are also affecting patient's functional outcome.   REHAB POTENTIAL: Fair    CLINICAL DECISION MAKING: Evolving/moderate complexity  EVALUATION COMPLEXITY: Moderate   GOALS: Goals reviewed with patient? Yes  SHORT TERM GOALS: Target date: 06/22/2023   Patient will be independent with initial home program for hip strengthening program.  Baseline: provided at eval  Goal status: MET Pt reports adherence 06/22/23   LONG TERM GOALS: Target date: 08/20/2023, last  extended 08/05/2023 to incorporate remaining 2 visits   Patient will report improved overall functional ability with FOTO score of 50 or greater.   Baseline: 29  07/08/23: 52 Goal status: MET  2.  Patient will report ability to perform kneel to stand with no more than 3/10 pain in order to perform her daily prayer.  Baseline: moderate-to-severe pain  07/08/2023: 5/10 08/10/2023: 0/10 Goal status: Ongoing  3.  Patient will demonstrate ability to perform floor-to-counter lifting of at least 20# in order to improve tolerance of normal household chores.  Baseline: unable 07/08/23: 4-5/10 pain occasional with picking up 4 y.o. son 08/10/23: 0/10 Goal status: Ongoing  4.  Patient will demonstrate at least 4/5 MMT with BIL LE strength testing.  Baseline: see objective measures  07/15/23: see objective measures  Goal status: Progressing  5.  Patient will demonstrate ability to begin regular exercise program with frequency of at least 2-3x/week without exacerbation of symptoms.  Baseline: 0x/week  07/08/23: 3x/week walking (30-45 minutes)  08/10/23: 3x/week HEP Goal status: MET  6.  Patient will report ability to sleep through the night without waking from pain at least 5 days/week.  Baseline: waking up daily d/t pain  07/08/23: walking up "less frequently"  08/10/23: "less frequently", about 3-4x/week with less severity and less widespread Goal status: Ongoing   PLAN:  PT FREQUENCY: 1-2x/week  PT DURATION: 4 weeks, last updated 07/15/23  PLANNED INTERVENTIONS: 97164- PT Re-evaluation, 97110-Therapeutic exercises, 97530- Therapeutic activity, 97112- Neuromuscular re-education, 97535- Self Care, [id number]- Manual therapy, Patient/Family education, Taping, Dry Needling, Joint mobilization, Joint manipulation, Spinal manipulation, Spinal mobilization, Cryotherapy, and Moist heat  PLAN FOR NEXT SESSION: hip joint mobilization, STM and TPDN as indicated, modalities as appropriate, low-impact aerobic  activity for symptom modulation, LE strengthening program, updated HEP and promote independence    Mauri Reading, PT, DPT  08/10/2023 11:35 AM

## 2023-08-12 ENCOUNTER — Ambulatory Visit: Payer: Medicaid Other | Admitting: Family Medicine

## 2023-08-12 ENCOUNTER — Ambulatory Visit: Payer: Medicaid Other

## 2023-08-13 ENCOUNTER — Encounter: Payer: Self-pay | Admitting: Student

## 2023-08-13 ENCOUNTER — Ambulatory Visit: Payer: Medicaid Other | Admitting: Student

## 2023-08-13 VITALS — BP 104/79 | HR 67 | Ht 60.0 in | Wt 140.4 lb

## 2023-08-13 DIAGNOSIS — R9389 Abnormal findings on diagnostic imaging of other specified body structures: Secondary | ICD-10-CM

## 2023-08-13 DIAGNOSIS — M25552 Pain in left hip: Secondary | ICD-10-CM | POA: Diagnosis not present

## 2023-08-13 NOTE — Assessment & Plan Note (Signed)
Resolved, follow-up imaging without concern.

## 2023-08-13 NOTE — Patient Instructions (Addendum)
It was great to see you! Thank you for allowing me to participate in your care!   I recommend that you always bring your medications to each appointment as this makes it easy to ensure we are on the correct medications and helps [place] not miss when refills are needed.  Our plans for today:  - Call [phone], sports medicine center  Take care and seek immediate care sooner if you develop any concerns. Please remember to show up 15 minutes before your scheduled appointment time!  Tiffany Kocher, DO [facility]

## 2023-08-13 NOTE — Progress Notes (Signed)
    SUBJECTIVE:   CHIEF COMPLAINT / HPI:   Left hip pain Patient reports significant movement secondary to PT.  Finishes PT next week.  Still having point tenderness over the greater trochanter, rolling onto side at night elicits pain.  Discussed potential injection today, and patient politely declines-she would prefer to see sports medicine.  Abnormal finding on CT Underwent CT abdomen for a prior abnormal finding on CT of lymphadenopathy and splenomegaly.  Reviewed CT abdomen with contrast, no evidence of lymphadenopathy and imaging was unremarkable.  OBJECTIVE:   BP 104/79   Pulse 67   Ht 5' (1.524 m)   Wt 140 lb 6 oz (63.7 kg)   LMP 08/12/2023   SpO2 100%   BMI 27.42 kg/m    General: NAD, pleasant Cardio: RRR, no MRG. Cap Refill <2s. Respiratory: CTAB, normal wob on RA Skin: Warm and dry Left hip: No gross deformity.  Point tenderness over greater trochanter.  4/5 hip abductor strength.  FADIR positive for eliciting pain in lateral hip.  FABER negative.  Straight leg negative.  ASSESSMENT/PLAN:   Assessment & Plan Left hip pain Improved, still suspect greater trochanter bursitis given point tenderness over greater trochanter.  Patient declines injection at our clinic, prefers to see sports medicine. - Finish PT - Follow-up with sports medicine Abnormal finding on imaging Resolved, follow-up imaging without concern.   Tiffany Kocher, DO [facility]

## 2023-08-18 NOTE — Progress Notes (Unsigned)
 [facility] History and Physical   Primary Care Physician:  Tiffany Kocher, DO   Reason for Procedure:  Iron deficiency anemia, GERD  Plan:    Diagnostic upper endoscopy and colonoscopy   HPI: Morgan [name] is a 38 y.o. female undergoing diagnostic upper endoscopy for symptoms of worsening GERD.She reports a longstanding history of GERD dating back to adolescence/early 68s. 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 Typically her GERD is well-managed with a proton pump inhibitor and/or H2 blocker. In fall 2024 she incurred a respiratory infection with what she describes as flu and pneumonia. Since that time she has experienced a tearing and burning sensation in her esophagus, regurgitation, hoarseness and cough.   Also noted to have iron deficiency anemia -most recent hemoglobin 11.3.  Denies melena, hematochezia or hematemesis.  No previous colonoscopy.  No family history of colorectal cancer or polyps.   Past Medical History:  Diagnosis Date   Anemia    Back pain    Complication of anesthesia    Patient states when given the epidural the numbness went up to her nose.    Edema    GERD (gastroesophageal reflux disease)    Heartburn    Hepatitis    Hepatitis B carrier (HCC)    IBS (irritable bowel syndrome)    Obesity    Osteoarthritis    Palpitations    Pneumonia    Vitamin D deficiency     Past Surgical History:  Procedure Laterality Date   CARPAL TUNNEL RELEASE     gastric balloon  01/28/2021   ocular muscle corrective surgery     SLEEVE GASTROPLASTY  2022    Prior to Admission medications   Medication Sig Start Date End Date Taking? Authorizing Provider  acetaminophen (TYLENOL) 500 MG tablet Take 500-1,000 mg by mouth every 6 (six) hours as needed for moderate pain (pain score 4-6).    [provider]  azithromycin (ZITHROMAX) 250 MG tablet Take 1 tablet (250 mg total) by mouth daily. 06/30/23    Renne Crigler, PA-C  baclofen (LIORESAL) 10 MG tablet Take 1 tablet (10 mg total) by mouth at bedtime. 07/16/23   Ottie Glazier, MD  benzonatate (TESSALON) 100 MG capsule Take 1 capsule (100 mg total) by mouth 2 (two) times daily as needed for cough. 07/09/23   Dameron, Nolberto Hanlon, DO  buPROPion (WELLBUTRIN XL) 300 MG 24 hr tablet Take 1 tablet (300 mg total) by mouth daily. 07/12/23   Bowen, Scot Jun, DO  cetirizine (ZYRTEC) 10 MG tablet Take 1 tablet (10 mg total) by mouth daily. 07/14/23   Dameron, Nolberto Hanlon, DO  doxycycline (VIBRA-TABS) 100 MG tablet Take 1 tablet (100 mg total) by mouth 2 (two) times daily. 07/06/23   Dameron, Nolberto Hanlon, DO  ferrous gluconate (FERGON) 324 MG tablet Take 1 tablet (324 mg total) by mouth daily with breakfast. 07/12/23   Bowen, Scot Jun, DO  fluticasone (FLONASE) 50 MCG/ACT nasal spray Place 2 sprays into both nostrils daily. 07/14/23   Dameron, Nolberto Hanlon, DO  Multiple Vitamin (MULTIVITAMIN) tablet Take 1 tablet by mouth daily.    [provider]  Na Sulfate-K Sulfate-Mg Sulfate concentrate 17.5-3.13-1.6 GM/177ML SOLN Use as directed; may use generic; goodrx card if insurance will not cover generic 07/16/23   Lennell Shanks, Durene Romans, MD  naproxen sodium (ALEVE) 220 MG tablet Take 440 mg by mouth 2 (two) times daily as needed (Pain).    [provider]  pantoprazole (PROTONIX) 40 MG tablet  Take 1 tablet (40 mg total) by mouth 2 (two) times daily. Patient taking differently: Take 80 mg by mouth 2 (two) times daily. 07/12/23   Bowen, Scot Jun, DO    Current Outpatient Medications  Medication Sig Dispense Refill   ferrous gluconate (FERGON) 324 MG tablet Take 1 tablet (324 mg total) by mouth daily with breakfast. 30 tablet 1   fluticasone (FLONASE) 50 MCG/ACT nasal spray Place 2 sprays into both nostrils daily. 16 g 6   pantoprazole (PROTONIX) 40 MG tablet Take 1 tablet (40 mg total) by mouth 2 (two) times daily. (Patient taking differently: Take 80 mg by mouth 2 (two) times  daily.) 180 tablet 0   acetaminophen (TYLENOL) 500 MG tablet Take 500-1,000 mg by mouth every 6 (six) hours as needed for moderate pain (pain score 4-6).     baclofen (LIORESAL) 10 MG tablet Take 1 tablet (10 mg total) by mouth at bedtime. 30 each 0   benzonatate (TESSALON) 100 MG capsule Take 1 capsule (100 mg total) by mouth 2 (two) times daily as needed for cough. (Patient not taking: Reported on 08/19/2023) 20 capsule 0   buPROPion (WELLBUTRIN XL) 300 MG 24 hr tablet Take 1 tablet (300 mg total) by mouth daily. 30 tablet 0   cetirizine (ZYRTEC) 10 MG tablet Take 1 tablet (10 mg total) by mouth daily. 30 tablet 11   Multiple Vitamin (MULTIVITAMIN) tablet Take 1 tablet by mouth daily.     naproxen sodium (ALEVE) 220 MG tablet Take 440 mg by mouth 2 (two) times daily as needed (Pain).     Current Facility-Administered Medications  Medication Dose Route Frequency Provider Last Rate Last Admin   0.9 %  sodium chloride infusion  500 mL Intravenous Once Ottie Glazier, MD        Allergies as of 08/19/2023 - Review Complete 08/19/2023  Allergen Reaction Noted   Metronidazole Other (See Comments)     Family History  Problem Relation Age of Onset   Hypertension Mother    Irritable bowel syndrome Mother    Hyperthyroidism Mother    Asthma Mother    Hypertension Father    Hyperlipidemia Father    Irritable bowel syndrome Father    Kidney disease Sister    Hyperthyroidism Sister    Asthma Sister    Asthma Maternal Grandmother    Asthma Son    Alcohol abuse Neg Hx    Arthritis Neg Hx    Birth defects Neg Hx    Cancer Neg Hx    COPD Neg Hx    Depression Neg Hx    Diabetes Neg Hx    Drug abuse Neg Hx    Early death Neg Hx    Hearing loss Neg Hx    Heart disease Neg Hx    Learning disabilities Neg Hx    Mental illness Neg Hx    Mental retardation Neg Hx    Miscarriages / Stillbirths Neg Hx    Stroke Neg Hx    Vision loss Neg Hx    Rectal cancer Neg Hx     Social History    Socioeconomic History   Marital status: Married    Spouse name: Olena Heckle   Number of children: 3   Years of education: Not on file   Highest education level: Professional school degree (e.g., MD, DDS, DVM, JD)  Occupational History   Occupation: housewife  Tobacco Use   Smoking status: Never    Passive exposure: Never  Smokeless tobacco: Never  Vaping Use   Vaping status: Never Used  Substance and Sexual Activity   Alcohol use: Never   Drug use: Never   Sexual activity: Never    Partners: Male    Birth control/protection: None  Other Topics Concern   Not on file  Social History Narrative   ** Merged History Encounter **       Social Drivers of Health   Financial Resource Strain: Low Risk  (06/08/2023)   Overall Financial Resource Strain (CARDIA)    Difficulty of Paying Living Expenses: Not hard at all  Food Insecurity: No Food Insecurity (06/08/2023)   Hunger Vital Sign    Worried About Running Out of Food in the Last Year: Never true    Ran Out of Food in the Last Year: Never true  Transportation Needs: No Transportation Needs (06/08/2023)   PRAPARE - Administrator, Civil Service (Medical): No    Lack of Transportation (Non-Medical): No  Physical Activity: Insufficiently Active (06/08/2023)   Exercise Vital Sign    Days of Exercise per Week: 3 days    Minutes of Exercise per Session: 30 min  Stress: No Stress Concern Present (06/08/2023)   [employer] of Occupational Health - Occupational Stress Questionnaire    Feeling of Stress : Only a little  Social Connections: Moderately Integrated (06/08/2023)   Social Connection and Isolation Panel [NHANES]    Frequency of Communication with Friends and Family: More than three times a week    Frequency of Social Gatherings with Friends and Family: Twice a week    Attends Religious Services: More than 4 times per year    Active Member of [employer] or Organizations: No    Attends Banker  Meetings: Not on file    Marital Status: Married  Catering manager Violence: Not At Risk (06/01/2023)   Humiliation, Afraid, Rape, and Kick questionnaire    Fear of Current or Ex-Partner: No    Emotionally Abused: No    Physically Abused: No    Sexually Abused: No    Review of Systems:  All other review of systems negative except as mentioned in the HPI.  Physical Exam: Vital signs BP 101/61   Pulse 62   Temp 97.9 F (36.6 C)   Ht 5' (1.524 m)   Wt 141 lb (64 kg)   LMP 08/12/2023   SpO2 98%   BMI 27.54 kg/m   General:   Alert,  Well-developed, well-nourished, pleasant and cooperative in NAD Airway:  Mallampati 1 Lungs:  Clear throughout to auscultation.   Heart:  Regular rate and rhythm; no murmurs, clicks, rubs,  or gallops. Abdomen:  Soft, nontender and nondistended. Normal bowel sounds.   Neuro/Psych:  Normal mood and affect. A and O x 3  Maren Beach, MD [facility]

## 2023-08-19 ENCOUNTER — Encounter: Payer: Self-pay | Admitting: Pediatrics

## 2023-08-19 ENCOUNTER — Ambulatory Visit: Payer: Medicaid Other | Admitting: Pediatrics

## 2023-08-19 VITALS — BP 116/82 | HR 52 | Temp 97.9°F | Resp 12 | Ht 60.0 in | Wt 141.0 lb

## 2023-08-19 DIAGNOSIS — Z9889 Other specified postprocedural states: Secondary | ICD-10-CM

## 2023-08-19 DIAGNOSIS — K3189 Other diseases of stomach and duodenum: Secondary | ICD-10-CM | POA: Diagnosis not present

## 2023-08-19 DIAGNOSIS — K2950 Unspecified chronic gastritis without bleeding: Secondary | ICD-10-CM | POA: Diagnosis not present

## 2023-08-19 DIAGNOSIS — K219 Gastro-esophageal reflux disease without esophagitis: Secondary | ICD-10-CM | POA: Diagnosis not present

## 2023-08-19 DIAGNOSIS — K2980 Duodenitis without bleeding: Secondary | ICD-10-CM | POA: Diagnosis not present

## 2023-08-19 DIAGNOSIS — K449 Diaphragmatic hernia without obstruction or gangrene: Secondary | ICD-10-CM

## 2023-08-19 DIAGNOSIS — E669 Obesity, unspecified: Secondary | ICD-10-CM | POA: Diagnosis not present

## 2023-08-19 DIAGNOSIS — D509 Iron deficiency anemia, unspecified: Secondary | ICD-10-CM

## 2023-08-19 DIAGNOSIS — B9681 Helicobacter pylori [H. pylori] as the cause of diseases classified elsewhere: Secondary | ICD-10-CM | POA: Diagnosis not present

## 2023-08-19 DIAGNOSIS — K648 Other hemorrhoids: Secondary | ICD-10-CM

## 2023-08-19 MED ORDER — SODIUM CHLORIDE 0.9 % IV SOLN: 500.0000 mL | Freq: Once | INTRAVENOUS | Status: DC

## 2023-08-19 MED ORDER — BACLOFEN 10 MG PO TABS: 10.0000 mg | ORAL_TABLET | Freq: Every evening | ORAL | 3 refills | Status: DC

## 2023-08-19 NOTE — Progress Notes (Signed)
 Called to room to assist during endoscopic procedure.  Patient ID and intended procedure confirmed with present staff. Received instructions for my participation in the procedure from the performing physician.

## 2023-08-19 NOTE — Op Note (Signed)
 [facility] Patient Name: Morgan [name] Procedure Date: 08/19/2023 3:02 PM MRN: [id number] Endoscopist: Maren Beach , MD, [id number] Age: 38 Referring MD:  Date of Birth: 01/17/1986 Gender: Female Account #: [id number] Procedure:                Upper GI endoscopy Indications:              Iron deficiency anemia, Heartburn, Follow-up of                            gastro-esophageal reflux disease, Regurgitation Medicines:                Monitored Anesthesia Care Procedure:                Pre-Anesthesia Assessment:                           - Prior to the procedure, a History and Physical                            was performed, and patient medications and                            allergies were reviewed. The patient's tolerance of                            previous anesthesia was also reviewed. The risks                            and benefits of the procedure and the sedation                            options and risks were discussed with the patient.                            All questions were answered, and informed consent                            was obtained. Prior Anticoagulants: The patient has                            taken no anticoagulant or antiplatelet agents. ASA                            Grade Assessment: II - A patient with mild systemic                            disease. After reviewing the risks and benefits,                            the patient was deemed in satisfactory condition to                            undergo the procedure.  After obtaining informed consent, the endoscope was                            passed under direct vision. Throughout the                            procedure, the patient's blood pressure, pulse, and                            oxygen saturations were monitored continuously. The                            GIF [id number] #[id number] was introduced through the                            mouth,  and advanced to the second part of duodenum.                            The upper GI endoscopy was accomplished without                            difficulty. The patient tolerated the procedure                            well. Scope In: Scope Out: Findings:                 The examined esophagus was normal.                           Evidence of a sleeve gastrectomy was found in the                            stomach. This was characterized by healthy                            appearing mucosa.                           Normal mucosa was found in the gastric body, in the                            gastric antrum, in the cardia (on retroflexion) and                            in the gastric fundus (on retroflexion). Biopsies                            were taken with a cold forceps for Helicobacter                            pylori testing.                           A small hiatal hernia was present.  The duodenal bulb and second portion of the                            duodenum were normal. Biopsies for histology were                            taken with a cold forceps for evaluation of celiac                            disease. Complications:            No immediate complications. Estimated blood loss:                            Minimal. Estimated Blood Loss:     Estimated blood loss was minimal. Impression:               - Normal esophagus.                           - A sleeve gastrectomy was found, characterized by                            healthy appearing mucosa.                           - Normal mucosa was found in the gastric body, in                            the antrum, in the cardia and in the gastric                            fundus. Biopsied.                           - Small hiatal hernia.                           - Normal duodenal bulb and second portion of the                            duodenum. Biopsied. Recommendation:           -  Discharge patient to home (ambulatory).                           - Await pathology results.                           - Perform a colonoscopy today.                           - The findings and recommendations were discussed                            with the patient's family. Maren Beach, MD 08/19/2023 4:00:32 PM This report has been signed electronically.

## 2023-08-19 NOTE — Progress Notes (Signed)
 Sedate, gd SR, tolerated procedure well, VSS, report to RN

## 2023-08-19 NOTE — Progress Notes (Signed)
 Pt's states no medical or surgical changes since previsit or office visit.

## 2023-08-19 NOTE — Op Note (Signed)
 [facility] Patient Name: Morgan [name] Procedure Date: 08/19/2023 2:55 PM MRN: [id number] Endoscopist: Maren Beach , MD, [id number] Age: 38 Referring MD:  Date of Birth: 03/24/1986 Gender: Female Account #: [id number] Procedure:                Colonoscopy Indications:              Iron deficiency anemia Medicines:                Monitored Anesthesia Care Procedure:                Pre-Anesthesia Assessment:                           - Prior to the procedure, a History and Physical                            was performed, and patient medications and                            allergies were reviewed. The patient's tolerance of                            previous anesthesia was also reviewed. The risks                            and benefits of the procedure and the sedation                            options and risks were discussed with the patient.                            All questions were answered, and informed consent                            was obtained. Prior Anticoagulants: The patient has                            taken no anticoagulant or antiplatelet agents. ASA                            Grade Assessment: II - A patient with mild systemic                            disease. After reviewing the risks and benefits,                            the patient was deemed in satisfactory condition to                            undergo the procedure.                           After obtaining informed consent, the colonoscope  was passed under direct vision. Throughout the                            procedure, the patient's blood pressure, pulse, and                            oxygen saturations were monitored continuously. The                            CF [id number] #[id number] was introduced through the anus                            and advanced to the terminal ileum. The colonoscopy                            was performed without  difficulty. The patient                            tolerated the procedure well. The quality of the                            bowel preparation was good. The terminal ileum,                            ileocecal valve, appendiceal orifice, and rectum                            were photographed. Scope In: 3:40:27 PM Scope Out: 3:55:19 PM Scope Withdrawal Time: 0 hours 10 minutes 11 seconds  Total Procedure Duration: 0 hours 14 minutes 52 seconds  Findings:                 The perianal and digital rectal examinations were                            normal. Pertinent negatives include normal                            sphincter tone and no palpable rectal lesions.                           The colon (entire examined portion) appeared normal.                           The terminal ileum appeared normal.                           Internal hemorrhoids were found during retroflexion. Complications:            No immediate complications. Estimated blood loss:                            None. Estimated Blood Loss:     Estimated blood loss: none. Impression:               - The entire examined colon  is normal.                           - The examined portion of the ileum was normal.                           - Internal hemorrhoids.                           - No specimens collected. Recommendation:           - Discharge patient to home (ambulatory).                           - Repeat colonoscopy for screening purposes at age                            21 years.                           - The findings and recommendations were discussed                            with the patient's family.                           - Patient has a contact number available for                            emergencies. The signs and symptoms of potential                            delayed complications were discussed with the                            patient. Return to normal activities tomorrow.                             Written discharge instructions were provided to the                            patient. Maren Beach, MD 08/19/2023 4:04:11 PM This report has been signed electronically.

## 2023-08-19 NOTE — Patient Instructions (Signed)
 Resume previous diet and medications. Awaiting pathology results.  YOU HAD AN ENDOSCOPIC PROCEDURE TODAY AT THE [facility]:   Refer to the procedure report that was given to you for any specific questions about what was found during the examination.  If the procedure report does not answer your questions, please call your gastroenterologist to clarify.  If you requested that your care partner not be given the details of your procedure findings, then the procedure report has been included in a sealed envelope for you to review at your convenience later.  YOU SHOULD EXPECT: Some feelings of bloating in the abdomen. Passage of more gas than usual.  Walking can help get rid of the air that was put into your GI tract during the procedure and reduce the bloating. If you had a lower endoscopy (such as a colonoscopy or flexible sigmoidoscopy) you may notice spotting of blood in your stool or on the toilet paper. If you underwent a bowel prep for your procedure, you may not have a normal bowel movement for a few days.  Please Note:  You might notice some irritation and congestion in your nose or some drainage.  This is from the oxygen used during your procedure.  There is no need for concern and it should clear up in a day or so.  SYMPTOMS TO REPORT IMMEDIATELY:  Following lower endoscopy (colonoscopy or flexible sigmoidoscopy):  Excessive amounts of blood in the stool  Significant tenderness or worsening of abdominal pains  Swelling of the abdomen that is new, acute  Fever of 100F or higher  Following upper endoscopy (EGD)  Vomiting of blood or coffee ground material  New chest pain or pain under the shoulder blades  Painful or persistently difficult swallowing  New shortness of breath  Fever of 100F or higher  Black, tarry-looking stools  For urgent or emergent issues, a gastroenterologist can be reached at any hour by calling [phone]. Do not use MyChart messaging for urgent  concerns.    DIET:  We do recommend a small meal at first, but then you may proceed to your regular diet.  Drink plenty of fluids but you should avoid alcoholic beverages for 24 hours.  ACTIVITY:  You should plan to take it easy for the rest of today and you should NOT DRIVE or use heavy machinery until tomorrow (because of the sedation medicines used during the test).    FOLLOW UP: Our staff will call the number listed on your records the next business day following your procedure.  We will call around 7:15- 8:00 am to check on you and address any questions or concerns that you may have regarding the information given to you following your procedure. If we do not reach you, we will leave a message.     If any biopsies were taken you will be contacted by phone or by letter within the next 1-3 weeks.  Please call us at [phone] if you have not heard about the biopsies in 3 weeks.    SIGNATURES/CONFIDENTIALITY: You and/or your care partner have signed paperwork which will be entered into your electronic medical record.  These signatures attest to the fact that that the information above on your After Visit Summary has been reviewed and is understood.  Full responsibility of the confidentiality of this discharge information lies with you and/or your care-partner.

## 2023-08-20 ENCOUNTER — Telehealth: Payer: Self-pay

## 2023-08-20 ENCOUNTER — Other Ambulatory Visit: Payer: Self-pay | Admitting: Family Medicine

## 2023-08-20 DIAGNOSIS — F3289 Other specified depressive episodes: Secondary | ICD-10-CM

## 2023-08-20 NOTE — Telephone Encounter (Signed)
 Follow up call to pt, lm for pt to call if having any difficulty with normal activities or eating and drinking.  Also to call if any other questions or concerns.

## 2023-08-23 DIAGNOSIS — F3289 Other specified depressive episodes: Secondary | ICD-10-CM

## 2023-08-24 ENCOUNTER — Encounter: Payer: Self-pay | Admitting: Pediatrics

## 2023-08-24 LAB — SURGICAL PATHOLOGY

## 2023-08-24 MED ORDER — BUPROPION HCL ER (XL) 300 MG PO TB24: 300.0000 mg | ORAL_TABLET | Freq: Every day | ORAL | 2 refills | Status: DC

## 2023-08-25 MED ORDER — TALICIA 250-12.5-10 MG PO CPDR: 4.0000 | DELAYED_RELEASE_CAPSULE | Freq: Three times a day (TID) | ORAL | 0 refills | Status: DC

## 2023-08-25 NOTE — Telephone Encounter (Signed)
 Rx for Talicia sent to pharmacy. Reminder placed in EPIC for patient to have H pylori stool testing 4-8 weeks after completion of treatment.

## 2023-08-25 NOTE — Telephone Encounter (Signed)
===  View-only below this line=== ----- Message ----- From: Ottie Glazier, MD Sent: 08/24/2023   6:56 PM EST To: Richardson Chiquito, RN Subject: Morgan [name] Script                                 Hi Dottie -  Ms. Hago Eliman's gastric biopsies were positive for H. pylori.  She is allergic to metronidazole and can therefore not take bismuth quadruple therapy with Pylera.  I have recommended Talicia 4 capsules p.o. 3 times daily x 14 days.  Can you please send in a prescription for her and also coordinate test of cure with an H. pylori stool antigen 48 weeks after completing treatment?  Thanks,  Harriett Sine

## 2023-08-30 ENCOUNTER — Ambulatory Visit: Admitting: Family Medicine

## 2023-08-30 VITALS — BP 108/84 | Ht 60.63 in | Wt 138.0 lb

## 2023-08-30 DIAGNOSIS — G8929 Other chronic pain: Secondary | ICD-10-CM | POA: Diagnosis not present

## 2023-08-30 DIAGNOSIS — M25552 Pain in left hip: Secondary | ICD-10-CM

## 2023-08-30 DIAGNOSIS — M25571 Pain in right ankle and joints of right foot: Secondary | ICD-10-CM

## 2023-08-30 NOTE — Patient Instructions (Signed)
 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 Avoid painful activities as much as possible. Ice over area of pain 3-4 times a day for 15 minutes at a time Hip side raise exercise 3 sets of 10 once or twice a day - this is very important (add 2 or 5 pound ankle weight as you have). Stretches - pick 2-3 and hold for 20-30 seconds x 3 - do once or twice a day. Topical voltaren gel up to 4 times a day. Consider steroid injection if you're struggling. Follow up with me in 6 weeks.  Send me the report of your ankle MRI through mychart when you get a chance. I'm curious if you have an osteochondral defect based on what you're describing. But we will discuss either through mychart or on the phone after I review this.

## 2023-08-31 ENCOUNTER — Encounter: Payer: Self-pay | Admitting: Family Medicine

## 2023-08-31 NOTE — Progress Notes (Signed)
 PCP: Tiffany Kocher, DO  Subjective:   HPI: Patient is a 38 y.o. female here for left hip pain, right ankle pain.  Patient reports she's had about 3 months of left lateral hip pain. No acute injury or trauma. Wakes her up when lying on her left side. Worse when she sits for a prolonged period then goes to get up. No groin pain. No numbness/tingling. No back pain. Has done physical therapy and home exercises which she reports have helped quite a bit - pain about 50% improved. Also reports several years of lateral right ankle pain - about 3 years ago she had an MRI - does not have the report with her but can have this sent. Continues to have pain in this area worse with walking and when on her feet a lot.  Past Medical History:  Diagnosis Date   Anemia    Back pain    Complication of anesthesia    Patient states when given the epidural the numbness went up to her nose.    Edema    GERD (gastroesophageal reflux disease)    Heartburn    Hepatitis    Hepatitis B carrier (HCC)    IBS (irritable bowel syndrome)    Obesity    Osteoarthritis    Palpitations    Pneumonia    Vitamin D deficiency     Current Outpatient Medications on File Prior to Visit  Medication Sig Dispense Refill   acetaminophen (TYLENOL) 500 MG tablet Take 500-1,000 mg by mouth every 6 (six) hours as needed for moderate pain (pain score 4-6).     Amoxicill-Rifabutin-Omeprazole (TALICIA) 250-12.5-10 MG CPDR Take 4 capsules by mouth in the morning, at noon, and at bedtime for 14 days. 168 capsule 0   baclofen (LIORESAL) 10 MG tablet Take 1 tablet (10 mg total) by mouth at bedtime. 30 each 3   benzonatate (TESSALON) 100 MG capsule Take 1 capsule (100 mg total) by mouth 2 (two) times daily as needed for cough. (Patient not taking: Reported on 08/19/2023) 20 capsule 0   buPROPion (WELLBUTRIN XL) 300 MG 24 hr tablet Take 1 tablet (300 mg total) by mouth daily. 30 tablet 2   cetirizine (ZYRTEC) 10 MG tablet Take 1  tablet (10 mg total) by mouth daily. 30 tablet 11   ferrous gluconate (FERGON) 324 MG tablet Take 1 tablet (324 mg total) by mouth daily with breakfast. 30 tablet 1   fluticasone (FLONASE) 50 MCG/ACT nasal spray Place 2 sprays into both nostrils daily. 16 g 6   Multiple Vitamin (MULTIVITAMIN) tablet Take 1 tablet by mouth daily.     naproxen sodium (ALEVE) 220 MG tablet Take 440 mg by mouth 2 (two) times daily as needed (Pain).     pantoprazole (PROTONIX) 40 MG tablet Take 1 tablet (40 mg total) by mouth 2 (two) times daily. (Patient taking differently: Take 80 mg by mouth 2 (two) times daily.) 180 tablet 0   No current facility-administered medications on file prior to visit.    Past Surgical History:  Procedure Laterality Date   CARPAL TUNNEL RELEASE     gastric balloon  01/28/2021   ocular muscle corrective surgery     SLEEVE GASTROPLASTY  2022    Allergies  Allergen Reactions   Metronidazole Other (See Comments)    Severe hypotension    BP 108/84   Ht 5' 0.63" (1.54 m)   Wt 138 lb (62.6 kg)   LMP 08/12/2023   BMI 26.39 kg/m  No data to display              No data to display              Objective:  Physical Exam:  Gen: NAD, comfortable in exam room  Left hip: No deformity. Full range of motion with 5/5 strength except 4/5 hip abduction. Tenderness to palpation greater trochanter. Neurovascularly intact distally. Negative logroll Negative faber, fadir, and piriformis stretches.  Right ankle: No gross deformity, swelling, ecchymoses Full range of motion Mild tenderness to palpation lateral aspect of ankle joint.  No other tenderness. Negative ant drawer and 1+ talar tilt.   NV intact distally.   Assessment & Plan:  1. Left hip pain - consistent with greater trochanteric pain syndrome.  Hip abductors still weak despite physical therapy - stressed importance of strengthening these.  Stretches given today also.  Voltaren gel, icing.  Follow up  in 6 weeks.  2. Right ankle pain - ? If she has an osteochondral defect given location and how she describes pain/what she was told.  She will send report to [place] through mychart.

## 2023-09-01 ENCOUNTER — Other Ambulatory Visit: Payer: Self-pay | Admitting: Oncology

## 2023-09-01 DIAGNOSIS — D509 Iron deficiency anemia, unspecified: Secondary | ICD-10-CM

## 2023-09-06 ENCOUNTER — Encounter: Payer: Self-pay | Admitting: Oncology

## 2023-09-06 ENCOUNTER — Inpatient Hospital Stay (HOSPITAL_BASED_OUTPATIENT_CLINIC_OR_DEPARTMENT_OTHER): Payer: Medicaid Other | Admitting: Oncology

## 2023-09-06 ENCOUNTER — Inpatient Hospital Stay: Payer: Medicaid Other | Attending: Oncology

## 2023-09-06 ENCOUNTER — Telehealth: Payer: Self-pay | Admitting: Oncology

## 2023-09-06 VITALS — BP 105/74 | HR 60 | Temp 98.2°F | Resp 18 | Ht 60.0 in | Wt 137.1 lb

## 2023-09-06 DIAGNOSIS — D72819 Decreased white blood cell count, unspecified: Secondary | ICD-10-CM | POA: Insufficient documentation

## 2023-09-06 DIAGNOSIS — D509 Iron deficiency anemia, unspecified: Secondary | ICD-10-CM | POA: Insufficient documentation

## 2023-09-06 LAB — CBC WITH DIFFERENTIAL (CANCER CENTER ONLY)
Abs Immature Granulocytes: 0.01 10*3/uL (ref 0.00–0.07)
Basophils Absolute: 0 10*3/uL (ref 0.0–0.1)
Basophils Relative: 1 %
Eosinophils Absolute: 0.1 10*3/uL (ref 0.0–0.5)
Eosinophils Relative: 3 %
HCT: 40.7 % (ref 36.0–46.0)
Hemoglobin: 13.2 g/dL (ref 12.0–15.0)
Immature Granulocytes: 0 %
Lymphocytes Relative: 52 %
Lymphs Abs: 1.8 10*3/uL (ref 0.7–4.0)
MCH: 25.5 pg — ABNORMAL LOW (ref 26.0–34.0)
MCHC: 32.4 g/dL (ref 30.0–36.0)
MCV: 78.6 fL — ABNORMAL LOW (ref 80.0–100.0)
Monocytes Absolute: 0.2 10*3/uL (ref 0.1–1.0)
Monocytes Relative: 7 %
Neutro Abs: 1.3 10*3/uL — ABNORMAL LOW (ref 1.7–7.7)
Neutrophils Relative %: 37 %
Platelet Count: 256 10*3/uL (ref 150–400)
RBC: 5.18 MIL/uL — ABNORMAL HIGH (ref 3.87–5.11)
RDW: 13.6 % (ref 11.5–15.5)
WBC Count: 3.5 10*3/uL — ABNORMAL LOW (ref 4.0–10.5)
nRBC: 0 % (ref 0.0–0.2)

## 2023-09-06 LAB — IRON AND TIBC
Iron: 37 ug/dL (ref 28–170)
Saturation Ratios: 13 % (ref 10.4–31.8)
TIBC: 294 ug/dL (ref 250–450)
UIBC: 257 ug/dL

## 2023-09-06 LAB — FERRITIN: Ferritin: 133 ng/mL (ref 11–307)

## 2023-09-06 NOTE — Progress Notes (Signed)
 [facility]  HEMATOLOGY CLINIC PROGRESS NOTE  PATIENT NAME: Morgan [name]   MR#: [id number] DOB: 03/22/1986  Patient Care Team: Morgan Kocher, DO as PCP - General (Family Medicine) Morgan Pyo, MD as PCP - Cardiology (Cardiology)  Date of visit: 09/06/2023   ASSESSMENT & PLAN:   Morgan [name] is a 38 y.o. lady with a past medical history of GERD, irritable bowel syndrome, s/p laparoscopic sleeve gastrectomy in 2022, arthritis, was referred to our service in December 2024 for evaluation of anemia.    Iron deficiency anemia Persistent despite long-term oral iron supplementation. Symptoms included fatigue, shortness of breath, palpitations, and chest tightness.   On her initial consultation with [place] on 06/01/2023, labs showed hemoglobin of 11.1, hematocrit 37, MCV 76.3.  White count 3800 with normal differential.  Platelet count normal at 273,000.  Iron studies continued to show evidence of iron deficiency with ferritin of 9, iron saturation of 5%, iron decreased at 23.  B12, folate, LDH, haptoglobin were within normal limits.  Coombs test negative.   Given persistent iron deficiency anemia, we proceeded with IV iron using Feraheme weekly x 2 doses.  Labs today revealed much improved hemoglobin of 13.2, MCV 78.6.  White count stable at 3500 with normal differential.  Platelet count normal at 256,000.  Iron studies showed no evidence of iron deficiency.  No indication for IV iron currently.   Patient apparently had testing for sickle cell disease, thalassemia in the past when she was living abroad (in [place]) and workup was negative per her verbal report.  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  -RTC in 3 months for repeat labs and follow-up.  Leukopenia Chronic low white blood cell count.  Apparently she had hematological workup [place] previously.  No known cause identified yet.   On her initial consultation with [place] on 06/01/2023, white count was  better at 3800 with normal differential.  B12, folate were within normal limits.  ANA negative.  White count is stable at 3500 today.  On recent ER visit, white count was normal at 8200, indicating adequate bone marrow response to stressful situations.  Picture consistent with benign neutropenia/leukopenia.   She is asymptomatic from leukopenia.  We can continue monitoring at current levels.   I spent a total of 25 minutes during this encounter with the patient including review of chart and various tests results, discussions about plan of care and coordination of care plan.  I reviewed lab results and outside records for this visit and discussed relevant results with the patient. Diagnosis, plan of care and treatment options were also discussed in detail with the patient. Opportunity provided to ask questions and answers provided to her apparent satisfaction. Provided instructions to call our clinic with any problems, questions or concerns prior to return visit. I recommended to continue follow-up with PCP and sub-specialists. She verbalized understanding and agreed with the plan. No barriers to learning was detected.  Morgan Crutch, MD  09/06/2023 4:46 PM  [facility] [address] [place] [address] Dept: [phone] Dept Fax: [phone]   CHIEF COMPLAINT/ REASON FOR VISIT:  Follow-up for iron deficiency anemia and chronic, mild leukopenia.  INTERVAL HISTORY:  Discussed the use of AI scribe software for clinical note transcription with the patient, who gave verbal consent to proceed.   Patient returns for follow-up.  She has a history of chronic gastrointestinal  symptoms, presents after a recent emergency room visit and endoscopy/colonoscopy. She reports a history of severe heartburn and was recently diagnosed with H. Pylori. She has been prescribed treatment for H. Pylori but has not  started due to ongoing gastrointestinal symptoms. She is currently on a high dose of Protonix (80mg  twice daily) for symptom management.  In addition to her gastrointestinal symptoms, the patient reports ongoing fatigue, shortness of breath with activity, and insomnia. These symptoms are typically associated with low hemoglobin levels, but the patient reports that she persists despite recent improvement in her hemoglobin level.  The patient also has a history of low white blood cell count, which has been fluctuating but was normal during a recent emergency room visit. She has been taking Fergon, an iron supplement, which she tolerates well.  SUMMARY OF HEMATOLOGIC HISTORY:  Labs at her PCPs office on 04/21/2023 showed hemoglobin of 10.8, hematocrit 36.7, MCV 74.  White count was 2900.  Platelet count was normal at 227,000.  Iron saturation was decreased at 7%, ferritin borderline low at 16.  Previously labs in April 2024 showed hemoglobin of 10.9, MCV 70.  White count was 3600 at that time.  Given persistent anemia, referral was sent to [place] for further evaluation.   She presents with persistent iron deficiency despite long-term oral iron supplementation. The patient reports experiencing fatigue, shortness of breath, palpitations, and occasional chest tightness. The patient also mentions having carpal tunnel surgery and experiencing generalized fatigue, myalgia, and bone pain. Despite these symptoms, the patient's hemoglobin levels remain around ten. The patient also reports a history of low white blood cell count. The patient has been on pantoprazole for over two years due to GERD and cannot tolerate NSAIDs due to severe gastritis. The patient also reports having heavy menstrual cycles.   She had not noticed any recent bleeding such as epistaxis, hematuria or hematochezia.  On her initial consultation with [place] on 06/01/2023, labs showed hemoglobin of 11.1, hematocrit 37, MCV 76.3.  White count 3800 with  normal differential.  Platelet count normal at 273,000.  Iron studies continued to show evidence of iron deficiency with ferritin of 9, iron saturation of 5%, iron decreased at 23.  B12, folate, LDH, haptoglobin were within normal limits.  Coombs test negative.   Given persistent iron deficiency anemia, we proceeded with IV iron using Feraheme weekly x 2 doses.   Patient apparently had testing for sickle cell disease, thalassemia in the past when she was living abroad (in [place]) and workup was negative per her verbal report.  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   Chronic low white blood cell count.  Apparently she had hematological workup overseas previously.  No known cause identified yet.   On her initial consultation with [place] on 06/01/2023, white count was better at 3800 with normal differential.  B12, folate were within normal limits.  ANA negative.   She is asymptomatic from leukopenia.  We can continue monitoring at current levels.  I have reviewed the past medical history, past surgical history, social history and family history with the patient and they are unchanged from previous note.  ALLERGIES: She is allergic to metronidazole.  MEDICATIONS:  Current Outpatient Medications  Medication Sig Dispense Refill   acetaminophen (TYLENOL) 500 MG tablet Take 500-1,000 mg by mouth every 6 (six) hours as needed for moderate pain (pain score 4-6).     baclofen (LIORESAL) 10 MG tablet Take 1 tablet (10 mg total) by mouth at bedtime. 30 each  3   buPROPion (WELLBUTRIN XL) 300 MG 24 hr tablet Take 1 tablet (300 mg total) by mouth daily. 30 tablet 2   cetirizine (ZYRTEC) 10 MG tablet Take 1 tablet (10 mg total) by mouth daily. 30 tablet 11   ferrous gluconate (FERGON) 324 MG tablet Take 1 tablet (324 mg total) by mouth daily with breakfast. 30 tablet 1   fluticasone (FLONASE) 50 MCG/ACT nasal spray Place 2 sprays into both nostrils daily. 16 g 6   ibuprofen (ADVIL) 800 MG tablet Take 800 mg  by mouth every 8 (eight) hours as needed.     Multiple Vitamin (MULTIVITAMIN) tablet Take 1 tablet by mouth daily.     pantoprazole (PROTONIX) 40 MG tablet Take 1 tablet (40 mg total) by mouth 2 (two) times daily. (Patient taking differently: Take 80 mg by mouth 2 (two) times daily.) 180 tablet 0   Amoxicill-Rifabutin-Omeprazole (TALICIA) 250-12.5-10 MG CPDR Take 4 capsules by mouth in the morning, at noon, and at bedtime for 14 days. (Patient not taking: Reported on 09/06/2023) 168 capsule 0   naproxen sodium (ALEVE) 220 MG tablet Take 440 mg by mouth 2 (two) times daily as needed (Pain). (Patient not taking: Reported on 09/06/2023)     No current facility-administered medications for this visit.     REVIEW OF SYSTEMS:    Review of Systems  Respiratory:  Positive for shortness of breath (with exertion).   Cardiovascular:  Positive for leg swelling.  Neurological:  Positive for dizziness.   All other pertinent systems were reviewed with the patient and are negative.  PHYSICAL EXAMINATION:    Onc Performance Status - 09/06/23 1055       ECOG Perf Status   ECOG Perf Status Ambulatory and capable of all selfcare but unable to carry out any work activities.  Up and about more than 50% of waking hours      KPS SCALE   KPS % SCORE Cares for self, unable to carry on normal activity or to do active work             Vitals:   09/06/23 1047  BP: 105/74  Pulse: 60  Resp: 18  Temp: 98.2 F (36.8 C)  SpO2: 100%   Filed Weights   09/06/23 1047  Weight: 137 lb 1.6 oz (62.2 kg)    Physical Exam Constitutional:      General: She is not in acute distress.    Appearance: Normal appearance.  HENT:     Head: Normocephalic and atraumatic.  Cardiovascular:     Rate and Rhythm: Normal rate and regular rhythm.  Pulmonary:     Effort: Pulmonary effort is normal.  Abdominal:     General: There is no distension.  Neurological:     General: No focal deficit present.     Mental  Status: She is alert and oriented to person, place, and time.  Psychiatric:        Mood and Affect: Mood normal.        Behavior: Behavior normal.        Thought Content: Thought content normal.     LABORATORY DATA:   I have reviewed the data as listed.  Results for orders placed or performed in visit on 09/06/23  Ferritin  Result Value Ref Range   Ferritin 133 11 - 307 ng/mL  Iron and TIBC  Result Value Ref Range   Iron 37 28 - 170 ug/dL   TIBC 454 098 - 119 ug/dL  Saturation Ratios 13 10.4 - 31.8 %   UIBC 257 ug/dL  CBC with Differential (Cancer Center Only)  Result Value Ref Range   WBC Count 3.5 (L) 4.0 - 10.5 K/uL   RBC 5.18 (H) 3.87 - 5.11 MIL/uL   Hemoglobin 13.2 12.0 - 15.0 g/dL   HCT 16.1 09.6 - 04.5 %   MCV 78.6 (L) 80.0 - 100.0 fL   MCH 25.5 (L) 26.0 - 34.0 pg   MCHC 32.4 30.0 - 36.0 g/dL   RDW 40.9 81.1 - 91.4 %   Platelet Count 256 150 - 400 K/uL   nRBC 0.0 0.0 - 0.2 %   Neutrophils Relative % 37 %   Neutro Abs 1.3 (L) 1.7 - 7.7 K/uL   Lymphocytes Relative 52 %   Lymphs Abs 1.8 0.7 - 4.0 K/uL   Monocytes Relative 7 %   Monocytes Absolute 0.2 0.1 - 1.0 K/uL   Eosinophils Relative 3 %   Eosinophils Absolute 0.1 0.0 - 0.5 K/uL   Basophils Relative 1 %   Basophils Absolute 0.0 0.0 - 0.1 K/uL   Immature Granulocytes 0 %   Abs Immature Granulocytes 0.01 0.00 - 0.07 K/uL    RADIOGRAPHIC STUDIES:  No recent pertinent imaging studies available to review.  Orders Placed This Encounter  Procedures   CBC with Differential (Cancer Center Only)    Standing Status:   Future    Expected Date:   12/07/2023    Expiration Date:   09/05/2024   Iron and TIBC    Standing Status:   Future    Expected Date:   12/07/2023    Expiration Date:   09/05/2024   Ferritin    Standing Status:   Future    Expected Date:   12/07/2023    Expiration Date:   09/05/2024     Future Appointments  Date Time Provider Department Center  09/08/2023  3:20 PM Bowen, Scot Jun, DO [facility]  None  10/11/2023  9:00 AM Lenda Kelp, MD [facility]  10/19/2023  8:30 AM McGreal, Durene Romans, MD [facility]  12/07/2023 10:15 AM [facility] PHLEBOTOMIST [facility] None  12/07/2023 10:45 AM Latron Ribas, Archie Patten, MD [facility] None     This document was completed utilizing speech recognition software. Grammatical errors, random word insertions, pronoun errors, and incomplete sentences are an occasional consequence of this system due to software limitations, ambient noise, and hardware issues. Any formal questions or concerns about the content, text or information contained within the body of this dictation should be directly addressed to the provider for clarification.

## 2023-09-06 NOTE — Telephone Encounter (Signed)
 Spoke with patient confirming upcoming appointment

## 2023-09-06 NOTE — Assessment & Plan Note (Addendum)
 Persistent despite long-term oral iron supplementation. Symptoms included fatigue, shortness of breath, palpitations, and chest tightness.   On her initial consultation with [place] on 06/01/2023, labs showed hemoglobin of 11.1, hematocrit 37, MCV 76.3.  White count 3800 with normal differential.  Platelet count normal at 273,000.  Iron studies continued to show evidence of iron deficiency with ferritin of 9, iron saturation of 5%, iron decreased at 23.  B12, folate, LDH, haptoglobin were within normal limits.  Coombs test negative.   Given persistent iron deficiency anemia, we proceeded with IV iron using Feraheme weekly x 2 doses.  Labs today revealed much improved hemoglobin of 13.2, MCV 78.6.  White count stable at 3500 with normal differential.  Platelet count normal at 256,000.  Iron studies showed no evidence of iron deficiency.  No indication for IV iron currently.   Patient apparently had testing for sickle cell disease, thalassemia in the past when she was living abroad (in [place]) and workup was negative per her verbal report.  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  -RTC in 3 months for repeat labs and follow-up.

## 2023-09-06 NOTE — Assessment & Plan Note (Signed)
 Chronic low white blood cell count.  [place].  No known cause identified yet.   On her initial consultation with [place] on 06/01/2023, white count was better at 3800 with normal differential.  B12, folate were within normal limits.  ANA negative.  White count is stable at 3500 today.  On recent ER visit, white count was normal at 8200, indicating adequate bone marrow response to stressful situations.  Picture consistent with benign neutropenia/leukopenia.   She is asymptomatic from leukopenia.  We can continue monitoring at current levels.

## 2023-09-08 ENCOUNTER — Encounter: Payer: Self-pay | Admitting: Family Medicine

## 2023-09-08 ENCOUNTER — Telehealth: Payer: Self-pay | Admitting: Pediatrics

## 2023-09-08 ENCOUNTER — Other Ambulatory Visit (HOSPITAL_COMMUNITY): Payer: Self-pay

## 2023-09-08 ENCOUNTER — Other Ambulatory Visit: Payer: Self-pay

## 2023-09-08 ENCOUNTER — Telehealth: Payer: Self-pay

## 2023-09-08 ENCOUNTER — Encounter: Payer: Self-pay | Admitting: Oncology

## 2023-09-08 ENCOUNTER — Ambulatory Visit: Payer: Medicaid Other | Admitting: Family Medicine

## 2023-09-08 VITALS — BP 107/70 | HR 59 | Temp 98.7°F | Ht 60.0 in | Wt 134.0 lb

## 2023-09-08 DIAGNOSIS — Z9884 Bariatric surgery status: Secondary | ICD-10-CM | POA: Diagnosis not present

## 2023-09-08 DIAGNOSIS — E669 Obesity, unspecified: Secondary | ICD-10-CM

## 2023-09-08 DIAGNOSIS — A048 Other specified bacterial intestinal infections: Secondary | ICD-10-CM

## 2023-09-08 DIAGNOSIS — F5089 Other specified eating disorder: Secondary | ICD-10-CM

## 2023-09-08 DIAGNOSIS — F3289 Other specified depressive episodes: Secondary | ICD-10-CM

## 2023-09-08 DIAGNOSIS — Z6826 Body mass index (BMI) 26.0-26.9, adult: Secondary | ICD-10-CM

## 2023-09-08 DIAGNOSIS — M25552 Pain in left hip: Secondary | ICD-10-CM | POA: Diagnosis not present

## 2023-09-08 DIAGNOSIS — B9681 Helicobacter pylori [H. pylori] as the cause of diseases classified elsewhere: Secondary | ICD-10-CM | POA: Diagnosis not present

## 2023-09-08 DIAGNOSIS — K219 Gastro-esophageal reflux disease without esophagitis: Secondary | ICD-10-CM

## 2023-09-08 DIAGNOSIS — D509 Iron deficiency anemia, unspecified: Secondary | ICD-10-CM

## 2023-09-08 MED ORDER — PANTOPRAZOLE SODIUM 40 MG PO TBEC
40.0000 mg | DELAYED_RELEASE_TABLET | Freq: Two times a day (BID) | ORAL | 0 refills | Status: DC
  Filled 2023-09-08 – 2023-10-10 (×3): qty 180, 90d supply, fill #0

## 2023-09-08 MED ORDER — TALICIA 250-12.5-10 MG PO CPDR
4.0000 | DELAYED_RELEASE_CAPSULE | Freq: Three times a day (TID) | ORAL | 0 refills | Status: DC
Start: 2023-09-08 — End: 2023-09-22
  Filled 2023-09-08: qty 168, 14d supply, fill #0

## 2023-09-08 MED ORDER — LOMAIRA 8 MG PO TABS
8.0000 mg | ORAL_TABLET | Freq: Every day | ORAL | 0 refills | Status: DC
  Filled 2023-09-08: qty 30, 30d supply, fill #0

## 2023-09-08 MED ORDER — BUPROPION HCL ER (XL) 300 MG PO TB24
300.0000 mg | ORAL_TABLET | Freq: Every day | ORAL | 2 refills | Status: DC
  Filled 2023-09-08 – 2023-10-10 (×3): qty 30, 30d supply, fill #0

## 2023-09-08 NOTE — Progress Notes (Signed)
 Office: [phone]  /  Fax: [phone]  WEIGHT SUMMARY AND BIOMETRICS  Starting Date: 10/07/22  Starting Weight: 154lb   Weight Lost Since Last Visit: 0lb   Vitals Temp: 98.7 F (37.1 C) BP: 107/70 Pulse Rate: (!) 59 SpO2: 99 %   Body Composition  Body Fat %: 39 % Fat Mass (lbs): 52.4 lbs Muscle Mass (lbs): 78 lbs Total Body Water (lbs): 66 lbs Visceral Fat Rating : 6    HPI  Chief Complaint: OBESITY  Morgan [name] is here to discuss her progress with her obesity treatment plan. She is on the following a lower carbohydrate, vegetable and lean protein rich diet plan and states she is following her eating plan approximately 60 % of the time. She states she is exercising 0 minutes 0 times per week.  Interval History:  Since last office visit she is down 0 lb She is happy with her 20 lb weight loss over the past 10 mos of medically supervised weight management Hunger and cravings have been worse in the morning She is in the middle of Ramadan but denies late night eating She has not been able to exercise due to L hip pain, recent dx of H Pylori infection  She has had a period q 2 weeks this month (has IUD)  Pharmacotherapy: Wellbutrin XL 300 mg daily  PHYSICAL EXAM:  Blood pressure 107/70, pulse (!) 59, temperature 98.7 F (37.1 C), height 5' (1.524 m), weight 134 lb (60.8 kg), last menstrual period 08/12/2023, SpO2 99%, unknown if currently breastfeeding. Body mass index is 26.17 kg/m.  General: She is healthy appearing,  cooperative, alert, well developed, and in no acute distress. PSYCH: Has normal mood, affect and thought process.   Lungs: Normal breathing effort, no conversational dyspnea.   ASSESSMENT AND PLAN  TREATMENT PLAN FOR OBESITY:  Recommended Dietary Goals  Morgan [name] is currently in the action stage of change. As such, her goal is to continue weight management plan. She has agreed to practicing portion control and making smarter food choices, such  as increasing vegetables and decreasing simple carbohydrates.  Behavioral Intervention  We discussed the following Behavioral Modification Strategies today: increasing lean protein intake to established goals, increasing fiber rich foods, increasing water intake , work on meal planning and preparation, keeping healthy foods at home, decreasing eating out or consumption of processed foods, and making healthy choices when eating convenient foods, practice mindfulness eating and understand the difference between hunger signals and cravings, work on managing stress, creating time for self-care and relaxation, avoiding temptations and identifying enticing environmental cues, and continue to work on maintaining a reduced calorie state, getting the recommended amount of protein, incorporating whole foods, making healthy choices, staying well hydrated and practicing mindfulness when eating..  Additional resources provided today: NA  Recommended Physical Activity Goals  Morgan [name] has been advised to work up to 150 minutes of moderate intensity aerobic activity a week and strengthening exercises 2-3 times per week for cardiovascular health, weight loss maintenance and preservation of muscle mass.   She has agreed to Think about enjoyable ways to increase daily physical activity and overcoming barriers to exercise and Increase physical activity in their day and reduce sedentary time (increase NEAT).  Pharmacotherapy changes for the treatment of obesity: begin Lomaira 8 mg in the morning once daily  ASSOCIATED CONDITIONS ADDRESSED TODAY  Iron deficiency anemia, unspecified iron deficiency anemia type Lab Results  Component Value Date   IRON 37 09/06/2023   TIBC 294 09/06/2023   FERRITIN  133 09/06/2023   Lab Results  Component Value Date   WBC 3.5 (L) 09/06/2023   HGB 13.2 09/06/2023   HCT 40.7 09/06/2023   MCV 78.6 (L) 09/06/2023   PLT 256 09/06/2023   She had been taking ferrous gluconate daily but  has paused it during IV iron infusions with Morgan [name].  Energy level stable and she has been instructed to resume oral iron.  She is having more frequent menses and she plans to talk to her OB about this.  Gastroesophageal reflux disease, unspecified whether esophagitis present She has been instructed to resume PPI treatment once she completes H Pylori treatment EGD/ colonoscopy updated by Morgan [name] -     Pantoprazole Sodium; Take 1 tablet (40 mg total) by mouth 2 (two) times daily.  Dispense: 180 tablet; Refill: 0  Other depression with emotional eating Worsening Reports stable mood but has not been feeling very well Improved some on Buproprion XL 300 mg daily -     buPROPion HCl ER (XL); Take 1 tablet (300 mg total) by mouth daily.  Dispense: 30 tablet; Refill: 2  Generalized obesity -     Lomaira; Take 1 tablet (8 mg total) by mouth daily 30 minutes before breakfast.  Dispense: 30 tablet; Refill: 0  BMI 26.0-26.9,adult  Left hip pain L hip pain, seen by Morgan [name] presumed greater trochanteric pain syndrome Working on home PT exercises and topical voltaren  Look for improvements so that she may ramp up exercise for maintenance of weight loss  S/P laparoscopic sleeve gastrectomy  H. pylori infection Awaiting treatment start of Talicia     She was informed of the importance of frequent follow up visits to maximize her success with intensive lifestyle modifications for her multiple health conditions.   ATTESTASTION STATEMENTS:  Reviewed by clinician on day of visit: allergies, medications, problem list, medical history, surgical history, family history, social history, and previous encounter notes pertinent to obesity diagnosis.   I have personally spent 30 minutes total time today in preparation, patient care, nutritional counseling and documentation for this visit, including the following: review of clinical lab tests; review of medical tests/procedures/services.      Morgan Brink, DO DABFM, DABOM [facility] [address] [place], [place] [address] [phone]

## 2023-09-08 NOTE — Telephone Encounter (Signed)
 Inbound call from patient requesting for Talicia medication to be sent [facility] at [facility]. Please advise, thank you.

## 2023-09-08 NOTE — Telephone Encounter (Signed)
 Patient called requesting prescription sent to [facility] pharmacy, sent prescription to [facility] left message on patient voice mail.

## 2023-09-09 ENCOUNTER — Other Ambulatory Visit (HOSPITAL_COMMUNITY): Payer: Self-pay

## 2023-09-09 DIAGNOSIS — N921 Excessive and frequent menstruation with irregular cycle: Secondary | ICD-10-CM | POA: Diagnosis not present

## 2023-09-09 DIAGNOSIS — Z30432 Encounter for removal of intrauterine contraceptive device: Secondary | ICD-10-CM | POA: Diagnosis not present

## 2023-09-09 DIAGNOSIS — Z30016 Encounter for initial prescription of transdermal patch hormonal contraceptive device: Secondary | ICD-10-CM | POA: Diagnosis not present

## 2023-09-09 DIAGNOSIS — N920 Excessive and frequent menstruation with regular cycle: Secondary | ICD-10-CM | POA: Diagnosis not present

## 2023-09-09 NOTE — Telephone Encounter (Signed)
 Inbound call from patient stating she is returning phone call. Please advise, thank you.

## 2023-09-09 NOTE — Telephone Encounter (Signed)
 Patient called and stated that when she spoke to [facility] they had advised her that her insurance did not cover this medication called Amoxicill-Rifaburtin-Omeprazole and she would need a generic form of that antibiotic sent over to them. Patient is requesting a call back. Please advise.

## 2023-09-10 ENCOUNTER — Encounter: Payer: Self-pay | Admitting: Oncology

## 2023-09-10 ENCOUNTER — Other Ambulatory Visit (HOSPITAL_COMMUNITY): Payer: Self-pay

## 2023-09-10 MED ORDER — AMOXICILLIN 500 MG PO TABS: 1000.0000 mg | ORAL_TABLET | Freq: Three times a day (TID) | ORAL | 0 refills | Status: AC

## 2023-09-10 MED ORDER — RIFABUTIN 150 MG PO CAPS: 150.0000 mg | ORAL_CAPSULE | Freq: Three times a day (TID) | ORAL | 0 refills | Status: AC

## 2023-09-10 NOTE — Telephone Encounter (Signed)
 I have spoken to patient to advise of insurance requirement to "break down" Talicia components for coverage. She verbalizes understanding.  Dr Doy Hutching-  To clarify,  #1- Patient already takes pantoprazole 40 mg twice daily. Can she just remain on this rather than taking the omeprazole?  #2- I am unable to locate Rifabutin in a 50 mg dosage. Would Rifabutin 150 mg be appropriate?  Thank you!

## 2023-09-10 NOTE — Addendum Note (Signed)
 Addended by: Richardson Chiquito on: 09/10/2023 01:52 PM   Modules accepted: Orders

## 2023-09-13 ENCOUNTER — Encounter: Payer: Self-pay | Admitting: Family Medicine

## 2023-09-14 ENCOUNTER — Other Ambulatory Visit (HOSPITAL_COMMUNITY): Payer: Self-pay

## 2023-09-14 ENCOUNTER — Telehealth: Payer: Self-pay

## 2023-09-14 ENCOUNTER — Encounter: Payer: Self-pay | Admitting: Oncology

## 2023-09-14 NOTE — Telephone Encounter (Addendum)
 PA team, can you help with this at all? We were told that insurance would not cover Talicia but would instead cover the "breakdown" of medications. That breakdown was sent and now insurance is saying they will not cover the rifabutin? Would you be able to provide clarification for this so we can get this patient the right medication? Thank you for your help!   I have contacted the patient to let her know we are inquiring about this problem.

## 2023-09-14 NOTE — Telephone Encounter (Signed)
 Inbound call in reference to Rifabutin. Insurance will not cover it and the patient has concerns as to what happens now. Please advise.

## 2023-09-14 NOTE — Telephone Encounter (Signed)
 Test claim shows too soon to fill. Normally this means there is no issue but I submitted a PA request just in case. Insurance came back saying that the patient has already filled it which means there were no issues.

## 2023-09-14 NOTE — Telephone Encounter (Signed)
 Pharmacy Patient Advocate Encounter   Received notification from Pt Calls Messages that prior authorization for Rifabutin 150MG  capsules is required/requested.   Insurance verification completed.   The patient is insured through [facility] .   Per test claim: Medication is available without a prior authorization.   Per ins: The member recently filled this medication and will be able to return for their next refill according to their plan limits

## 2023-09-15 NOTE — Telephone Encounter (Signed)
 Spoke to pharmacist at [facility]. He states that patient did pick up rifabutin for 4 dollar copay. Amoxicillin prescription is at the pharmacy and he states that he is unsure why this was not filled, but will fill it right away and this should go through for 4 dollars as well. I have spoken to patient to advise that she should be able to pick up amoxicillin at the pharmacy today and should take this and rifabutin together for H Pylori treatment. She verbalizes understanding.

## 2023-09-22 ENCOUNTER — Other Ambulatory Visit: Payer: Self-pay | Admitting: Family Medicine

## 2023-09-22 ENCOUNTER — Other Ambulatory Visit (HOSPITAL_COMMUNITY): Payer: Self-pay

## 2023-09-22 DIAGNOSIS — K219 Gastro-esophageal reflux disease without esophagitis: Secondary | ICD-10-CM

## 2023-09-24 DIAGNOSIS — N921 Excessive and frequent menstruation with irregular cycle: Secondary | ICD-10-CM | POA: Diagnosis not present

## 2023-09-24 DIAGNOSIS — Z3045 Encounter for surveillance of transdermal patch hormonal contraceptive device: Secondary | ICD-10-CM | POA: Diagnosis not present

## 2023-09-28 ENCOUNTER — Other Ambulatory Visit: Payer: Self-pay

## 2023-09-28 ENCOUNTER — Other Ambulatory Visit (HOSPITAL_COMMUNITY): Payer: Self-pay

## 2023-09-28 MED ORDER — AMOXICILLIN 500 MG PO CAPS
500.0000 mg | ORAL_CAPSULE | Freq: Three times a day (TID) | ORAL | 0 refills | Status: DC
  Filled 2023-09-28 (×2): qty 21, 7d supply, fill #0

## 2023-09-28 MED ORDER — IBUPROFEN 800 MG PO TABS
800.0000 mg | ORAL_TABLET | Freq: Three times a day (TID) | ORAL | 0 refills | Status: DC | PRN
  Filled 2023-09-28: qty 20, 7d supply, fill #0

## 2023-09-28 MED ORDER — CHLORHEXIDINE GLUCONATE 0.12 % MT SOLN
OROMUCOSAL | 0 refills | Status: DC
  Filled 2023-09-28: qty 473, 10d supply, fill #0

## 2023-10-11 ENCOUNTER — Other Ambulatory Visit (HOSPITAL_COMMUNITY): Payer: Self-pay

## 2023-10-11 ENCOUNTER — Encounter: Payer: Self-pay | Admitting: Family Medicine

## 2023-10-11 ENCOUNTER — Ambulatory Visit: Admitting: Family Medicine

## 2023-10-11 ENCOUNTER — Telehealth: Payer: Self-pay

## 2023-10-11 VITALS — BP 100/70 | Ht 60.0 in | Wt 134.0 lb

## 2023-10-11 DIAGNOSIS — G8929 Other chronic pain: Secondary | ICD-10-CM

## 2023-10-11 DIAGNOSIS — M25571 Pain in right ankle and joints of right foot: Secondary | ICD-10-CM

## 2023-10-11 NOTE — Patient Instructions (Signed)
 Get updated x-rays of your ankle after you leave today.  We will go ahead with an MRI to stage your osteochondral defect and plan to refer you to Dr. Hulda Mage. For your hip do the home exercises 3-4 times a week for 6 more weeks. Follow up with me in 6 weeks or as needed if you're doing well.

## 2023-10-11 NOTE — Progress Notes (Signed)
 PCP: Lavada Porteous, DO  Subjective:   HPI: Patient is a 38 y.o. female here for follow up left hip and right ankle pain.  Left hip feels better. Finished PT sessions, still doing home exercises which has been helping. Patient states she is still a little tender over the greater trochanter, but not as severe as prior. No numbness or tingling. Not taking any medications for pain.    Right ankle worse with prolonged standing, wearing heels, prolonged walking, and exercise. Pain is described as sharp on the lateral ankle. Pain is intermittent and chronic since high school. Not able to walk flat on foot during pain episodes. Reported that ankle pain initially started after twisting her ankle in high school. Pain radiates to tibia/fibula sometimes. No numbness or tingling. Interested in getting an updated MRI of ankle.    Past Medical History:  Diagnosis Date   Anemia    Back pain    Complication of anesthesia    Patient states when given the epidural the numbness went up to her nose.    Edema    GERD (gastroesophageal reflux disease)    Heartburn    Hepatitis    Hepatitis B carrier (HCC)    IBS (irritable bowel syndrome)    Obesity    Osteoarthritis    Palpitations    Pneumonia    Vitamin D  deficiency     Current Outpatient Medications on File Prior to Visit  Medication Sig Dispense Refill   acetaminophen  (TYLENOL ) 500 MG tablet Take 500-1,000 mg by mouth every 6 (six) hours as needed for moderate pain (pain score 4-6).     amoxicillin  (AMOXIL ) 500 MG capsule Take 1 capsule (500 mg total) by mouth every 8 (eight) hours. 21 capsule 0   baclofen  (LIORESAL ) 10 MG tablet Take 1 tablet (10 mg total) by mouth at bedtime. 30 each 3   buPROPion  (WELLBUTRIN  XL) 300 MG 24 hr tablet Take 1 tablet (300 mg total) by mouth daily. 30 tablet 2   cetirizine  (ZYRTEC ) 10 MG tablet Take 1 tablet (10 mg total) by mouth daily. 30 tablet 11   chlorhexidine  (PERIDEX ) 0.12 % solution Swish for one minute  then expectorate--use twice daily for 10 days. 473 mL 0   ferrous gluconate  (FERGON) 324 MG tablet Take 1 tablet (324 mg total) by mouth daily with breakfast. 30 tablet 1   fluticasone  (FLONASE ) 50 MCG/ACT nasal spray Place 2 sprays into both nostrils daily. 16 g 6   ibuprofen  (ADVIL ) 800 MG tablet Take 800 mg by mouth every 8 (eight) hours as needed.     ibuprofen  (ADVIL ) 800 MG tablet Take 1 tablet every 8 hours as needed for pain 20 tablet 0   Multiple Vitamin (MULTIVITAMIN) tablet Take 1 tablet by mouth daily.     naproxen  sodium (ALEVE ) 220 MG tablet Take 440 mg by mouth 2 (two) times daily as needed (Pain). (Patient not taking: Reported on 09/06/2023)     pantoprazole  (PROTONIX ) 40 MG tablet Take 1 tablet (40 mg total) by mouth 2 (two) times daily. 180 tablet 0   Phentermine  HCl (LOMAIRA ) 8 MG TABS Take 1 tablet (8 mg total) by mouth daily 30 minutes before breakfast. 30 tablet 0   No current facility-administered medications on file prior to visit.    Past Surgical History:  Procedure Laterality Date   CARPAL TUNNEL RELEASE     gastric balloon  01/28/2021   ocular muscle corrective surgery     SLEEVE GASTROPLASTY  2022  Allergies  Allergen Reactions   Metronidazole Other (See Comments)    Severe hypotension    BP 100/70 (BP Location: Left Arm, Patient Position: Sitting)   Ht 5' (1.524 m)   Wt 134 lb (60.8 kg)   BMI 26.17 kg/m       No data to display              No data to display              Objective:  Physical Exam:  Gen: NAD, comfortable in exam room  MSK:   Left hip:   No obvious deformity  Full range of motion   Strength 5/5 of abduction and adduction   Mild tenderness to palpation over greater trochanter  Negative faber and fadir tests   Neurovascularly intact distally   Right ankle:   No gross deformity, swelling, or ecchymoses  Full range of motion   Mild tenderness to palpation below the lateral malleolus Full strength on  plantarflexion and dorsiflexion  NV intact distally    Assessment & Plan:  1. Left hip pain- improving greater trochanteric pain syndrome. Strength of hip abductors improved since initial visit. Still having mild tenderness at the greater trochanter. Patient has graduated from PT, but encouraged to continue home exercises 3-4x/week for the next 6 weeks for complete resolution of pain.   2. 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 Discussed getting an updated MRI and referral to foot and ankle surgeon to discuss next steps and treatment plan.  Keerat Denicola B. Seferino Dade, MS4

## 2023-10-11 NOTE — Telephone Encounter (Signed)
 From: Glennette Lanius, RN  Sent: 10/11/2023  12:00 AM EDT  To: Glennette Lanius, RN   Pt needs h pylori antigen around 10/18/23. Place orders and call patient. See 08/24/23 patient message for original order. Mcgreal pt

## 2023-10-11 NOTE — Telephone Encounter (Signed)
 Patient has an OV on 4/29. Note has been added that patient needs H. Pylori [employer] test

## 2023-10-13 ENCOUNTER — Ambulatory Visit: Admitting: Family Medicine

## 2023-10-13 ENCOUNTER — Encounter: Payer: Self-pay | Admitting: Family Medicine

## 2023-10-13 VITALS — BP 98/67 | HR 55 | Temp 98.2°F | Ht 60.0 in | Wt 132.0 lb

## 2023-10-13 DIAGNOSIS — F5089 Other specified eating disorder: Secondary | ICD-10-CM

## 2023-10-13 DIAGNOSIS — F3289 Other specified depressive episodes: Secondary | ICD-10-CM | POA: Diagnosis not present

## 2023-10-13 DIAGNOSIS — K219 Gastro-esophageal reflux disease without esophagitis: Secondary | ICD-10-CM

## 2023-10-13 DIAGNOSIS — Z6825 Body mass index (BMI) 25.0-25.9, adult: Secondary | ICD-10-CM

## 2023-10-13 DIAGNOSIS — R632 Polyphagia: Secondary | ICD-10-CM

## 2023-10-13 DIAGNOSIS — Z8639 Personal history of other endocrine, nutritional and metabolic disease: Secondary | ICD-10-CM

## 2023-10-13 DIAGNOSIS — R110 Nausea: Secondary | ICD-10-CM | POA: Diagnosis not present

## 2023-10-13 DIAGNOSIS — E663 Overweight: Secondary | ICD-10-CM | POA: Diagnosis not present

## 2023-10-13 DIAGNOSIS — Z9884 Bariatric surgery status: Secondary | ICD-10-CM

## 2023-10-13 DIAGNOSIS — D509 Iron deficiency anemia, unspecified: Secondary | ICD-10-CM | POA: Diagnosis not present

## 2023-10-13 MED ORDER — LOMAIRA 8 MG PO TABS: 8.0000 mg | ORAL_TABLET | Freq: Every day | ORAL | 0 refills | Status: DC

## 2023-10-13 MED ORDER — BUPROPION HCL ER (XL) 300 MG PO TB24: 300.0000 mg | ORAL_TABLET | Freq: Every day | ORAL | 2 refills | Status: DC

## 2023-10-13 MED ORDER — ONDANSETRON HCL 4 MG PO TABS: 4.0000 mg | ORAL_TABLET | Freq: Three times a day (TID) | ORAL | 0 refills | Status: AC | PRN

## 2023-10-13 MED ORDER — PANTOPRAZOLE SODIUM 40 MG PO TBEC: 40.0000 mg | DELAYED_RELEASE_TABLET | Freq: Two times a day (BID) | ORAL | 0 refills | Status: DC

## 2023-10-13 NOTE — Progress Notes (Signed)
 Office: [phone]  /  Fax: [phone]  WEIGHT SUMMARY AND BIOMETRICS  Starting Date: 10/07/22  Starting Weight: 154lb   Weight Lost Since Last Visit: 2lb   Vitals Temp: 98.2 F (36.8 C) BP: 98/67 Pulse Rate: (!) 55 SpO2: 99 %   Body Composition  Body Fat %: 38.6 % Fat Mass (lbs): 51 lbs Muscle Mass (lbs): 76.8 lbs Total Body Water (lbs): 64 lbs Visceral Fat Rating : 6     HPI  Chief Complaint: OBESITY  Morgan [name] is here to discuss her progress with her obesity treatment plan. She is on the following a lower carbohydrate, vegetable and lean protein rich diet plan and states she is following her eating plan approximately 50 % of the time. She states she is exercising 0 minutes 0 times per week.   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  This has been happening more with sweets, even fruits Avoiding high fat foods She is using a birth control patch now but is still bleeding q 2 weeks with heavy menses Her net weight loss is 22 lb in 1 year of medically supervised weight management This is a 14.2% TBW loss Back on oral iron with fair energy level She is still fasting for Ramadan She has had more nausea while on antibiotics for H Pylori (taking with food) Lomaira  has helped with hunger without adverse SE  Pharmacotherapy: Lomaira  8 mg qAM  PHYSICAL EXAM:  Blood pressure 98/67, pulse (!) 55, temperature 98.2 F (36.8 C), height 5' (1.524 m), weight 132 lb (59.9 kg), last menstrual period 09/24/2023, SpO2 99%, unknown if currently breastfeeding. Body mass index is 25.78 kg/m.  General: She is healthy appearing, cooperative, alert, well developed, and in no acute distress. PSYCH: Has normal mood, affect and thought process.   Lungs: Normal breathing effort, no conversational dyspnea.   ASSESSMENT AND  PLAN  TREATMENT PLAN FOR OBESITY:  Recommended Dietary Goals  Morgan [name] is currently in the action stage of change. As such, her goal is to continue weight management plan. She has agreed to practicing portion control and making smarter food choices, such as increasing vegetables and decreasing simple carbohydrates.  Behavioral Intervention  We discussed the following Behavioral Modification Strategies today: increasing lean protein intake to established goals, increasing fiber rich foods, increasing water intake , work on meal planning and preparation, work on tracking and journaling calories using tracking application, keeping healthy foods at home, identifying sources and decreasing liquid calories, practice mindfulness eating and understand the difference between hunger signals and cravings, planning for success, and continue to work on maintaining a reduced calorie state, getting the recommended amount of protein, incorporating whole foods, making healthy choices, staying well hydrated and practicing mindfulness when eating.. Avoid eating fruit by itself due to dumping Avoid high fat/ high sugar foods Reviewed easy high protein small meals  Additional resources provided today: NA  Recommended Physical Activity Goals  Morgan [name] has been advised to work up to 150 minutes of moderate intensity aerobic activity a week and strengthening exercises 2-3 times per week for cardiovascular health, weight loss maintenance and preservation of muscle mass.   She has agreed to Think about enjoyable ways to increase daily physical activity and overcoming barriers to exercise and Increase physical activity in their day and reduce sedentary time (increase  NEAT). 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  Dispense: 20 tablet; Refill: 0  Other depression with emotional eating improving -     buPROPion  HCl ER (XL); Take 1 tablet (300 mg total) by mouth daily.  Dispense: 30 tablet; Refill: 2  Gastroesophageal reflux disease, unspecified whether esophagitis present F/u with GI next week, still on treatment for H Pylori -     Pantoprazole  Sodium; Take 1 tablet (40 mg total) by mouth 2 (two) times daily.  Dispense: 180 tablet; Refill: 0  Overweight (BMI 25.0-29.9)  History of obesity Improving.  Body fat goal: 35%  S/p VSG in 2022.  Happy with progress at nadir weight  Polyphagia BP/ HR at goal Has BC patch   -     Lomaira ; Take 1 tablet (8 mg total) by mouth daily 30 minutes before breakfast.  Dispense: 30 tablet; Refill: 0  S/P laparoscopic sleeve gastrectomy Continue MVI daily Continue small portions with lean protein and fiber at meals Water intake between meals  Iron deficiency anemia, unspecified iron deficiency anemia type Continue oral iron supplement F/u with hematology and OB for DUB     She was informed of the importance of frequent follow up visits to maximize her success with intensive lifestyle modifications for her multiple health conditions.   ATTESTASTION STATEMENTS:  Reviewed by clinician on day of visit: allergies, medications, problem list, medical history, surgical history, family history, social history, and previous encounter notes pertinent to obesity diagnosis.   I have personally spent 30 minutes total time today in preparation, patient care, nutritional counseling and education,  and documentation for this visit, including the following: review of most recent clinical lab tests, prescribing medications/ refilling medications, reviewing medical assistant documentation, review and interpretation of bioimpedence  results.     Micky Albee, D.O. DABFM, DABOM [facility] [address] [place], [place] [address] [phone]

## 2023-10-17 NOTE — Progress Notes (Unsigned)
 [facility] Return Visit   Referring Provider Lavada Porteous, DO [address] [place],  [place] [address]  Primary Care Provider Lavada Porteous, DO  Patient Profile: Morgan [name] is a 38 y.o. female with a past medical history noteworthy for obesity status post laparoscopic sleeve gastrectomy, vitamin D  deficiency, carpal tunnel syndrome who returns to the [facility] at the request of Dr. Telford Feather for follow-up of the problem(s) listed below: Problem List: Iron deficiency anemia GERD H. Pylori infection x 3; most recently diagnosed by biopsy 07/2023  History of IBS History of hepatitis B   History of Present Illness   Morgan [name] was last seen in the GI office 07/16/2023   Current GI Meds  Pantoprazole  40 mg p.o. twice daily Baclofen  10 mg p.o. nightly  Interval History   GERD/H pylori gastritis -- At last visit reported longstanding history of GERD dating back to teenage years -- GERD has been managed over the years with PPIs and famotidine -- In 01/2021 she underwent bariatric surgery with sleeve gastrectomy in [place] -reflux worsened after her bariatric surgery  -- Reported worsening GERD status post pneumonia in fall 2024 -- Increased pantoprazole  to 80 mg p.o. twice daily of her own volition just prior to her last visit -- At last visit I prescribed baclofen  10 mg p.o. nightly which she has found very helpful  -- Reports being diagnosed with H. pylori infection twice in the past -- First treatment regimen -contained metronidazole and she developed symptoms of hypotension and syncope -- Second treatment regimen consisted of amoxicillin  and clarithromycin  -- EGD 08/19/2023 -endoscopically normal, small HH -Path showed chronic active H. pylori associated gastritis and chronic peptic duodenitis -- Tx'd with rifabutin , amoxicillin , pantoprazole   -- States she is approximately halfway through her H. pylori treatment with rifabutin ,  amoxicillin  and pantoprazole -delay in treatment due to Ramadan -- Notes that she was starting to feel better around the time of her EGD with the addition of baclofen  -- She had returned to taking pantoprazole  40 mg p.o. twice daily -- Notes worsening symptoms of heartburn and abdominal pain on amoxicillin  and rifabutin -she is hoping this will improve after completing treatment -- In addition to H. pylori antibiotics she is taking pantoprazole  80 mg p.o. every morning, 40 mg p.o. afternoon and 40 mg p.o. every afternoon -- Discussed coordinating a test of cure 4 to 8 weeks after treatment -- Her mother and sister were diagnosed with H. pylori infection many years ago but this resolved after 1 course of treatment  IDA -- Morgan [name] also has a longstanding history of anemia that she states dates back to her adolescence/early 44s -- Denies melena, hematochezia, hematemesis -- No menorrhagia-has a copper IUD  -- Labs 09/06/2023 -improved hemoglobin and iron studies -- WBC 3.5, Hgb 13.2, HCT 40.7, platelets 256 -- Iron 37, TIBC 294, ferritin133  -- Has received iron infusions   -- Colonoscopy 08/19/2023 - normal colon and TI - repeat age 66 years -- CTAP 07/2023 - normal  -- Noteworthy that she has had mild leukopenia as well and states that her hematologist queries if there may be a cellular/bone marrow component to her anemia and intermittent leukopenia  HBV -- Records indicate that Sherlin tested positive for hepatitis B in the past -- She relates that 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 pylori associated gastritis and chronic peptic duodenitis  Last Abd CT/CTE/MRE: CTAP 07/2023 - normal  GI Review of Symptoms Significant for GERD. Otherwise negative.  General Review of Systems  Review of systems is significant for the pertinent positives and negatives as listed per the HPI.  Full ROS is otherwise negative.  Past Medical History   Past Medical History:  Diagnosis Date   Anemia    Back pain    Complication of anesthesia    Patient states when given the epidural the numbness went up to her nose.    Edema    GERD (gastroesophageal reflux disease)    H. pylori infection 07/2023   treated with rifabutin , amoxacillin, PPI   Heartburn    Hepatitis    Hepatitis B carrier (HCC)    IBS (irritable bowel syndrome)    Obesity    Osteoarthritis    Palpitations    Pneumonia    Vitamin D  deficiency      Past Surgical History   Past Surgical History:  Procedure Laterality Date   CARPAL TUNNEL RELEASE     gastric balloon  01/28/2021   ocular muscle corrective surgery     SLEEVE GASTROPLASTY  2022     Allergies and Medications   Allergies  Allergen Reactions   Metronidazole Other (See Comments)    Severe hypotension    Current Meds  Medication Sig   acetaminophen  (TYLENOL ) 500 MG tablet Take 500-1,000 mg by mouth every 6 (six) hours as needed for moderate pain (pain score 4-6).   amoxicillin  (AMOXIL ) 500 MG capsule Take 1 capsule (500 mg total) by mouth every 8 (eight) hours.   amoxicillin  (AMOXIL ) 500 MG tablet Take 500 mg by mouth every 8 (eight) hours.   buPROPion  (WELLBUTRIN  XL) 300 MG 24 hr tablet Take 1 tablet (300 mg total) by mouth daily.   cetirizine  (ZYRTEC ) 10 MG tablet Take 1 tablet (10 mg total) by mouth daily.   chlorhexidine  (PERIDEX ) 0.12 % solution Swish for one minute then expectorate--use twice daily for 10 days.   ferrous gluconate  (FERGON) 324 MG tablet Take 1 tablet (324 mg total) by mouth  daily with breakfast.   fluticasone  (FLONASE ) 50 MCG/ACT nasal spray Place 2 sprays into both nostrils daily.   ibuprofen  (ADVIL ) 800 MG tablet Take 800 mg by mouth every 8 (eight) hours as needed.   ibuprofen  (ADVIL ) 800 MG tablet Take 1 tablet every 8 hours as needed for pain   Multiple Vitamin (MULTIVITAMIN) tablet Take 1 tablet by mouth daily.   naproxen  sodium (ALEVE ) 220 MG tablet Take 440 mg by mouth 2 (two) times daily as needed (Pain).   ondansetron  (ZOFRAN ) 4 MG tablet Take 1 tablet (4 mg total) by mouth every 8 (eight) hours as needed for nausea or vomiting.   pantoprazole  (PROTONIX ) 40 MG tablet Take 1 tablet (40 mg total) by mouth 2 (two) times daily.   Phentermine  HCl (LOMAIRA ) 8 MG TABS Take 1 tablet (8 mg total) by mouth daily 30 minutes before breakfast.   RIFABUTIN  PO Take 500 mg by mouth in the morning, at noon, and at bedtime.   TWIRLA 120-30 MCG/24HR PTWK Apply 1 patch topically  once a week.   [DISCONTINUED] baclofen  (LIORESAL ) 10 MG tablet Take 1 tablet (10 mg total) by mouth at bedtime.     Family History   Family History  Problem Relation Age of Onset   Hypertension Mother    Irritable bowel syndrome Mother    Hyperthyroidism Mother    Asthma Mother    Hypertension Father    Hyperlipidemia Father    Irritable bowel syndrome Father    Kidney disease Sister    Hyperthyroidism Sister    Asthma Sister    Asthma Maternal Grandmother    Asthma Son    Alcohol abuse Neg Hx    Arthritis Neg Hx    Birth defects Neg Hx    Cancer Neg Hx    COPD Neg Hx    Depression Neg Hx    Diabetes Neg Hx    Drug abuse Neg Hx    Early death Neg Hx    Hearing loss Neg Hx    Heart disease Neg Hx    Learning disabilities Neg Hx    Mental illness Neg Hx    Mental retardation Neg Hx    Miscarriages / Stillbirths Neg Hx    Stroke Neg Hx    Vision loss Neg Hx    Rectal cancer Neg Hx      Social History   Social History   Tobacco Use   Smoking status: Never    Passive  exposure: Never   Smokeless tobacco: Never  Vaping Use   Vaping status: Never Used  Substance Use Topics   Alcohol use: Never   Drug use: Never   Morgan [name] reports that she has never smoked. She has never been exposed to tobacco smoke. She has never used smokeless tobacco. She reports that she does not drink alcohol and does not use drugs.  Vital Signs and Physical Examination   Vitals:   10/19/23 0838  BP: 100/80  Pulse: (!) 51  Height: 5' (1.524 m)  Weight: 135 lb 6 oz (61.4 kg)  SpO2: 98%  BMI (Calculated): 26.44     General: Well developed, well nourished, no acute distress Head: Normocephalic and atraumatic Eyes: Sclerae anicteric, EOMI Abdomen: Soft, mild tenderness to palpation in the LUQ, non distended. No masses, hepatosplenomegaly or hernias noted. Normal Bowel sounds Rectal: Deferred Musculoskeletal: Symmetrical with no gross deformities   Review of Data  The following data was reviewed at the time of this encounter:  Laboratory Studies      Latest Ref Rng & Units 09/06/2023   10:35 AM 07/14/2023    9:00 AM 06/29/2023    4:30 PM  CBC  WBC 4.0 - 10.5 K/uL 3.5  3.8  8.2   Hemoglobin 12.0 - 15.0 g/dL 16.1  09.6  04.5   Hematocrit 36.0 - 46.0 % 40.7  34.4  33.8   Platelets 150 - 400 K/uL 256  298  463     Lab Results  Component Value Date   LIPASE 23 06/29/2023      Latest Ref Rng & Units 06/29/2023    4:30 PM 04/21/2023   10:40 AM 10/07/2022    8:11 AM  CMP  Glucose 70 - 99 mg/dL 94  73  80   BUN 6 - 20 mg/dL 11  9  13    Creatinine 0.44 - 1.00 mg/dL 4.09  8.11  9.14   Sodium 135 - 145 mmol/L 138  140  137   Potassium 3.5 - 5.1 mmol/L 4.0  4.5  4.6   Chloride 98 - 111 mmol/L 102  103  100   CO2 22 - 32 mmol/L 24  23  19    Calcium 8.9 - 10.3 mg/dL 9.3  9.5  9.6   Total Protein 6.5 - 8.1 g/dL 7.2  6.5  7.3   Total Bilirubin 0.0 - 1.2 mg/dL 1.5  0.3  0.3   Alkaline Phos 38 - 126 U/L 94  61  69   AST 15 - 41 U/L 15  10  13    ALT 0 - 44 U/L 13  11  12      Lab Results  Component Value Date   IRON 37 09/06/2023   TIBC 294 09/06/2023   FERRITIN 133 09/06/2023     Imaging Studies  CTAP 07/30/2023 No acute abdominal or pelvic pathology.  No adenopathy.    CT angio chest 06/30/2023 1. Multifocal pneumonia most prominent along the right upper lobe. 2. Question soft tissue density of the mediastinum that could represent conglomerative lymphadenopathy with limited evaluation due to timing of contrast. Recommend attention on follow-up. 3. Splenomegaly. 4. Question upper abdominal lymphadenopathy. Recommend CT abdomen pelvis with intravenous contrast for further evaluation.   GI Procedures and Studies  EGD/Colonoscopy 07/2023 EGD -gastric sleeve, endoscopically normal, H&H Colonoscopy - normal colon and TI Path: H. pylori chronic gastritis and peptic duodenitis  Patient reports having an EGD in 2022 that showed "minor erythema"  Clinical Impression  It is my clinical impression that Morgan [name] is a 38 y.o. female with;  Iron deficiency anemia GERD H.pylori infection x 3; most recently diagnosed by biopsy to 2025 History of IBS History of hepatitis B   Morgan [name] returns to the office today for follow-up of GERD and H. pylori infection.  She was initially seen in the office 06/2023 endorsing worsening symptoms of GERD and epigastric abdominal pain.  She had increased her pantoprazole  from 40 mg p.o. twice daily to 80 mg p.o. twice daily.  In response to her symptoms I added baclofen  10 mg p.o. nightly and scheduled her for an upper endoscopy which yielded evidence of H. pylori gastritis.  Morgan [name]  reports that she has been diagnosed with H. pylori 3 times.  The first course of treatment consisted of metronidazole to which she had a reaction involving syncope and hypotension.  Her second course of treatment included amoxicillin  and clarithromycin.  She is now on her third course of treatment consisting of amoxicillin  and rifabutin .  She  states that her symptoms had been improving with the addition of baclofen  and she has noted exacerbation since embarking on her antibiotics.  We discussed completing her course of antibiotics and performing a test of cure 4 to 8 weeks after completing treatment..  Once she has completed H. pylori treatment she can resume pantoprazole  40 mg p.o. twice daily in conjunction with baclofen .  Reviewed that there is further dose optimization of baclofen  that can occur up to a maximum dose of 20 mg p.o. 3 times daily.  In terms of her iron deficiency anemia, EGD disclosed evidence of H. pylori gastritis which could be contributing to anemia.  Duodenal biopsies were negative for celiac disease -only had evidence of peptic duodenitis.  No findings on colonoscopy to account for anemia.  CTAP was also reassuring.  Her iron parameters and hemoglobin are improved.  She is continuing on ferrous gluconate  and is being followed by hematology.  She notes that she has also had intermittent mild leukopenia and her hematologist is queried  if there may be an issue with her cell lines given both leukopenia and anemia.  Morgan [name] has a previous history of hepatitis B infection possibly related to a needlestick in the past.  Serologies performed in 2021 showed that she has hepatitis B surface antigen negative, surface antigen and core antibody positive.  Her laboratory parameters are indicative of resolved infection without ongoing hepatitis B..   Plan  Complete course of H. pylori treatment with rifabutin , amoxicillin  and PPI Diatherix stool kit provided today to confirm H. pylori eradication 4 to 8 weeks after completing treatment After completing H. pylori treatment may resume pantoprazole  40 mg p.o. twice daily Continue baclofen  10 mg p.o. nightly -this can be further dose optimized if needed. Ongoing anemia monitoring in conjunction with hematology oncology HBV  Planned Follow Up 4 months  The patient or caregiver  verbalized understanding of the material covered, with no barriers to understanding. All questions were answered. Patient or caregiver is agreeable with the plan outlined above.    It was a pleasure to see Morgan [name].  If you have any questions or concerns regarding this evaluation, do not hesitate to contact me.  Eugenia Hess, MD [facility]

## 2023-10-18 ENCOUNTER — Encounter: Payer: Self-pay | Admitting: Pediatrics

## 2023-10-19 ENCOUNTER — Encounter: Payer: Self-pay | Admitting: Pediatrics

## 2023-10-19 ENCOUNTER — Ambulatory Visit: Payer: Medicaid Other | Admitting: Pediatrics

## 2023-10-19 VITALS — BP 100/80 | HR 51 | Ht 60.0 in | Wt 135.4 lb

## 2023-10-19 DIAGNOSIS — Z8619 Personal history of other infectious and parasitic diseases: Secondary | ICD-10-CM

## 2023-10-19 DIAGNOSIS — B1910 Unspecified viral hepatitis B without hepatic coma: Secondary | ICD-10-CM

## 2023-10-19 DIAGNOSIS — D509 Iron deficiency anemia, unspecified: Secondary | ICD-10-CM

## 2023-10-19 DIAGNOSIS — K219 Gastro-esophageal reflux disease without esophagitis: Secondary | ICD-10-CM | POA: Diagnosis not present

## 2023-10-19 DIAGNOSIS — Z8719 Personal history of other diseases of the digestive system: Secondary | ICD-10-CM | POA: Diagnosis not present

## 2023-10-19 DIAGNOSIS — A048 Other specified bacterial intestinal infections: Secondary | ICD-10-CM

## 2023-10-19 DIAGNOSIS — B9681 Helicobacter pylori [H. pylori] as the cause of diseases classified elsewhere: Secondary | ICD-10-CM | POA: Diagnosis not present

## 2023-10-19 MED ORDER — BACLOFEN 10 MG PO TABS: 10.0000 mg | ORAL_TABLET | Freq: Every evening | ORAL | 3 refills | Status: AC

## 2023-10-19 NOTE — Patient Instructions (Signed)
 Your provider has ordered "Diatherix" stool testing for you. You have received a kit from our office today containing all necessary supplies to complete this test. Please carefully read the stool collection instructions provided in the kit before opening the accompanying materials. In addition, be sure there is a label providing your full name and date of birth on the "puritan opti-swab" tube that is supplied in the kit (if you do not see a label with this information on your test tube, please make us  aware before test collection!). After completing the test, you should secure the purtian tube into the specimen biohazard bag. The [facility] E-Req sheet (including date and time of specimen collection) should be placed into the outside pocket of the specimen biohazard bag and returned to the [place] [facility] lab (basement floor of [facility]) within 3 days of collection. Please make sure to give the specimen to a staff member at the lab. DO NOT leave the specimen on the counter.   If the specimen date and time (can be found in the upper right boxed portion of the sheet) are not filled out on the E-Req sheet, the test will NOT be performed.    We have sent the following medications to your pharmacy for you to pick up at your convenience: Baclofen   Thank you for entrusting me with your care and for choosing [facility], Dr. Eugenia Hess   _______________________________________________________  If your blood pressure at your visit was 140/90 or greater, please contact your primary care physician to follow up on this.  _______________________________________________________  If you are age 39 or older, your body mass index should be between 23-30. Your Body mass index is 26.44 kg/m. If this is out of the aforementioned range listed, please consider follow up with your Primary Care Provider.  If you are age 27 or younger, your body mass index should be between 19-25.  Your Body mass index is 26.44 kg/m. If this is out of the aformentioned range listed, please consider follow up with your Primary Care Provider.   ________________________________________________________  The [facility] providers would like to encourage you to use MYCHART to communicate with providers for non-urgent requests or questions.  Due to long hold times on the telephone, sending your provider a message by [facility] may be a faster and more efficient way to get a response.  Please allow 48 business hours for a response.  Please remember that this is for non-urgent requests.  _______________________________________________________

## 2023-10-22 ENCOUNTER — Encounter: Payer: Self-pay | Admitting: Family Medicine

## 2023-10-25 ENCOUNTER — Ambulatory Visit
Admission: RE | Admit: 2023-10-25 | Discharge: 2023-10-25 | Disposition: A | Discharge status: Home / Self Care | Source: Ambulatory Visit | Attending: Family Medicine | Admitting: Family Medicine

## 2023-10-25 DIAGNOSIS — M7989 Other specified soft tissue disorders: Secondary | ICD-10-CM | POA: Diagnosis not present

## 2023-10-25 DIAGNOSIS — M25571 Pain in right ankle and joints of right foot: Secondary | ICD-10-CM | POA: Diagnosis not present

## 2023-10-25 DIAGNOSIS — M19071 Primary osteoarthritis, right ankle and foot: Secondary | ICD-10-CM | POA: Diagnosis not present

## 2023-10-25 DIAGNOSIS — G8929 Other chronic pain: Secondary | ICD-10-CM

## 2023-11-10 ENCOUNTER — Encounter: Payer: Self-pay | Admitting: Family Medicine

## 2023-11-10 ENCOUNTER — Ambulatory Visit (INDEPENDENT_AMBULATORY_CARE_PROVIDER_SITE_OTHER): Admitting: Family Medicine

## 2023-11-10 VITALS — BP 117/83 | HR 64 | Temp 98.1°F | Ht 60.0 in | Wt 133.0 lb

## 2023-11-10 DIAGNOSIS — R632 Polyphagia: Secondary | ICD-10-CM | POA: Diagnosis not present

## 2023-11-10 DIAGNOSIS — F3289 Other specified depressive episodes: Secondary | ICD-10-CM | POA: Diagnosis not present

## 2023-11-10 DIAGNOSIS — K219 Gastro-esophageal reflux disease without esophagitis: Secondary | ICD-10-CM

## 2023-11-10 DIAGNOSIS — Z9884 Bariatric surgery status: Secondary | ICD-10-CM

## 2023-11-10 DIAGNOSIS — F5089 Other specified eating disorder: Secondary | ICD-10-CM | POA: Diagnosis not present

## 2023-11-10 DIAGNOSIS — N926 Irregular menstruation, unspecified: Secondary | ICD-10-CM

## 2023-11-10 DIAGNOSIS — E663 Overweight: Secondary | ICD-10-CM

## 2023-11-10 DIAGNOSIS — Z6825 Body mass index (BMI) 25.0-25.9, adult: Secondary | ICD-10-CM | POA: Diagnosis not present

## 2023-11-10 MED ORDER — LOMAIRA 8 MG PO TABS: 8.0000 mg | ORAL_TABLET | Freq: Every day | ORAL | 0 refills | Status: DC

## 2023-11-10 MED ORDER — BUPROPION HCL ER (XL) 300 MG PO TB24: 300.0000 mg | ORAL_TABLET | Freq: Every day | ORAL | 2 refills | Status: DC

## 2023-11-10 NOTE — Progress Notes (Signed)
 Office: [phone]  /  Fax: [phone]  WEIGHT SUMMARY AND BIOMETRICS  Starting Date: 10/07/22  Starting Weight: 154lb   Weight Lost Since Last Visit: 0lb   Vitals Temp: 98.1 F (36.7 C) BP: 117/83 Pulse Rate: 64 SpO2: 100 %   Body Composition  Body Fat %: 34.8 % Fat Mass (lbs): 46.6 lbs Muscle Mass (lbs): 82.8 lbs Total Body Water (lbs): 67.8 lbs Visceral Fat Rating : 5    HPI  Chief Complaint: OBESITY  Morgan [name] is here to discuss her progress with her obesity treatment plan. She is on the practicing portion control and making smarter food choices, such as increasing vegetables and decreasing simple carbohydrates and states she is following her eating plan approximately 0 % of the time. She states she is exercising 20-30 minutes 4 times per week.  Interval History:  Since last office visit she is up 1 lb  She is up 6 lb of muscle mass and down 4.4 lb of body fat in the past month This gives her a net weight loss of 21 lb in 13 mos She is happy at her weight, currently in maintenance phase post VSG done 2022 in [place] with a peak weight of 240 lb She had to eat out more due to a recent move into a new house She plans on cooking at home  She plans to add in more walking in her new neighborhood She has some sugar cravings around her menstrual cycle (q 2 weeks)   Energy levels remain low with IDA and recent treatment for H Pylori  Pharmacotherapy: Lomaira  8 mg tab qAM  PHYSICAL EXAM:  Blood pressure 117/83, pulse 64, temperature 98.1 F (36.7 C), height 5' (1.524 m), weight 133 lb (60.3 kg), last menstrual period 09/24/2023, SpO2 100%, unknown if currently breastfeeding. Body mass index is 25.97 kg/m.  General: She is healthy appearing cooperative, alert, well developed, and in no acute distress. PSYCH: Has normal mood, affect and thought process.   Lungs: Normal breathing effort, no conversational dyspnea.   ASSESSMENT AND PLAN  TREATMENT PLAN FOR  OBESITY:  Recommended Dietary Goals  [name] is currently in the action stage of change. As such, her goal is to continue weight management plan. She has agreed to practicing portion control and making smarter food choices, such as increasing vegetables and decreasing simple carbohydrates. ~1300 cal/ day for maintenance  Behavioral Intervention  We discussed the following Behavioral Modification Strategies today: increasing lean protein intake to established goals, increasing fiber rich foods, increasing water intake , work on meal planning and preparation, decreasing eating out or consumption of processed foods, and making healthy choices when eating convenient foods, practice mindfulness eating and understand the difference between hunger signals and cravings, work on managing stress, creating time for self-care and relaxation, avoiding temptations and identifying enticing environmental cues, continue to practice mindfulness when eating, planning for success, and continue to work on maintaining a reduced calorie state, getting the recommended amount of protein, incorporating whole foods, making healthy choices, staying well hydrated and practicing mindfulness when eating..  Additional resources provided today: NA  Recommended Physical Activity Goals  [name] has been advised to work up to 150 minutes of moderate intensity aerobic activity a week and strengthening exercises 2-3 times per week for cardiovascular health, weight loss maintenance and preservation of muscle mass.   She has agreed to Think about enjoyable ways to increase daily physical activity and overcoming barriers to exercise and Increase physical activity in their day  and reduce sedentary time (increase NEAT).  Pharmacotherapy changes for the treatment of obesity: none  ASSOCIATED CONDITIONS ADDRESSED TODAY  Polyphagia Improving on Lomaira  8 mg daily with a normal BP/HR She plans to cook at home more now what she has moved and  remains mindful of protein and fiber intake Continue maintenance phase of weight loss -     Lomaira ; Take 1 tablet (8 mg total) by mouth daily 30 minutes before breakfast.  Dispense: 30 tablet; Refill: 0  Other depression with emotional eating Improved on Wellbutrin  XL 300 mg daily without adverse SE -     buPROPion  HCl ER (XL); Take 1 tablet (300 mg total) by mouth daily.  Dispense: 30 tablet; Refill: 2  Overweight (BMI 25.0-29.9)  S/P laparoscopic sleeve gastrectomy She is happy at her current weight and plans to add in more regular exercise as her energy level improves.  Continue a bariatric MVI daily  Gastroesophageal reflux disease, unspecified whether esophagitis present Well controlled on Pantoprazole  40 mg bid  Met with Dr Yvone Herd 4/29, notes reviewed Awaiting follow up testing following H Pylori treatment Denies epigastric pain, nausea or vomiting  Irregular menstruation Has follow up with OB to discuss menses occurring q 2 weeks Had IUD removed as menses were worse Has associated menstrual food cravings and worsening IDA due to bleeding   She was informed of the importance of frequent follow up visits to maximize her success with intensive lifestyle modifications for her multiple health conditions.   ATTESTASTION STATEMENTS:  Reviewed by clinician on day of visit: allergies, medications, problem list, medical history, surgical history, family history, social history, and previous encounter notes pertinent to obesity diagnosis.   I have personally spent 30 minutes total time today in preparation, patient care, nutritional counseling and education,  and documentation for this visit, including the following: review of most recent clinical lab tests, prescribing medications/ refilling medications, reviewing medical assistant documentation, review and interpretation of bioimpedence results.     Micky Albee, D.O. DABFM, DABOM [facility] [address] [place], [place] [address] [phone]

## 2023-11-11 ENCOUNTER — Ambulatory Visit: Admitting: Family Medicine

## 2023-11-17 ENCOUNTER — Telehealth (INDEPENDENT_AMBULATORY_CARE_PROVIDER_SITE_OTHER): Admitting: Family Medicine

## 2023-11-17 DIAGNOSIS — M25571 Pain in right ankle and joints of right foot: Secondary | ICD-10-CM

## 2023-11-17 DIAGNOSIS — G8929 Other chronic pain: Secondary | ICD-10-CM

## 2023-11-17 NOTE — Progress Notes (Signed)
 MRI reviewed and discussed with patient.  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  Do not think at this point evaluation for microfracture or OATS procedure would be beneficial given that progression.  Encouraged home exercise program with ankle rehab including theraband - will send this to her.  Tylenol , topical medications, nsaids if needed discussed.  Consider formal physical therapy.  Consider injection if pain is severe.  Follow up in 1 month or as needed if she's doing well.

## 2023-12-07 ENCOUNTER — Inpatient Hospital Stay: Attending: Oncology

## 2023-12-07 ENCOUNTER — Encounter: Payer: Self-pay | Admitting: Oncology

## 2023-12-07 ENCOUNTER — Inpatient Hospital Stay: Admitting: Oncology

## 2023-12-07 VITALS — BP 105/71 | HR 58 | Temp 98.1°F | Resp 17 | Ht 60.0 in | Wt 139.2 lb

## 2023-12-07 DIAGNOSIS — D72819 Decreased white blood cell count, unspecified: Secondary | ICD-10-CM | POA: Diagnosis not present

## 2023-12-07 DIAGNOSIS — D509 Iron deficiency anemia, unspecified: Secondary | ICD-10-CM | POA: Diagnosis not present

## 2023-12-07 LAB — CBC WITH DIFFERENTIAL (CANCER CENTER ONLY)
Abs Immature Granulocytes: 0 K/uL (ref 0.00–0.07)
Basophils Absolute: 0 K/uL (ref 0.0–0.1)
Basophils Relative: 1 %
Eosinophils Absolute: 0.1 K/uL (ref 0.0–0.5)
Eosinophils Relative: 4 %
HCT: 38.2 % (ref 36.0–46.0)
Hemoglobin: 12.2 g/dL (ref 12.0–15.0)
Immature Granulocytes: 0 %
Lymphocytes Relative: 53 %
Lymphs Abs: 1.6 K/uL (ref 0.7–4.0)
MCH: 25.8 pg — ABNORMAL LOW (ref 26.0–34.0)
MCHC: 31.9 g/dL (ref 30.0–36.0)
MCV: 80.8 fL (ref 80.0–100.0)
Monocytes Absolute: 0.2 K/uL (ref 0.1–1.0)
Monocytes Relative: 7 %
Neutro Abs: 1 K/uL — ABNORMAL LOW (ref 1.7–7.7)
Neutrophils Relative %: 35 %
Platelet Count: 238 K/uL (ref 150–400)
RBC: 4.73 MIL/uL (ref 3.87–5.11)
RDW: 13.2 % (ref 11.5–15.5)
WBC Count: 3 K/uL — ABNORMAL LOW (ref 4.0–10.5)
nRBC: 0 % (ref 0.0–0.2)

## 2023-12-07 LAB — IRON AND TIBC
Iron: 91 ug/dL (ref 28–170)
Saturation Ratios: 24 % (ref 10.4–31.8)
TIBC: 385 ug/dL (ref 250–450)
UIBC: 294 ug/dL

## 2023-12-07 LAB — FERRITIN: Ferritin: 131 ng/mL (ref 11–307)

## 2023-12-07 NOTE — Progress Notes (Signed)
 [facility]  HEMATOLOGY CLINIC PROGRESS NOTE  PATIENT NAME: Morgan [name]   MR#: [id number] DOB: 23-Nov-1985  Patient Care Team: Lavada Porteous, DO as PCP - General (Family Medicine) Thukkani, Arun K, MD as PCP - Cardiology (Cardiology)  Date of visit: 12/07/2023   ASSESSMENT & PLAN:   Morgan [name] is a 38 y.o. lady with a past medical history of GERD, irritable bowel syndrome, s/p laparoscopic sleeve gastrectomy in 2022, arthritis, was referred to our service in December 2024 for evaluation of anemia.    Iron deficiency anemia Persistent despite long-term oral iron supplementation. Symptoms included fatigue, shortness of breath, palpitations, and chest tightness.   On her initial consultation with us  on 06/01/2023, labs showed hemoglobin of 11.1, hematocrit 37, MCV 76.3.  White count 3800 with normal differential.  Platelet count normal at 273,000.  Iron studies continued to show evidence of iron deficiency with ferritin of 9, iron saturation of 5%, iron decreased at 23.  B12, folate, LDH, haptoglobin were within normal limits.  Coombs test negative.   Given persistent iron deficiency anemia, we proceeded with IV iron using Feraheme  weekly x 2 doses.  Labs today revealed stable hemoglobin of 12.2, MCV 80.8.  White count overall stable at 3000 with normal differential, ANC 1000.  Platelet count normal.  Iron studies showed no evidence of iron deficiency.  No indication for IV iron currently.   Patient apparently had testing for sickle cell disease, thalassemia in the past when she was living abroad (in [place]) and workup was negative per her verbal report.  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  -RTC in 4 months for repeat labs and follow-up.  Leukopenia Chronic low white blood cell count.  Apparently she had hematological workup overseas previously.  No known cause identified yet.   On her initial consultation with us  on 06/01/2023, white count was  better at 3800 with normal differential.  B12, folate were within normal limits.  ANA negative.  White count is stable at 3000 today.  On recent ER visit in January 2025, white count was normal at 8200, indicating adequate bone marrow response to stressful situations.  Picture consistent with benign neutropenia/leukopenia.   She is asymptomatic from leukopenia.  We can continue monitoring at current levels.  Assessment and Plan Assessment & Plan Leukopenia White blood cell count is 3,000, lower than the previous count but within her typical range. Differential is normal with neutrophils at 1,000. White count fluctuates, increasing during stress. Awaiting iron study results to determine the need for further iron supplementation. Currently taking Fergon and tolerating it well. - Await results of iron studies - Consider repeating iron supplementation if iron levels are very low    I spent a total of  20 minutes during this encounter with the patient including review of chart and various tests results, discussions about plan of care and coordination of care plan.  I reviewed lab results and outside records for this visit and discussed relevant results with the patient. Diagnosis, plan of care and treatment options were also discussed in detail with the patient. Opportunity provided to ask questions and answers provided to her apparent satisfaction. Provided instructions to call our clinic with any problems, questions or concerns prior to return visit. I recommended to continue follow-up with PCP and sub-specialists. She verbalized understanding and agreed with the plan. No barriers to learning was detected.  Arlo Berber, MD  12/07/2023 6:03 PM  [facility]  [facility] - A DEPT OF [facility] [address] [place] [address] Dept: [phone] Dept Fax: [phone]   CHIEF COMPLAINT/ REASON FOR VISIT:  Follow-up for iron  deficiency anemia and chronic, mild leukopenia.  INTERVAL HISTORY:  Discussed the use of AI scribe software for clinical note transcription with the patient, who gave verbal consent to proceed.  History of Present Illness Morgan [name] is a 38 year old female with GERD and iron deficiency anemia who presents for follow-up of fatigue and anemia.  She experiences fatigue, particularly in the midday, which has improved but still necessitates rest. She completed a course of H. pylori treatment and continues on Protonix  40 mg twice daily. Baclofen  has been added to her regimen, providing some relief from GERD symptoms.  No fevers, chills, night sweats, or recurrent infections. She experiences some shortness of breath but denies chest pain or trouble breathing.  Her hemoglobin level is 12.2, hematocrit is 38.2, and MCV is 80.8. Her white blood cell count is 3,000, with a previous count of 8,000 during a stressful situation. She is currently taking Fergon and tolerating it well, continuing daily.  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   SUMMARY OF HEMATOLOGIC HISTORY:  Labs at her PCPs office on 04/21/2023 showed hemoglobin of 10.8, hematocrit 36.7, MCV 74.  White count was 2900.  Platelet count was normal at 227,000.  Iron saturation was decreased at 7%, ferritin borderline low at 16.  Previously labs in April 2024 showed hemoglobin of 10.9, MCV 70.  White count was 3600 at that time.  Given persistent anemia, referral was sent to us  for further evaluation.   She presents with persistent iron deficiency despite long-term oral iron supplementation. The patient reports experiencing fatigue, shortness of breath, palpitations, and occasional chest tightness. The patient also mentions having carpal tunnel surgery and experiencing generalized fatigue, myalgia, and bone pain. Despite these symptoms, the patient's hemoglobin levels remain around ten. The  patient also reports a history of low white blood cell count. The patient has been on pantoprazole  for over two years due to GERD and cannot tolerate NSAIDs due to severe gastritis. The patient also reports having heavy menstrual cycles.   She had not noticed any recent bleeding such as epistaxis, hematuria or hematochezia.  On her initial consultation with us  on 06/01/2023, labs showed hemoglobin of 11.1, hematocrit 37, MCV 76.3.  White count 3800 with normal differential.  Platelet count normal at 273,000.  Iron studies continued to show evidence of iron deficiency with ferritin of 9, iron saturation of 5%, iron decreased at 23.  B12, folate, LDH, haptoglobin were within normal limits.  Coombs test negative.   Given persistent iron deficiency anemia, we proceeded with IV iron using Feraheme  weekly x 2 doses.   Patient apparently had testing for sickle cell disease, thalassemia in the past when she was living abroad (in [place]) and workup was negative per her verbal report.  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   Chronic low white blood cell count.  Apparently she had hematological workup overseas previously.  No known cause identified yet.   On her initial consultation with us  on 06/01/2023, white count was better at 3800 with normal differential.  B12, folate were within normal limits.  ANA negative.   She is asymptomatic from leukopenia.  We can continue monitoring at current levels.  I have reviewed the past medical history, past surgical  history, social history and family history with the patient and they are unchanged from previous note.  ALLERGIES: She is allergic to metronidazole.  MEDICATIONS:  Current Outpatient Medications  Medication Sig Dispense Refill   acetaminophen  (TYLENOL ) 500 MG tablet Take 500-1,000 mg by mouth every 6 (six) hours as needed for moderate pain (pain score 4-6).     baclofen  (LIORESAL ) 10 MG tablet Take 1 tablet (10 mg total) by mouth at bedtime. 90  tablet 3   buPROPion  (WELLBUTRIN  XL) 300 MG 24 hr tablet Take 1 tablet (300 mg total) by mouth daily. 30 tablet 2   cetirizine  (ZYRTEC ) 10 MG tablet Take 1 tablet (10 mg total) by mouth daily. 30 tablet 11   ferrous gluconate  (FERGON) 324 MG tablet Take 1 tablet (324 mg total) by mouth daily with breakfast. 30 tablet 1   fluticasone  (FLONASE ) 50 MCG/ACT nasal spray Place 2 sprays into both nostrils daily. 16 g 6   ibuprofen  (ADVIL ) 800 MG tablet Take 800 mg by mouth every 8 (eight) hours as needed.     ibuprofen  (ADVIL ) 800 MG tablet Take 1 tablet every 8 hours as needed for pain 20 tablet 0   Multiple Vitamin (MULTIVITAMIN) tablet Take 1 tablet by mouth daily.     naproxen  sodium (ALEVE ) 220 MG tablet Take 440 mg by mouth 2 (two) times daily as needed (Pain).     pantoprazole  (PROTONIX ) 40 MG tablet Take 1 tablet (40 mg total) by mouth 2 (two) times daily. 180 tablet 0   Phentermine  HCl (LOMAIRA ) 8 MG TABS Take 1 tablet (8 mg total) by mouth daily 30 minutes before breakfast. 30 tablet 0   TWIRLA 120-30 MCG/24HR PTWK Apply 1 patch topically once a week.     ondansetron  (ZOFRAN ) 4 MG tablet Take 1 tablet (4 mg total) by mouth every 8 (eight) hours as needed for nausea or vomiting. 20 tablet 0   No current facility-administered medications for this visit.     REVIEW OF SYSTEMS:    Review of Systems  Respiratory:  Positive for shortness of breath (with exertion).    All other pertinent systems were reviewed with the patient and are negative.  PHYSICAL EXAMINATION:    Onc Performance Status - 12/07/23 1027       ECOG Perf Status   ECOG Perf Status Ambulatory and capable of all selfcare but unable to carry out any work activities.  Up and about more than 50% of waking hours      KPS SCALE   KPS % SCORE Cares for self, unable to carry on normal activity or to do active work          Vitals:   12/07/23 1027  BP: 105/71  Pulse: (!) 58  Resp: 17  Temp: 98.1 F (36.7 C)  SpO2:  100%   Filed Weights   12/07/23 1027  Weight: 139 lb 3.2 oz (63.1 kg)    Physical Exam Constitutional:      General: She is not in acute distress.    Appearance: Normal appearance.  HENT:     Head: Normocephalic and atraumatic.   Cardiovascular:     Rate and Rhythm: Normal rate and regular rhythm.  Pulmonary:     Effort: Pulmonary effort is normal.  Abdominal:     General: There is no distension.   Neurological:     General: No focal deficit present.     Mental Status: She is alert and oriented to person, place, and time.  Psychiatric:        Mood and Affect: Mood normal.        Behavior: Behavior normal.        Thought Content: Thought content normal.     LABORATORY DATA:   I have reviewed the data as listed.  Results for orders placed or performed in visit on 12/07/23  Ferritin  Result Value Ref Range   Ferritin 131 11 - 307 ng/mL  Iron and TIBC  Result Value Ref Range   Iron 91 28 - 170 ug/dL   TIBC 284 132 - 440 ug/dL   Saturation Ratios 24 10.4 - 31.8 %   UIBC 294 ug/dL  CBC with Differential (Cancer Center Only)  Result Value Ref Range   WBC Count 3.0 (L) 4.0 - 10.5 K/uL   RBC 4.73 3.87 - 5.11 MIL/uL   Hemoglobin 12.2 12.0 - 15.0 g/dL   HCT 10.2 72.5 - 36.6 %   MCV 80.8 80.0 - 100.0 fL   MCH 25.8 (L) 26.0 - 34.0 pg   MCHC 31.9 30.0 - 36.0 g/dL   RDW 44.0 34.7 - 42.5 %   Platelet Count 238 150 - 400 K/uL   nRBC 0.0 0.0 - 0.2 %   Neutrophils Relative % 35 %   Neutro Abs 1.0 (L) 1.7 - 7.7 K/uL   Lymphocytes Relative 53 %   Lymphs Abs 1.6 0.7 - 4.0 K/uL   Monocytes Relative 7 %   Monocytes Absolute 0.2 0.1 - 1.0 K/uL   Eosinophils Relative 4 %   Eosinophils Absolute 0.1 0.0 - 0.5 K/uL   Basophils Relative 1 %   Basophils Absolute 0.0 0.0 - 0.1 K/uL   Immature Granulocytes 0 %   Abs Immature Granulocytes 0.00 0.00 - 0.07 K/uL    RADIOGRAPHIC STUDIES:  No recent pertinent imaging studies available to review.  No orders of the defined types  were placed in this encounter.    Future Appointments  Date Time Provider Department Center  12/13/2023 11:20 AM Bowen, Leeta Puls, DO [facility] None  04/10/2024 10:00 AM [facility] PHLEBOTOMIST [facility] None  04/10/2024 10:30 AM Skylah Delauter, Gale Jude, MD [facility] None     This document was completed utilizing speech recognition software. Grammatical errors, random word insertions, pronoun errors, and incomplete sentences are an occasional consequence of this system due to software limitations, ambient noise, and hardware issues. Any formal questions or concerns about the content, text or information contained within the body of this dictation should be directly addressed to the provider for clarification.

## 2023-12-07 NOTE — Assessment & Plan Note (Addendum)
 Chronic low white blood cell count.  Apparently [place].  No known cause identified yet.   On her initial consultation with us  on 06/01/2023, white count was better at 3800 with normal differential.  B12, folate were within normal limits.  ANA negative.  White count is stable at 3000 today.  On recent ER visit in January 2025, white count was normal at 8200, indicating adequate bone marrow response to stressful situations.  Picture consistent with benign neutropenia/leukopenia.   She is asymptomatic from leukopenia.  We can continue monitoring at current levels.

## 2023-12-07 NOTE — Assessment & Plan Note (Addendum)
 Persistent despite long-term oral iron supplementation. Symptoms included fatigue, shortness of breath, palpitations, and chest tightness.   On her initial consultation with us  on 06/01/2023, labs showed hemoglobin of 11.1, hematocrit 37, MCV 76.3.  White count 3800 with normal differential.  Platelet count normal at 273,000.  Iron studies continued to show evidence of iron deficiency with ferritin of 9, iron saturation of 5%, iron decreased at 23.  B12, folate, LDH, haptoglobin were within normal limits.  Coombs test negative.   Given persistent iron deficiency anemia, we proceeded with IV iron using Feraheme  weekly x 2 doses.  Labs today revealed stable hemoglobin of 12.2, MCV 80.8.  White count overall stable at 3000 with normal differential, ANC 1000.  Platelet count normal.  Iron studies showed no evidence of iron deficiency.  No indication for IV iron currently.   Patient apparently had testing for sickle cell disease, thalassemia in the past when she was living abroad (in [place]) and workup was negative per her verbal report.  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  -RTC in 4 months for repeat labs and follow-up.

## 2023-12-13 ENCOUNTER — Encounter: Payer: Self-pay | Admitting: Family Medicine

## 2023-12-13 ENCOUNTER — Ambulatory Visit (INDEPENDENT_AMBULATORY_CARE_PROVIDER_SITE_OTHER): Admitting: Family Medicine

## 2023-12-13 VITALS — BP 120/81 | HR 67 | Temp 98.0°F | Ht 60.0 in | Wt 136.0 lb

## 2023-12-13 DIAGNOSIS — K909 Intestinal malabsorption, unspecified: Secondary | ICD-10-CM

## 2023-12-13 DIAGNOSIS — R632 Polyphagia: Secondary | ICD-10-CM

## 2023-12-13 DIAGNOSIS — E663 Overweight: Secondary | ICD-10-CM

## 2023-12-13 DIAGNOSIS — Z9884 Bariatric surgery status: Secondary | ICD-10-CM

## 2023-12-13 DIAGNOSIS — K219 Gastro-esophageal reflux disease without esophagitis: Secondary | ICD-10-CM | POA: Diagnosis not present

## 2023-12-13 DIAGNOSIS — Z6826 Body mass index (BMI) 26.0-26.9, adult: Secondary | ICD-10-CM | POA: Diagnosis not present

## 2023-12-13 DIAGNOSIS — D509 Iron deficiency anemia, unspecified: Secondary | ICD-10-CM

## 2023-12-13 MED ORDER — PANTOPRAZOLE SODIUM 40 MG PO TBEC: 40.0000 mg | DELAYED_RELEASE_TABLET | Freq: Two times a day (BID) | ORAL | 0 refills | Status: DC

## 2023-12-13 NOTE — Progress Notes (Signed)
 Office: [phone]  /  Fax: [phone]  WEIGHT SUMMARY AND BIOMETRICS  Starting Date: 10/07/22  Starting Weight: 154lb   Weight Lost Since Last Visit: 0lb   Vitals Temp: 98 F (36.7 C) BP: 120/81 Pulse Rate: 67 SpO2: 100 %   Body Composition  Body Fat %: 40.2 % Fat Mass (lbs): 55 lbs Muscle Mass (lbs): 77.6 lbs Visceral Fat Rating : 7   HPI  Chief Complaint: OBESITY  Morgan [name] is here to discuss her progress with her obesity treatment plan. She is on the following a lower carbohydrate, vegetable and lean protein rich diet plan and states she is following her eating plan approximately 40 % of the time. She states she is exercising 30-60 minutes 1-3 times per week.  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 Body mass index is 26.56 kg/m.  General: She is healthy appearing, cooperative, alert, well developed, and in no acute distress. PSYCH: Has normal mood, affect and thought process.   Lungs: Normal breathing effort, no conversational dyspnea.   ASSESSMENT AND PLAN  TREATMENT PLAN FOR OBESITY:  Recommended Dietary Goals  Morgan [name] is currently in the action stage of change. As such, her goal is to continue weight  management plan. She has agreed to following a lower carbohydrate, vegetable and lean protein rich diet plan.  Behavioral Intervention  We discussed the following Behavioral Modification Strategies today: increasing lean protein intake to established goals, increasing fiber rich foods, avoiding skipping meals, increasing water intake , reading food labels , keeping healthy foods at home, work on managing stress, creating time for self-care and relaxation, avoiding temptations and identifying enticing environmental cues, planning for success, and continue to work on maintaining a reduced calorie state, getting the recommended amount of protein, incorporating whole foods, making healthy choices, staying well hydrated and practicing mindfulness when eating..  Additional resources provided today: NA  Recommended Physical Activity Goals  Morgan [name] has been advised to work up to 150 minutes of moderate intensity aerobic activity a week and strengthening exercises 2-3 times per week for cardiovascular health, weight loss maintenance and preservation of muscle mass.   She has agreed to Increase the intensity, frequency or duration of strengthening exercises  and Increase the intensity, frequency or duration of aerobic exercises    Pharmacotherapy changes for the treatment of obesity: discontinue Lomaira   ASSOCIATED CONDITIONS ADDRESSED TODAY  Intestinal malabsorption, unspecified type She has been taking a regular multivitamin daily, OTC vitamin D  2000 IU once daily and prescription ferrous gluconate  daily.  Her energy level remains still fairly low.  Her last B12 was low normal at 305, not on oral or injectable B12.  S/p Feraheme  infusion x 2 with improved iron levels on labs reviewed from 6/17.    Recheck labs today. Recommend: discontinue ferrous gluconate .   Swap out a regular MVI for a chewable bariatric MVI daily (reviewed brands on [employer] with patient).   -     VITAMIN D  25 Hydroxy (Vit-D  Deficiency, Fractures) -     Vitamin B12 -     Prealbumin  Polyphagia Unchanged with Lomaira  8 mg daily.  With lack of body fat loss this past month, will discontinue Lomaira .  She has room for improvement with dietary logging and weight training but overall, her BMI is in a healthy range and will focus on maintaining her current body weight.  Track to ensure adequate protein intake ~75 g/ day and continue to improve intake of fiber with fruits and veggies  Gastroesophageal reflux disease, unspecified whether esophagitis present Well controlled on Pantoprazole  40 mg bid  Awaiting results from her recent H Pylori treatment EGD reviewed from 08/19/2023 Follow up with Dr Suzann following H Pylori results -     Pantoprazole  Sodium; Take 1 tablet (40 mg total) by mouth 2 (two) times daily.  Dispense: 180 tablet; Refill: 0  Overweight (BMI 25.0-29.9)  BMI 26.0-26.9,adult  S/P laparoscopic sleeve gastrectomy Overall, doing well and will work on maintaining her current body weight Reviewed supplement intake and will update labs today Continue to work on eating on a schedule, lean protein with meals and snacks and keeping sugar to a minimum.  Great job adding in more regular exercise  Iron deficiency anemia, unspecified iron deficiency anemia type Reviewed hematology notes from Dr Autumn 12/07/23 Iron levels much improved Recommend d/c ferrous gluconate  RX and changing to a Bariatric MVI with iron daily F/u with OB re: frequent menses q 2 weeks on OCP patch    She was informed of the importance of frequent follow up visits to maximize her success with intensive lifestyle modifications for her multiple health conditions.   ATTESTASTION STATEMENTS:  Reviewed by clinician on day of visit: allergies, medications, problem list, medical history, surgical history, family history, social history, and previous encounter notes pertinent to obesity diagnosis.   I have personally spent 39 minutes  total time today in preparation, patient care, nutritional counseling and education,  and documentation for this visit, including the following: review of most recent clinical lab tests, prescribing medications/ refilling medications, reviewing medical assistant documentation, review and interpretation of bioimpedence results.     Morgan [name], D.O. DABFM, DABOM [facility] [address] [place], [place] [address] [phone]

## 2023-12-13 NOTE — Patient Instructions (Addendum)
 Labs today Will send results to Mychart  You CAN change your multivitamin to a chewable bariatric multitvitamin (like Bariatric Pal on [employer]) Once you start these, you can STOP ferrous gluconate   Stay active with 30+ min of exercise 4-5 days/ wk  Stay OFF of Lomaira   F/u with GI for GERD

## 2023-12-14 LAB — VITAMIN D 25 HYDROXY (VIT D DEFICIENCY, FRACTURES): Vit D, 25-Hydroxy: 38.8 ng/mL (ref 30.0–100.0)

## 2023-12-14 LAB — PREALBUMIN: PREALBUMIN: 29 mg/dL (ref 14–35)

## 2023-12-14 LAB — VITAMIN B12: Vitamin B-12: 422 pg/mL (ref 232–1245)

## 2023-12-15 ENCOUNTER — Ambulatory Visit: Payer: Self-pay | Admitting: Family Medicine

## 2023-12-21 DIAGNOSIS — K219 Gastro-esophageal reflux disease without esophagitis: Secondary | ICD-10-CM | POA: Diagnosis not present

## 2023-12-21 DIAGNOSIS — A048 Other specified bacterial intestinal infections: Secondary | ICD-10-CM | POA: Diagnosis not present

## 2023-12-23 DIAGNOSIS — Z23 Encounter for immunization: Secondary | ICD-10-CM | POA: Diagnosis not present

## 2023-12-29 ENCOUNTER — Encounter: Payer: Self-pay | Admitting: Pediatrics

## 2023-12-31 ENCOUNTER — Encounter: Payer: Self-pay | Admitting: Pediatrics

## 2023-12-31 DIAGNOSIS — Z0001 Encounter for general adult medical examination with abnormal findings: Secondary | ICD-10-CM | POA: Diagnosis not present

## 2023-12-31 DIAGNOSIS — Z3045 Encounter for surveillance of transdermal patch hormonal contraceptive device: Secondary | ICD-10-CM | POA: Diagnosis not present

## 2023-12-31 DIAGNOSIS — R102 Pelvic and perineal pain: Secondary | ICD-10-CM | POA: Diagnosis not present

## 2024-01-02 MED ORDER — SUCRALFATE 1 GM/10ML PO SUSP: 1.0000 g | Freq: Four times a day (QID) | ORAL | 3 refills | Status: AC

## 2024-01-02 NOTE — Addendum Note (Signed)
 Addended by: SUZANN MOM on: 01/02/2024 03:16 PM   Modules accepted: Orders

## 2024-01-05 ENCOUNTER — Ambulatory Visit

## 2024-01-18 ENCOUNTER — Ambulatory Visit: Admitting: Family Medicine

## 2024-01-18 ENCOUNTER — Encounter: Payer: Self-pay | Admitting: Family Medicine

## 2024-01-18 VITALS — BP 120/82 | HR 59 | Temp 98.5°F | Ht 60.0 in | Wt 136.0 lb

## 2024-01-18 DIAGNOSIS — F3289 Other specified depressive episodes: Secondary | ICD-10-CM | POA: Diagnosis not present

## 2024-01-18 DIAGNOSIS — E663 Overweight: Secondary | ICD-10-CM

## 2024-01-18 DIAGNOSIS — K909 Intestinal malabsorption, unspecified: Secondary | ICD-10-CM

## 2024-01-18 DIAGNOSIS — K219 Gastro-esophageal reflux disease without esophagitis: Secondary | ICD-10-CM | POA: Diagnosis not present

## 2024-01-18 DIAGNOSIS — D509 Iron deficiency anemia, unspecified: Secondary | ICD-10-CM | POA: Diagnosis not present

## 2024-01-18 DIAGNOSIS — F5089 Other specified eating disorder: Secondary | ICD-10-CM | POA: Diagnosis not present

## 2024-01-18 DIAGNOSIS — Z9884 Bariatric surgery status: Secondary | ICD-10-CM | POA: Diagnosis not present

## 2024-01-18 DIAGNOSIS — Z6826 Body mass index (BMI) 26.0-26.9, adult: Secondary | ICD-10-CM

## 2024-01-18 MED ORDER — BUPROPION HCL ER (XL) 300 MG PO TB24: 300.0000 mg | ORAL_TABLET | Freq: Every day | ORAL | 2 refills | Status: DC

## 2024-01-18 MED ORDER — PANTOPRAZOLE SODIUM 40 MG PO TBEC
40.0000 mg | DELAYED_RELEASE_TABLET | Freq: Two times a day (BID) | ORAL | 0 refills | Status: DC
Start: 2024-01-18 — End: 2024-04-18

## 2024-01-18 MED ORDER — FERROUS GLUCONATE 324 (38 FE) MG PO TABS: 324.0000 mg | ORAL_TABLET | Freq: Every day | ORAL | 1 refills | Status: DC

## 2024-01-18 NOTE — Patient Instructions (Addendum)
 Protein smoothie in the blender:  Dannon Light and Fit Vanilla Greek Yogurt  + a splash of FairLife Milk + a handful of frozen fruit  To add fiber, you can add a handful of spinach or a teaspoon of chia seeds  Protein bars: TruBar Barebells Aloha bar (( Read labels for gelatin ))

## 2024-01-18 NOTE — Progress Notes (Signed)
 Office: [phone]  /  Fax: [phone]  WEIGHT SUMMARY AND BIOMETRICS  Starting Date: 10/07/22  Starting Weight: 154lb   Weight Lost Since Last Visit: 0lb   Vitals Temp: 98.5 F (36.9 C) BP: 120/82 Pulse Rate: (!) 59 SpO2: 100 %   Body Composition  Body Fat %: 38.6 % Fat Mass (lbs): 52.8 lbs Muscle Mass (lbs): 79.6 lbs Total Body Water (lbs): 66.4 lbs Visceral Fat Rating : 6   HPI  Chief Complaint: OBESITY  Analy is here to discuss her progress with her obesity treatment plan. She is on the following a lower carbohydrate, vegetable and lean protein rich diet plan and states she is following her eating plan approximately 30 % of the time. She states she is exercising 40 minutes 3 times per week.  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 Body mass index is 26.56 kg/m.  General: She is healthy appearing, cooperative, alert, well developed, and in no acute distress. PSYCH: Has normal mood, affect and thought process.   Lungs: Normal breathing effort, no conversational dyspnea.  ASSESSMENT AND PLAN  TREATMENT PLAN FOR OBESITY:  Recommended Dietary  Goals  Tamiah is currently in the action stage of change. As such, her goal is to continue weight management plan. She has agreed to keeping a food journal and adhering to recommended goals of 1300 calories and 85+ grams of protein and following a lower carbohydrate, vegetable and lean protein rich diet plan.  Behavioral Intervention  We discussed the following Behavioral Modification Strategies today: increasing lean protein intake to established goals, increasing vegetables, increasing lower glycemic fruits, increasing fiber rich foods, avoiding skipping meals, increasing water intake , work on meal planning and preparation, keeping healthy foods at home, decreasing eating out or consumption of processed foods, and making healthy choices when eating convenient foods, work on managing stress, creating time for self-care and relaxation, avoiding temptations and identifying enticing environmental cues, and continue to work on maintaining a reduced calorie state, getting the recommended amount of protein, incorporating whole foods, making healthy choices, staying well hydrated and practicing mindfulness when eating..  Additional resources provided today: NA  Recommended Physical Activity Goals  Kathryne has been advised to work up to 150 minutes of moderate intensity aerobic activity a week and strengthening exercises 2-3 times per week for cardiovascular health, weight loss maintenance and preservation of muscle mass.   She has agreed to Increase physical activity in their day and reduce sedentary time (increase NEAT). and Start strengthening exercises with a goal of 2-3 sessions a week  We discussed the importance of both cardio and resistance training for maintenance of weight loss  Pharmacotherapy changes for the treatment of obesity: None  ASSOCIATED CONDITIONS ADDRESSED TODAY  S/P laparoscopic sleeve gastrectomy She has done great now at her current nadir weight and happy weight post vertical  sleeve gastrectomy.  Her previous nadir weight has been 154 pounds.  She feels good at this current weight with a BMI of 26.  She has adequate restriction from her sleeve gastrectomy and is doing better with a focus on lean protein and fiber at mealtime Work on hydrating well with water and sugar-free fluids outside of mealtime Add in resistance training 2 days a week Continue a bariatric multivitamin daily  Gastroesophageal reflux disease, unspecified whether esophagitis present She is still symptomatic on pantoprazole  40 mg twice daily.  She is managed by Dr. Suzann, reporting a negative H. pylori result on 12/29/2023.  Carafate  was added.  Ibuprofen  800 mg has been discontinued from her medication list as this may contribute to dyspepsia.  Follow-up with GI for continued symptoms. -     Pantoprazole  Sodium; Take 1 tablet (40 mg total) by mouth 2 (two) times daily.  Dispense: 180 tablet; Refill: 0  Other depression with emotional eating Emotional eating and mood in general has improved on bupropion  XL 300 mg once daily without adverse side effects.  Continue to continue to work on stress reduction. -     buPROPion  HCl ER (XL); Take 1 tablet (300 mg total) by mouth daily.  Dispense: 30 tablet; Refill: 2  Iron deficiency anemia, unspecified iron deficiency anemia type Her menses continue to arrive every 2 to 3 weeks.  She has since been started on a birth control patch from her OB/GYN and this is starting to help.  She worries about continued iron deficiency.  Her energy level has improved.  Resume prescription oral iron ferrous gluconate  324 mg once daily. -     Ferrous Gluconate ; Take 1 tablet (324 mg total) by mouth daily with breakfast.  Dispense: 30 tablet; Refill: 1  Intestinal malabsorption, unspecified type Bariatric labs are reviewed and up-to-date.  She remains on a bariatric multivitamin, OTC vitamin D  daily at 3000 IU daily, prescription oral iron as discussed above.  B12 levels have  been stable. Lab Results  Component Value Date   VITAMINB12 422 12/13/2023  Continue current supplements.  Overweight (BMI 25.0-29.9)  BMI 26.0-26.9,adult      She was informed of the importance of frequent follow up visits to maximize her success with intensive lifestyle modifications for her multiple health conditions.   ATTESTASTION STATEMENTS:  Reviewed by clinician on day of visit: allergies, medications, problem list, medical history, surgical history, family history, social history, and previous encounter notes pertinent to obesity diagnosis.   I have personally spent 32 minutes total time today in preparation, patient care, nutritional counseling and education,  and documentation for this visit, including the following: review of most recent clinical lab tests, prescribing medications/ refilling medications, reviewing medical assistant documentation, review and interpretation of bioimpedence results.     Darice Haddock, D.O. DABFM, DABOM [facility] [address] [place], [place] [address] [phone]

## 2024-01-31 ENCOUNTER — Ambulatory Visit

## 2024-01-31 DIAGNOSIS — Z789 Other specified health status: Secondary | ICD-10-CM

## 2024-01-31 NOTE — Progress Notes (Signed)
 Patient presents to nurse clinic for MMR and varicella immunizations.   Per [facility]/chart review, patient has received MMR vaccination on 12/23/23 and 01/03/24. Varicella vaccine received on 12/23/23. Patient also reports receiving childhood immunizations.   Spoke with Dr. Rumball regarding vaccinations. Recommended that patient receive immunity titers prior to additional vaccination. Dr. Rumball placed orders for labs.   Lamar advised that patient elects to come back to office at later time for labs. Per chart review, patient scheduled for lab tomorrow afternoon.   Chiquita JAYSON English, RN

## 2024-02-01 ENCOUNTER — Other Ambulatory Visit

## 2024-02-01 DIAGNOSIS — Z789 Other specified health status: Secondary | ICD-10-CM

## 2024-02-02 ENCOUNTER — Ambulatory Visit: Payer: Self-pay | Admitting: Family Medicine

## 2024-02-02 LAB — VARICELLA ZOSTER ANTIBODY, IGG: Varicella zoster IgG: REACTIVE

## 2024-02-02 LAB — MEASLES/MUMPS/RUBELLA IMMUNITY
MUMPS ABS, IGG: >300 [AU]/ml (ref >10.9)
RUBEOLA AB, IGG: 234 [AU]/ml (ref >16.4)
Rubella Antibodies, IGG: 12.8 {index} (ref >0.99)

## 2024-02-09 ENCOUNTER — Ambulatory Visit

## 2024-03-06 ENCOUNTER — Encounter: Payer: Self-pay | Admitting: Family Medicine

## 2024-03-06 ENCOUNTER — Ambulatory Visit: Admitting: Family Medicine

## 2024-03-06 VITALS — BP 109/75 | HR 53 | Temp 97.5°F | Ht 60.0 in | Wt 141.0 lb

## 2024-03-06 DIAGNOSIS — F3289 Other specified depressive episodes: Secondary | ICD-10-CM | POA: Diagnosis not present

## 2024-03-06 DIAGNOSIS — Z6827 Body mass index (BMI) 27.0-27.9, adult: Secondary | ICD-10-CM | POA: Diagnosis not present

## 2024-03-06 DIAGNOSIS — D509 Iron deficiency anemia, unspecified: Secondary | ICD-10-CM

## 2024-03-06 DIAGNOSIS — Z9884 Bariatric surgery status: Secondary | ICD-10-CM | POA: Diagnosis not present

## 2024-03-06 DIAGNOSIS — F5089 Other specified eating disorder: Secondary | ICD-10-CM | POA: Diagnosis not present

## 2024-03-06 DIAGNOSIS — R600 Localized edema: Secondary | ICD-10-CM | POA: Diagnosis not present

## 2024-03-06 DIAGNOSIS — E663 Overweight: Secondary | ICD-10-CM | POA: Diagnosis not present

## 2024-03-06 MED ORDER — FERROUS GLUCONATE 324 (38 FE) MG PO TABS: 324.0000 mg | ORAL_TABLET | Freq: Every day | ORAL | 1 refills | Status: DC

## 2024-03-06 MED ORDER — BUPROPION HCL ER (XL) 300 MG PO TB24: 300.0000 mg | ORAL_TABLET | Freq: Every day | ORAL | 2 refills | Status: DC

## 2024-03-06 NOTE — Progress Notes (Signed)
 Office: [phone]  /  Fax: [phone]  WEIGHT SUMMARY AND BIOMETRICS  Starting Date: 10/07/22  Starting Weight: 154lb   Weight Lost Since Last Visit: 0lb   Vitals Temp: (!) 97.5 F (36.4 C) BP: 109/75 Pulse Rate: (!) 53 SpO2: 100 %   Body Composition  Body Fat %: 40 % Fat Mass (lbs): 56.8 lbs Muscle Mass (lbs): 80.6 lbs Total Body Water (lbs): 69.8 lbs Visceral Fat Rating : 7    HPI  Chief Complaint: OBESITY  Morgan Morgan [name] Morgan [name]. She is on the following a lower carbohydrate, vegetable and lean protein rich diet Morgan [name] and states she is following her eating Morgan [name] approximately 0 % of the time. She states she is exercising 0 minutes 0 times per week.  Interval History:  Since last office visit she is up 5 lb She did go on 2 trips since her last visit and celebrated her birthday She admits to getting off track with food choices when vacationing She is struggling to get in protein now that she is back home, eating more carbs She is craving more sweets She is using the birth control patch and her appetite has increased Stress levels have been higher and she has been doing more emotional eating She hasn't been working out with travel and her energy level has been low [facility]  Pharmacotherapy: Wellbutrin  XL 300 mg daily  PHYSICAL EXAM:  Blood pressure 109/75, pulse (!) 53, temperature (!) 97.5 F (36.4 C), height 5' (1.524 m), weight 141 lb (64 kg), last menstrual period 02/22/2024, SpO2 100%, not currently breastfeeding. Body mass index is 27.54 kg/m.  General: She is healthy appearing, cooperative, alert, well developed, and in no acute distress. PSYCH: Has normal mood, affect and thought process.   Lungs: Normal breathing effort, no conversational dyspnea. Extremities: 2+ ankle edema  ASSESSMENT AND Morgan [name]  TREATMENT Morgan [name] FOR OBESITY:  Recommended Dietary Goals  Morgan Morgan [name] Morgan [name]. As such, her goal is to continue weight management Morgan [name]. She has agreed to a reduced kcal higher protein/ lower carb diet Reviewed specific dietary Morgan [name] goals on AVS with patient  Behavioral Intervention  We discussed the following Behavioral Modification Strategies today: increasing lean protein intake to established goals, decreasing simple carbohydrates , increasing water intake , work on meal planning and preparation, keeping healthy foods at home, work on managing stress, creating time for self-care and relaxation, avoiding temptations and identifying enticing environmental cues, planning for success, and better snacking choices.  Additional resources provided today: NA  Recommended Physical Activity Goals  Morgan Morgan [name] Morgan [name] to work up to 150 minutes of moderate intensity aerobic activity a week and strengthening exercises 2-3 times per week for cardiovascular health, weight loss maintenance and preservation of muscle mass.   She has agreed to Increase volume of physical activity to a goal of 240 minutes a week  Pharmacotherapy changes for the treatment of obesity: none  ASSOCIATED CONDITIONS ADDRESSED TODAY  Iron deficiency anemia, unspecified iron deficiency anemia type C/o fatigue.  Doing well on RX oral iron daily.  Repeat labs next visit No longer having continues menses since [facility] was changed from copper IUD to birth control patch 4 mos ago.  Menses are lasting 7 days. -     Ferrous Gluconate ; Take 1 tablet (324 mg total) by mouth daily with breakfast.  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  She was feeling that Wellbutrin  was helping with emotional eating but has had more breakthru lately.  Continue Wellbutrin  dose.  Set up for  CBT with Dr Sharron.  Morgan [name] in walks and work on self care. -     buPROPion  HCl ER (XL); Take 1 tablet (300 mg total) by mouth daily.  Dispense: 30 tablet; Refill: 2  Overweight (BMI 25.0-29.9)  BMI 27.0-27.9,adult  S/P laparoscopic sleeve gastrectomy Improving.  Overall, she has done great post VSG 2022 in [place], down from 240 lb.  136 lb was her nadir weight at last visit.  She has room for improvement with lean protein intake and regular exercise.   Bilateral leg edema New She has an increase in bilateral leg edema Admits to a poor diet with 2 recent vacations, eating out more, consuming more fast food, bread and starches.  She denies voiding problems or constipation. We discussed a Morgan [name] for diuresis on her AVS, limiting high sodium foods, increasing walking time, consuming lemon water and hydrating fruits and veggies.  F/u 3 weeks to recheck BMP with iron.   She was informed of the importance of frequent follow up visits to maximize her success with intensive lifestyle modifications for her multiple health conditions.   ATTESTASTION STATEMENTS:  Reviewed by clinician on day of visit: allergies, medications, problem list, medical history, surgical history, family history, social history, and previous encounter notes pertinent to obesity diagnosis.   I have personally spent 30 minutes total time today in preparation, patient care, nutritional counseling and education,  and documentation for this visit, including the following: review of most recent clinical lab tests, prescribing medications/ refilling medications, reviewing medical assistant documentation, review and interpretation of bioimpedence results.     Morgan Morgan [name] Morgan [name], D.O. DABFM, DABOM [facility] [address] [place], [place] [address] [phone]

## 2024-03-06 NOTE — Patient Instructions (Addendum)
 Work on eating on a schedule: Remember lean protein with each meal Drink water outside of mealtime Avoid high sugar foods and drinks  Try adding in: Lemon juice in water Cucumber slices One serving of watermelon Avoid high sodium foods to help with swelling  Reduce intake of BREAD (this can worsen your fluid retention) Fiber one square or Built Puff bar as a snack Avoid fast food as much as possible  Visit with Dr Sharron via telemedicine  Try unsweetened double shot of espresso - mix in a protein shake (Premier Protein, FairLife shake)  Protein powders to try: Garden of Life (pea or whey protein) Clean Simple Eats Just Ingredients (Sprouts)

## 2024-03-13 ENCOUNTER — Telehealth (INDEPENDENT_AMBULATORY_CARE_PROVIDER_SITE_OTHER): Admitting: Psychology

## 2024-03-13 DIAGNOSIS — F3289 Other specified depressive episodes: Secondary | ICD-10-CM

## 2024-03-13 DIAGNOSIS — F419 Anxiety disorder, unspecified: Secondary | ICD-10-CM | POA: Diagnosis not present

## 2024-03-13 DIAGNOSIS — F32A Depression, unspecified: Secondary | ICD-10-CM | POA: Diagnosis not present

## 2024-03-13 NOTE — Progress Notes (Signed)
 Office: [phone]  /  Fax: [phone]    Date: March 13, 2024    Appointment Start Time: 12:01pm Duration: 44 minutes Provider: Wyatt Fire, Psy.D. Type of Session: Intake for Individual Therapy  Location of Patient: Home (private location) Location of Provider: Provider's home (private office) Type of Contact: Telepsychological Visit via MyChart Video Visit  Informed Consent: Prior to proceeding with today's appointment, two pieces of identifying information were obtained. In addition, Jadence's physical location at the time of this appointment was obtained as well a phone number she could be reached at in the event of technical difficulties. Edell and this provider participated in today's telepsychological service.   The provider's role was explained to Lattie Hago Elimam. The provider reviewed and discussed issues of confidentiality, privacy, and limits therein (e.g., reporting obligations). In addition to verbal informed consent, written informed consent for psychological services was obtained prior to the initial appointment. Since the clinic is not a 24/7 crisis center, mental health emergency resources were shared and this  provider explained MyChart, e-mail, voicemail, and/or other messaging systems should be utilized only for non-emergency reasons. This provider also explained that information obtained during appointments will be placed in Amaree's medical record and relevant information will be shared with other providers at [facility] at any locations for coordination of care. Shawniece agreed information may be shared with other [facility] providers as needed for coordination of care and by signing the service agreement document, she provided written consent for coordination of care. Prior to initiating telepsychological services, Karinna completed an informed consent document, which included the development of a safety plan (i.e., an emergency contact and  emergency resources) in the event of an emergency/crisis. Janisha verbally acknowledged understanding she is ultimately responsible for understanding her insurance benefits for telepsychological and in-person services. This provider also reviewed confidentiality, as it relates to telepsychological services. Rashauna  acknowledged understanding that appointments cannot be recorded without both party consent and she is aware she is responsible for securing confidentiality on her end of the session. Gerrica verbally consented to proceed.  Chief Complaint/HPI: Klover was referred by Dr. Darice Haddock on 03/06/2024 due to other depression with emotional eating. Per the note for the OV, Worsening with high stress levels since kids went back to school, she is working part time and studying in preparation to apply for a pediatric residency program in the Spring. She was feeling that Wellbutrin  was helping with emotional eating but has had more breakthru lately. Continue Wellbutrin  dose. Set up for CBT with Dr Fire. Plan in walks and work on self care.   During today's appointment, Richie was verbally administered a questionnaire assessing various behaviors related to emotional eating behaviors. Martena endorsed the following: experience food cravings on a regular basis, eat certain foods when you are anxious, stressed, depressed, or your feelings are hurt, find food is comforting to you, overeat when you are angry or upset, not worry about what you eat when you are in a good mood, overeat when you are alone, but eat much less when you are with other people, and eat as a reward. She shared she craves sweets and savory foods. Latitia believes the onset of emotional eating behaviors was likely during college years and described the current frequency of emotional eating behaviors as daily. Of note, she reported a history of bariatric surgery in August 2022 in [place]. In addition, Jayci endorsed a history of binge eating  behaviors, noting she last engaged in  binge eating behaviors more than 3 or 4 years ago. Lycia denied a history of significantly restricting food intake, purging and engagement in other compensatory strategies for weight loss, and has never been diagnosed with an eating disorder. She also denied a history of treatment for emotional and binge eating behaviors. Currently, Shandiin indicated she is prescribed a low carb meal plan, noting, I'm gaining weight despite that. Furthermore, Deneene reported experiencing current stressors, including her family being overseas and her mother is ill.   Mental Status Examination:  Appearance: neat Behavior: appropriate to circumstances Mood: sad Affect: mood congruent; tearful at times Speech: WNL Eye Contact: appropriate Psychomotor Activity: WNL Gait: unable to assess  Thought Process: linear, logical, and goal directed and denies suicidal, homicidal, and self-harm ideation, plan and intent  Thought Content/Perception: no hallucinations, delusions, bizarre thinking or behavior endorsed or observed Orientation: AAOx4 Memory/Concentration: intact Insight/Judgment: fair  Family & Psychosocial History: Nadyne reported she is married and she has three boys (ages 50, 75, and 5). 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 She further explained she is studying for her boards, which is also stressful. Additionally, Valborg shared her highest level of education obtained is a bachelor's degree in medicine and surgery. 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 Currently, Roselinda's social support system consists of her husband, father, mother, sister, friends, and in-laws. Moreover, Azie stated she resides with her husband, children, and father.   Medical History:  Past Medical History:  Diagnosis Date   Anemia    Back pain    Complication of anesthesia    Patient states when given the epidural the  numbness went up to her nose.    Edema    GERD (gastroesophageal reflux disease)    H. pylori infection 07/2023   treated with rifabutin , amoxacillin, PPI   Heartburn    Hepatitis    Hepatitis B carrier (HCC)    IBS (irritable bowel syndrome)    Obesity    Osteoarthritis    Palpitations    Pneumonia    Vitamin D  deficiency    Past Surgical History:  Procedure Laterality Date   CARPAL TUNNEL RELEASE     gastric balloon  01/28/2021   ocular muscle corrective surgery     SLEEVE GASTROPLASTY  2022   Current Outpatient Medications on File Prior to Visit  Medication Sig Dispense Refill   acetaminophen  (TYLENOL ) 500 MG tablet Take 500-1,000 mg by mouth every 6 (six) hours as needed for moderate pain (pain score 4-6).     baclofen  (LIORESAL ) 10 MG tablet Take 1 tablet (10 mg total) by mouth at bedtime. 90 tablet 3   buPROPion  (WELLBUTRIN  XL) 300 MG 24 hr tablet Take 1 tablet (300 mg total) by mouth daily. 30 tablet 2   cetirizine  (ZYRTEC ) 10 MG tablet Take 1 tablet (10 mg total) by mouth daily. 30 tablet 11   ferrous gluconate  (FERGON) 324 MG tablet Take 1 tablet (324 mg total) by mouth daily with breakfast. 30 tablet 1   fluticasone  (FLONASE ) 50 MCG/ACT nasal spray Place 2 sprays into both nostrils daily. 16 g 6   Multiple Vitamin (MULTIVITAMIN) tablet Take 1 tablet by mouth daily.     ondansetron  (ZOFRAN ) 4 MG tablet Take 1 tablet (4 mg total) by mouth every 8 (eight) hours as needed for nausea or vomiting. 20 tablet 0   pantoprazole  (PROTONIX ) 40 MG tablet Take 1 tablet (40 mg total) by mouth  2 (two) times daily. 180 tablet 0   sucralfate  (CARAFATE ) 1 GM/10ML suspension Take 10 mLs (1 g total) by mouth 4 (four) times daily. 420 mL 3   TWIRLA 120-30 MCG/24HR PTWK Apply 1 patch topically once a week.     No current facility-administered medications on file prior to visit.  Bronte stated she is medication compliant.   Mental Health History: Maddyx denied a history of therapeutic  services. She is currently prescribed Wellbutrin  by Dr. Waylan to assist with cravings. Jaelynne reported there is no history of hospitalizations for psychiatric concerns. She denied a family history of mental health/substance abuse related concerns. Furthermore, Murl reported there is no history of trauma including psychological, physical , and sexual abuse, as well as neglect.   Analena described her typical mood lately as not good. She reported, she is really down due to feeling she has not accomplished what she hoped to and also overwhelmed with her parental duties. She also endorsed ongoing worry and what if scenarios regarding her children's well-being. Aliena further endorsed experiencing the following: occasional social withdrawal; crying spells; and forgetfulness, misplacing items, and difficulty finishing house chores which she described as occurring secondary to depressed mood.  Mailee denied a history of illicit/recreational substance use. Furthermore, Tesia indicated she is not experiencing the following: hallucinations and delusions, paranoia, symptoms of mania , panic attacks, memory concerns, and obsessions and compulsions. She also denied history of and current suicidal ideation, plan, and intent; history of and current homicidal ideation, plan, and intent; and history of and current engagement in self-harm.  Legal History: Shaquinta reported there is no history of legal involvement.   Structured Assessments Results: The Patient Health Questionnaire-9 (PHQ-9) is a self-report measure that assesses symptoms and severity of depression over the course of the last two weeks. Jailin obtained a score of 10 suggesting moderate depression. Belanna finds the endorsed symptoms to be very difficult. [0= Not at all; 1= Several days; 2= More than half the days; 3= Nearly every day] Little interest or pleasure in doing things 0  Feeling down, depressed, or hopeless 1  Trouble falling or staying asleep, or  sleeping too much-fluctuations 2  Feeling tired or having little energy- unsure if it is related to mood or medical concerns 3  Poor appetite or overeating 1  Feeling bad about yourself --- or that you are a failure or have let yourself or your family down 3  Trouble concentrating on things, such as reading the newspaper or watching television 0  Moving or speaking so slowly that other people could have noticed? Or the opposite --- being so fidgety or restless that you have been moving around a lot more than usual 0  Thoughts that you would be better off dead or hurting yourself in some way 0  PHQ-9 Score 10    The Generalized Anxiety Disorder-7 (GAD-7) is a brief self-report measure that assesses symptoms of anxiety over the course of the last two weeks. Fajr obtained a score of 3 suggesting minimal anxiety. Lonnette finds the endorsed symptoms to be somewhat difficult. [0= Not at all; 1= Several days; 2= Over half the days; 3= Nearly every day] Feeling nervous, anxious, on edge 1  Not being able to stop or control worrying 0  Worrying too much about different things 1  Trouble relaxing 0  Being so restless that it's hard to sit still 0  Becoming easily annoyed or irritable 1  Feeling afraid as if something awful might happen 0  GAD-7 Score 3   Interventions:  Conducted a chart review Focused on rapport building Verbally administered PHQ-9 and GAD-7 for symptom monitoring Verbally administered Food & Mood questionnaire to assess various behaviors related to emotional eating Provided emphatic reflections and validation Psychoeducation provided regarding physical versus emotional hunger  Recommended/discussed option(s) for longer-term therapeutic services  Diagnostic Impressions & Provisional DSM-5 Diagnosis(es): Christianne endorsed a history of engagement in emotional and binge eating behaviors. She shared she had bariatric surgery in [place] in 2022, noting she ceased engagement in binge  eating behaviors prior to her surgery . She described the current frequency of engagement in emotional eating behaviors a daily. Marlyss denied engagement in any other disordered eating behaviors. Based on the aforementioned, the following diagnosis was assigned: F50.89 Other Specified Feeding or Eating Disorder, Emotional Eating Behaviors. Moreover, Ahnesti also reported experiencing depression-related symptomatology and endorsed items on the administered measures. She also described experiencing ongoing worry regarding her children's well-being. Given the limited scope of this appointment and this provider's role with the clinic, the following diagnoses were assigned: F41.9 Unspecified Anxiety Disorder and  F32.A Unspecified Depressive Disorder.  Plan: Nishtha appears able and willing to participate as evidenced by engagement in reciprocal conversation and asking questions as needed for clarification. The next appointment is scheduled for 03/27/2024 at 11:30am, which will be via MyChart Video Visit. The following treatment goal was established: increase coping skills. This provider will regularly review the treatment plan and medical chart to keep informed of status changes. Layla expressed understanding and agreement with the initial treatment plan of care.   Gaylynn will be sent a handout via e-mail to utilize between now and the next appointment to increase awareness of hunger patterns and subsequent eating. Tenika provided verbal consent during today's appointment for this provider to send the handout via e-mail. Additionally, Maleya provided verbal consent for this provider to e-mail referral options.    Wyatt Fire, PsyD

## 2024-03-27 ENCOUNTER — Telehealth (INDEPENDENT_AMBULATORY_CARE_PROVIDER_SITE_OTHER): Admitting: Psychology

## 2024-03-27 ENCOUNTER — Encounter: Payer: Self-pay | Admitting: Family Medicine

## 2024-03-27 ENCOUNTER — Ambulatory Visit: Admitting: Family Medicine

## 2024-03-27 VITALS — BP 115/82 | HR 72 | Ht 60.0 in | Wt 145.0 lb

## 2024-03-27 DIAGNOSIS — F32A Depression, unspecified: Secondary | ICD-10-CM | POA: Diagnosis not present

## 2024-03-27 DIAGNOSIS — F5089 Other specified eating disorder: Secondary | ICD-10-CM

## 2024-03-27 DIAGNOSIS — Z6828 Body mass index (BMI) 28.0-28.9, adult: Secondary | ICD-10-CM | POA: Diagnosis not present

## 2024-03-27 DIAGNOSIS — Z9884 Bariatric surgery status: Secondary | ICD-10-CM

## 2024-03-27 DIAGNOSIS — F3289 Other specified depressive episodes: Secondary | ICD-10-CM | POA: Diagnosis not present

## 2024-03-27 DIAGNOSIS — K219 Gastro-esophageal reflux disease without esophagitis: Secondary | ICD-10-CM

## 2024-03-27 DIAGNOSIS — F419 Anxiety disorder, unspecified: Secondary | ICD-10-CM

## 2024-03-27 DIAGNOSIS — E663 Overweight: Secondary | ICD-10-CM

## 2024-03-27 MED ORDER — PHENTERMINE-TOPIRAMATE ER 3.75-23 MG PO CP24
ORAL_CAPSULE | ORAL | 0 refills | Status: DC
Start: 2024-03-27 — End: 2024-05-11

## 2024-03-27 MED ORDER — PHENTERMINE-TOPIRAMATE ER 7.5-46 MG PO CP24: 1.0000 | ORAL_CAPSULE | Freq: Every day | ORAL | 0 refills | Status: DC

## 2024-03-27 NOTE — Patient Instructions (Signed)
 Begin Qsymia 3.75/23 mg with breakfast (or without) in the morning daily x 14 days Then increase to Qsymia 7.5/46 mg with breakfast (or without) daily  Call if any problems or questions  Be sure to get in 20+ g of protein with meals and 1-2 high protein snacks daily Keep junk foods/ sweets out of sight Allow 2 fresh fruit servings daily and plenty of veggies Hydrate well with water outside of meals Aim for 8 hrs of sleep at night Continue counseling  Restart bariatric MVI daily Repeat vitamin D  level next visit

## 2024-03-27 NOTE — Progress Notes (Signed)
 Office: [phone]  /  Fax: [phone]  WEIGHT SUMMARY AND BIOMETRICS  Starting Date: 10/07/22  Starting Weight: 154lb   Weight Lost Since Last Visit: 0lb   Vitals BP: 115/82 Pulse Rate: 72 SpO2: 100 %   Body Composition  Body Fat %: 41 % Fat Mass (lbs): 59.6 lbs Muscle Mass (lbs): 81.4 lbs Total Body Water (lbs): 70.2 lbs Visceral Fat Rating : 7     HPI  Chief Complaint: OBESITY  Morgan [name] is here to discuss her progress with her obesity treatment plan. She is on the following a lower carbohydrate, vegetable and lean protein rich diet plan and states she is following her eating plan approximately 70 % of the time. She states she is exercising 30 minutes 2 times per week.  Interval History:  Since last office visit she is up 4 pounds This gives her net weight loss of 9 pounds in the past 1.5 years of medically supervised weight management and treatment for postop regain status post vertical sleeve gastrectomy done in [place] in 2022 with a nadir weight of 136 pounds She admits to increased hunger and cravings in the evenings.  Stress of recently She is struggling to stay in time for self-care and exercise. She has been more mindful of lean protein intake with meals She had failed to see adequate weight loss alone Seeing an increase in emotional eating with Wellbutrin  XL 300 mg each morning She started CBT with Dr. Sharron  Pharmacotherapy: Wellbutrin  XL 300 mg each morning  PHYSICAL EXAM:  Blood pressure 115/82, pulse 72, height 5' (1.524 m), weight 145 lb (65.8 kg), last menstrual period 02/22/2024, SpO2 100%. Body mass index is 28.32 kg/m.  General: She is overweight, cooperative, alert, well developed, and in no acute distress. PSYCH: Has normal mood, affect and thought process.   Lungs: Normal breathing effort, no conversational dyspnea.   ASSESSMENT AND PLAN  TREATMENT PLAN FOR OBESITY:  Recommended Dietary Goals  Morgan [name] is currently in the  action stage of change. As such, her goal is to continue weight management plan. She has agreed to keeping a food journal and adhering to recommended goals of 1300 calories and 80 g of protein and following a lower carbohydrate, vegetable and lean protein rich diet plan.  Behavioral Intervention  We discussed the following Behavioral Modification Strategies today: increasing lean protein intake to established goals, decreasing simple carbohydrates , increasing fiber rich foods, avoiding skipping meals, increasing water intake , work on meal planning and preparation, keeping healthy foods at home, identifying sources and decreasing liquid calories, work on managing stress, creating time for self-care and relaxation, avoiding temptations and identifying enticing environmental cues, and planning for success.  Additional resources provided today: NA  Recommended Physical Activity Goals  Morgan [name] has been advised to work up to 150 minutes of moderate intensity aerobic activity a week and strengthening exercises 2-3 times per week for cardiovascular health, weight loss maintenance and preservation of muscle mass.   She has agreed to Start aerobic activity with a goal of 150 minutes a week at moderate intensity.  Track daily steps using a smart watch with a goal of 10,000 steps daily, finding ways to increase steps even with busy life schedule  Pharmacotherapy changes for the treatment of obesity: Begin Qsymia 3.75/23 mg once daily with breakfast for 14 days then increase to 7.5/46 mg once daily with breakfast PDMP reviewed and informed consent was previously signed for [place]   ASSOCIATED CONDITIONS ADDRESSED TODAY  Other Specified Feeding  or Eating Disorder, Emotional Eating Behaviors She has had an increase in emotional eating with high stress levels at home. We discussed the importance of self-care including time for herself with regular exercise, getting in meals and meal planning. She has started  CBT with Dr. Sharron She has seen some improvements on Wellbutrin  XL 300 mg each morning  Overweight (BMI 25.0-29.9) -     Phentermine -Topiramate ER; 1 capsule po qAM  Dispense: 14 capsule; Refill: 0 -     Phentermine -Topiramate ER; Take 1 capsule by mouth daily.  Dispense: 30 capsule; Refill: 0 She has a lack of control with increased hunger and cravings especially at night, triggered by high anxiety and emotions.  Encouraged her to make sure she is getting an adequate lean protein and fiber with meals, avoiding meal skipping and hydrating well with water between meals.  Discussed the mechanism of action and potential adverse side effects of Qsymia.  Avoid pregnancy while on Qsymia due to risk of teratogenic side effects.  She does have a form of birth control: Birth control patch  Gastroesophageal reflux disease, unspecified whether esophagitis present -     Phentermine -Topiramate ER; 1 capsule po qAM  Dispense: 14 capsule; Refill: 0 -     Phentermine -Topiramate ER; Take 1 capsule by mouth daily.  Dispense: 30 capsule; Refill: 0 She is currently on pantoprazole  40 mg twice daily for GERD and has used Carafate  with Dr. Suzann.  Follow-up for any breakthrough acid reflux with gastroenterology  BMI 28.0-28.9,adult  S/P laparoscopic sleeve gastrectomy She has some volume control at mealtime from her previous sleeve gastrectomy which is struggling more with increased hunger and cravings  Reviewed plan of care above to track protein intake, track calorie intake, increase daily steps, work on stress reduction and begin use of Qsymia     She was informed of the importance of frequent follow up visits to maximize her success with intensive lifestyle modifications for her multiple health conditions.   ATTESTASTION STATEMENTS:  Reviewed by clinician on day of visit: allergies, medications, problem list, medical history, surgical history, family history, social history, and previous encounter  notes pertinent to obesity diagnosis.   I have personally spent 33 minutes total time today in preparation, patient care, nutritional counseling and education,  and documentation for this visit, including the following: review of most recent clinical lab tests, prescribing medications/ refilling medications, reviewing medical assistant documentation, review and interpretation of bioimpedence results.     Darice Haddock, D.O. DABFM, DABOM [facility] [address] [place], [place] [address] [phone]

## 2024-03-27 NOTE — Progress Notes (Signed)
  Office: [phone]  /  Fax: [phone]    Date: March 27, 2024  Appointment Start Time: 11:31am Duration: 24 minutes Provider: Wyatt Fire, Psy.D. Type of Session: Individual Therapy  Location of Patient: Home (private location) Location of Provider: [facility] at [facility] Type of Contact: Telepsychological Visit via MyChart Video Visit  Session Content: Morgan [name] is a 38 y.o. female presenting for a follow-up appointment to address the previously established treatment goal of increasing coping skills.Today's appointment was a telepsychological visit. Morgan [name] provided verbal consent for today's telepsychological appointment and she is aware she is responsible for securing confidentiality on her end of the session. Prior to proceeding with today's appointment, Morgan [name]'s physical location at the time of this appointment was obtained as well a phone number she could be reached at in the event of technical difficulties. Morgan [name] and this provider participated in today's telepsychological service.   This provider conducted a brief check-in. Morgan [name] shared about utilizing the previously shared handout. She acknowledged, Sometimes I don't know the trigger. Psychoeducation regarding triggers for emotional eating was provided. Morgan [name] was provided a handout, and encouraged to utilize the handout between now and the next appointment to increase awareness of triggers and frequency. Morgan [name] agreed. This provider also discussed behavioral strategies for specific triggers, such as placing the utensil down when conversing to avoid mindless eating. Morgan [name] provided verbal consent during today's appointment for this provider to send a handout triggers via e-mail. Overall, Morgan [name] was receptive to today's appointment as evidenced by openness to sharing, responsiveness to feedback, and willingness to explore triggers for emotional eating.  Mental Status Examination:  Appearance: neat Behavior: appropriate to  circumstances Mood: sad Affect: mood congruent Speech: WNL Eye Contact: appropriate Psychomotor Activity: WNL Gait: unable to assess Thought Process: linear, logical, and goal directed and no evidence or endorsement of suicidal, homicidal, and self-harm ideation, plan and intent  Thought Content/Perception: no hallucinations, delusions, bizarre thinking or behavior endorsed or observed Orientation: AAOx4 Memory/Concentration: intact Insight: fair Judgment: fair  Interventions:  Conducted a brief chart review Provided empathic reflections and validation Reviewed content from the previous session Provided positive reinforcement Employed supportive psychotherapy interventions to facilitate reduced distress and to improve coping skills with identified stressors Psychoeducation provided regarding triggers for emotional eating behaviors  DSM-5 Diagnosis(es): F50.89 Other Specified Feeding or Eating Disorder, Emotional Eating Behaviors, F41.9 Unspecified Anxiety Disorder, and F32.A Unspecified Depressive Disorder  Treatment Goal & Progress: During the initial appointment with this provider, the following treatment goal was established: increase coping skills. Progress is limited, as Morgan [name] has just begun treatment with this provider; however, she is receptive to the interaction and interventions and rapport is being established.   Plan: The next appointment is scheduled for 04/10/2024 at 9:30am, which will be via MyChart Video Visit. The next session will focus on working towards the established treatment goal. Morgan [name] will f/u with shared referrals.    Wyatt Fire, PsyD

## 2024-03-29 ENCOUNTER — Encounter: Payer: Self-pay | Admitting: Pediatrics

## 2024-04-05 ENCOUNTER — Other Ambulatory Visit: Payer: Self-pay | Admitting: Oncology

## 2024-04-05 DIAGNOSIS — D509 Iron deficiency anemia, unspecified: Secondary | ICD-10-CM

## 2024-04-10 ENCOUNTER — Inpatient Hospital Stay: Admitting: Oncology

## 2024-04-10 ENCOUNTER — Inpatient Hospital Stay: Attending: Oncology

## 2024-04-10 ENCOUNTER — Encounter: Payer: Self-pay | Admitting: Oncology

## 2024-04-10 ENCOUNTER — Telehealth (INDEPENDENT_AMBULATORY_CARE_PROVIDER_SITE_OTHER): Admitting: Psychology

## 2024-04-10 VITALS — BP 107/76 | HR 70 | Temp 98.2°F | Resp 15 | Ht 60.0 in | Wt 149.4 lb

## 2024-04-10 DIAGNOSIS — F419 Anxiety disorder, unspecified: Secondary | ICD-10-CM | POA: Diagnosis not present

## 2024-04-10 DIAGNOSIS — D509 Iron deficiency anemia, unspecified: Secondary | ICD-10-CM

## 2024-04-10 DIAGNOSIS — D72819 Decreased white blood cell count, unspecified: Secondary | ICD-10-CM | POA: Diagnosis not present

## 2024-04-10 DIAGNOSIS — F32A Depression, unspecified: Secondary | ICD-10-CM | POA: Diagnosis not present

## 2024-04-10 DIAGNOSIS — F3289 Other specified depressive episodes: Secondary | ICD-10-CM | POA: Diagnosis not present

## 2024-04-10 LAB — CBC WITH DIFFERENTIAL (CANCER CENTER ONLY)
Abs Immature Granulocytes: 0 K/uL (ref 0.00–0.07)
Basophils Absolute: 0 K/uL (ref 0.0–0.1)
Basophils Relative: 1 %
Eosinophils Absolute: 0.1 K/uL (ref 0.0–0.5)
Eosinophils Relative: 4 %
HCT: 39.7 % (ref 36.0–46.0)
Hemoglobin: 12.8 g/dL (ref 12.0–15.0)
Immature Granulocytes: 0 %
Lymphocytes Relative: 44 %
Lymphs Abs: 1.5 K/uL (ref 0.7–4.0)
MCH: 25.7 pg — ABNORMAL LOW (ref 26.0–34.0)
MCHC: 32.2 g/dL (ref 30.0–36.0)
MCV: 79.6 fL — ABNORMAL LOW (ref 80.0–100.0)
Monocytes Absolute: 0.2 K/uL (ref 0.1–1.0)
Monocytes Relative: 6 %
Neutro Abs: 1.6 K/uL — ABNORMAL LOW (ref 1.7–7.7)
Neutrophils Relative %: 45 %
Platelet Count: 233 K/uL (ref 150–400)
RBC: 4.99 MIL/uL (ref 3.87–5.11)
RDW: 13 % (ref 11.5–15.5)
WBC Count: 3.4 K/uL — ABNORMAL LOW (ref 4.0–10.5)
nRBC: 0 % (ref 0.0–0.2)

## 2024-04-10 LAB — IRON AND TIBC
Iron: 94 ug/dL (ref 28–170)
Saturation Ratios: 20 % (ref 10.4–31.8)
TIBC: 470 ug/dL — ABNORMAL HIGH (ref 250–450)
UIBC: 376 ug/dL

## 2024-04-10 LAB — FERRITIN: Ferritin: 61 ng/mL (ref 11–307)

## 2024-04-10 NOTE — Progress Notes (Signed)
  Office: [phone]  /  Fax: [phone]    Date: April 10, 2024  Appointment Start Time: 9:35am Duration: 19 minutes Provider: Wyatt Fire, Psy.D. Type of Session: Individual Therapy  Location of Patient: [facility] (private location) Location of Provider: [facility] at [facility] Type of Contact: Telepsychological Visit via MyChart Video Visit  Session Content: Morgan [name] is a 37 y.o. female presenting for a follow-up appointment to address the previously established treatment goal of increasing coping skills. Today's appointment was a telepsychological visit. Morgan [name] provided verbal consent for today's telepsychological appointment and she is aware she is responsible for securing confidentiality on her end of the session. Prior to proceeding with today's appointment, Morgan [name]'s physical location at the time of this appointment was obtained as well a phone number she could be reached at in the event of technical difficulties. Morgan [name] and this provider participated in today's telepsychological service.   This provider conducted a brief check-in. Morgan [name] shared, I feel like I'm in the same place. No change. She stated she contacted shared referral options for therapeutic services, but was informed a referral was needed. She agreed to send further information to this provider for a referral to be completed. Reviewed triggers for emotional eating behaviors. She indicated that due to being busy there have been less episodes [of emotional eating]. It was reflected that boredom tends be to a frequent trigger. She agreed. To assist with coping, psychoeducation provided regarding mindful eating strategies (sit down, slowly chew, savor, simplify, and smile). Per Marchella's request, an e-mail will be sent regarding the aforementioned strategies. Overall, Morgan [name] was receptive to today's appointment as evidenced by openness to sharing, responsiveness to feedback, and  willingness to implement discussed strategies .  Mental Status Examination:  Appearance: neat Behavior: appropriate to circumstances Mood: anxious Affect: mood congruent Speech: WNL Eye Contact: appropriate and intermittent Psychomotor Activity: WNL Gait: unable to assess Thought Process: linear, logical, and goal directed and no evidence or endorsement of suicidal, homicidal, and self-harm ideation, plan and intent  Thought Content/Perception: no hallucinations, delusions, bizarre thinking or behavior endorsed or observed Orientation: AAOx4 Memory/Concentration: intact Insight: fair Judgment: fair  Interventions:  Conducted a brief chart review Provided empathic reflections and validation Reviewed content from the previous session Provided positive reinforcement Employed supportive psychotherapy interventions to facilitate reduced distress and to improve coping skills with identified stressors Psychoeducation provided regarding mindful eating strategies  DSM-5 Diagnosis(es): F50.89 Other Specified Feeding or Eating Disorder, Emotional Eating Behaviors, F41.9 Unspecified Anxiety Disorder, and F32.A Unspecified Depressive Disorder  Treatment Goal & Progress: During the initial appointment with this provider, the following treatment goal was established: increase coping skills. Morgan [name] has demonstrated progress in her goal as evidenced by increased awareness of hunger patterns and increased awareness of triggers for emotional eating behaviors. Morgan [name] also continues to demonstrate willingness to engage in learned skill(s).  Plan: The next appointment is scheduled for 04/25/2024 at 8:30am, which will be via MyChart Video Visit. The next session will focus on working towards the established treatment goal.   Wyatt Fire, PsyD

## 2024-04-10 NOTE — Assessment & Plan Note (Addendum)
 Persistent despite long-term oral iron supplementation. Symptoms included fatigue, shortness of breath, palpitations, and chest tightness.   On her initial consultation with us  on 06/01/2023, labs showed hemoglobin of 11.1, hematocrit 37, MCV 76.3.  White count 3800 with normal differential.  Platelet count normal at 273,000.  Iron studies continued to show evidence of iron deficiency with ferritin of 9, iron saturation of 5%, iron decreased at 23.  B12, folate, LDH, haptoglobin were within normal limits.  Coombs test negative.   Given persistent iron deficiency anemia, we proceeded with IV iron using Feraheme  weekly x 2 doses.  Labs today revealed improved hemoglobin of 12.8.  White count overall stable at 3400 with normal differential, ANC 1600.  Platelet count normal.  Ferritin within normal limits.  Iron studies previously showed no evidence of iron deficiency.  Repeat iron studies are pending from today.  No indication for IV iron currently.  She was advised to continue oral iron once daily.  She is tolerating this well.   Patient apparently had testing for sickle cell disease, thalassemia in the past when she was living abroad (in [place]) and workup was negative per her verbal report.  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  -RTC in 4 months for repeat labs and follow-up.

## 2024-04-10 NOTE — Assessment & Plan Note (Signed)
 Chronic low white blood cell count.  Apparently she had hematological work[place] previously.  No known cause identified yet.   On her initial consultation with us  on 06/01/2023, white count was better at 3800 with normal differential.  B12, folate were within normal limits.  ANA negative.  White count is stable at 3400 today.  On recent ER visit in January 2025, white count was normal at 8200, indicating adequate bone marrow response to stressful situations.  Picture consistent with benign neutropenia/leukopenia.   She is asymptomatic from leukopenia.  We can continue monitoring at current levels.

## 2024-04-10 NOTE — Progress Notes (Signed)
 [facility]  HEMATOLOGY CLINIC PROGRESS NOTE   PATIENT NAME: Morgan [name]   MR#: [id number] DOB: 1986-03-27  Patient Care Team: Howell Lunger, DO as PCP - General (Family Medicine) Thukkani, Arun K, MD as PCP - Cardiology (Cardiology)  Date of visit: 04/10/2024   ASSESSMENT & PLAN:   Morgan [name] is a 38 y.o. lady with a past medical history of GERD, irritable bowel syndrome, s/p laparoscopic sleeve gastrectomy in 2022, arthritis, was referred to our service in December 2024 for evaluation of anemia.    Iron deficiency anemia Persistent despite long-term oral iron supplementation. Symptoms included fatigue, shortness of breath, palpitations, and chest tightness.   On her initial consultation with us  on 06/01/2023, labs showed hemoglobin of 11.1, hematocrit 37, MCV 76.3.  White count 3800 with normal differential.  Platelet count normal at 273,000.  Iron studies continued to show evidence of iron deficiency with ferritin of 9, iron saturation of 5%, iron decreased at 23.  B12, folate, LDH, haptoglobin were within normal limits.  Coombs test negative.   Given persistent iron deficiency anemia, we proceeded with IV iron using Feraheme  weekly x 2 doses.  Labs today revealed improved hemoglobin of 12.8.  White count overall stable at 3400 with normal differential, ANC 1600.  Platelet count normal.  Ferritin within normal limits.  Iron studies previously showed no evidence of iron deficiency.  Repeat iron studies are pending from today.  No indication for IV iron currently.  She was advised to continue oral iron once daily.  She is tolerating this well.   Patient apparently had testing for sickle cell disease, thalassemia in the past when she was living abroad (in [place]) and workup was negative per her verbal report.  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  -RTC in 4 months for repeat labs and follow-up.  Leukopenia Chronic low white blood cell count.   Apparently she had hematological [place].  No known cause identified yet.   On her initial consultation with us  on 06/01/2023, white count was better at 3800 with normal differential.  B12, folate were within normal limits.  ANA negative.  White count is stable at 3400 today.  On recent ER visit in January 2025, white count was normal at 8200, indicating adequate bone marrow response to stressful situations.  Picture consistent with benign neutropenia/leukopenia.   She is asymptomatic from leukopenia.  We can continue monitoring at current levels.  Exertional tachycardia Reports of tachycardia with exertion, heart rates reaching 160-180 bpm. Previous Holter monitoring showed no arrhythmias. Current heart rate is 70 bpm. Etiology of tachycardia is unclear.  Will check TSH on return visit.  I spent a total of  20 minutes during this encounter with the patient including review of chart and various tests results, discussions about plan of care and coordination of care plan.  I reviewed lab results and outside records for this visit and discussed relevant results with the patient. Diagnosis, plan of care and treatment options were also discussed in detail with the patient. Opportunity provided to ask questions and answers provided to her apparent satisfaction. Provided instructions to call our clinic with any problems, questions or concerns prior to return visit. I recommended to continue follow-up with PCP and sub-specialists. She verbalized understanding and agreed with the plan. No barriers to learning was detected.  Morgan Patten, MD  04/10/2024 11:21 AM  [facility] OF [place]  [facility] [address] [place] [address] Dept: [phone] Dept Fax: [phone]   CHIEF COMPLAINT/ REASON FOR VISIT:  Follow-up for iron deficiency anemia and chronic, mild leukopenia.  INTERVAL  HISTORY:  Discussed the use of AI scribe software for clinical note transcription with the patient, who gave verbal consent to proceed.  History of Present Illness Morgan [name] is a 38 year old female with anemia who presents with fatigue and tachycardia.  She experiences fatigue and episodes of tachycardia, particularly during exertion or while busy with her children. Her heart rate has been recorded as high as 160 to 180 beats per minute. Multiple Holter monitor tests have not identified the cause of her symptoms.  She associates her tachycardia with low hemoglobin levels, noting that her heart rate increases significantly during physical activities or even while resting. She is currently taking iron supplements once a day but feels they may not be as effective as they were for her brothers. Her hemoglobin level was 12.2 in June and is 12.8 currently, and her white blood cell count was 3,000 and is now 3,400.  She received two doses of IV iron in November and December of last year, which helped maintain her hemoglobin levels. She does not experience heavy menstrual cycles since removing her IUD, although her cycles are still somewhat heavy.  She is preparing for exams, which she describes as a 'painful process,' and notes that changes in the testing system have caused delays and complications in scheduling her test.   SUMMARY OF HEMATOLOGIC HISTORY:  Labs at her PCPs office on 04/21/2023 showed hemoglobin of 10.8, hematocrit 36.7, MCV 74.  White count was 2900.  Platelet count was normal at 227,000.  Iron saturation was decreased at 7%, ferritin borderline low at 16.  Previously labs in April 2024 showed hemoglobin of 10.9, MCV 70.  White count was 3600 at that time.  Given persistent anemia, referral was sent to us  for further evaluation.   She presents with persistent iron deficiency despite long-term oral iron supplementation. The patient reports experiencing fatigue, shortness of  breath, palpitations, and occasional chest tightness. The patient also mentions having carpal tunnel surgery and experiencing generalized fatigue, myalgia, and bone pain. Despite these symptoms, the patient's hemoglobin levels remain around ten. The patient also reports a history of low white blood cell count. The patient has been on pantoprazole  for over two years due to GERD and cannot tolerate NSAIDs due to severe gastritis. The patient also reports having heavy menstrual cycles.   She had not noticed any recent bleeding such as epistaxis, hematuria or hematochezia.  On her initial consultation with us  on 06/01/2023, labs showed hemoglobin of 11.1, hematocrit 37, MCV 76.3.  White count 3800 with normal differential.  Platelet count normal at 273,000.  Iron studies continued to show evidence of iron deficiency with ferritin of 9, iron saturation of 5%, iron decreased at 23.  B12, folate, LDH, haptoglobin were within normal limits.  Coombs test negative.   Given persistent iron deficiency anemia, we proceeded with IV iron using Feraheme  weekly x 2 doses.   Patient apparently had testing for sickle cell disease, thalassemia in the past when she was living abroad (in [place]) and workup was negative per her verbal report.  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   Chronic low white blood cell count.  Apparently she had hematological workup [place] previously.  No known cause identified yet.   On her initial consultation with us  on  06/01/2023, white count was better at 3800 with normal differential.  B12, folate were within normal limits.  ANA negative.   She is asymptomatic from leukopenia.  We can continue monitoring at current levels.  I have reviewed the past medical history, past surgical history, social history and family history with the patient and they are unchanged from previous note.  ALLERGIES: She is allergic to metronidazole.  MEDICATIONS:  Current Outpatient Medications   Medication Sig Dispense Refill   acetaminophen  (TYLENOL ) 500 MG tablet Take 500-1,000 mg by mouth every 6 (six) hours as needed for moderate pain (pain score 4-6).     baclofen  (LIORESAL ) 10 MG tablet Take 1 tablet (10 mg total) by mouth at bedtime. 90 tablet 3   buPROPion  (WELLBUTRIN  XL) 300 MG 24 hr tablet Take 1 tablet (300 mg total) by mouth daily. 30 tablet 2   cetirizine  (ZYRTEC ) 10 MG tablet Take 1 tablet (10 mg total) by mouth daily. 30 tablet 11   ferrous gluconate  (FERGON) 324 MG tablet Take 1 tablet (324 mg total) by mouth daily with breakfast. 30 tablet 1   fluticasone  (FLONASE ) 50 MCG/ACT nasal spray Place 2 sprays into both nostrils daily. 16 g 6   Multiple Vitamin (MULTIVITAMIN) tablet Take 1 tablet by mouth daily.     ondansetron  (ZOFRAN ) 4 MG tablet Take 1 tablet (4 mg total) by mouth every 8 (eight) hours as needed for nausea or vomiting. 20 tablet 0   pantoprazole  (PROTONIX ) 40 MG tablet Take 1 tablet (40 mg total) by mouth 2 (two) times daily. 180 tablet 0   Phentermine -Topiramate ER (QSYMIA) 3.75-23 MG CP24 1 capsule po qAM 14 capsule 0   Phentermine -Topiramate ER (QSYMIA) 7.5-46 MG CP24 Take 1 capsule by mouth daily. 30 capsule 0   sucralfate  (CARAFATE ) 1 GM/10ML suspension Take 10 mLs (1 g total) by mouth 4 (four) times daily. 420 mL 3   TWIRLA 120-30 MCG/24HR PTWK Apply 1 patch topically once a week.     No current facility-administered medications for this visit.     REVIEW OF SYSTEMS:    Review of Systems - Oncology All other pertinent systems were reviewed with the patient and are negative.  PHYSICAL EXAMINATION:    Onc Performance Status - 04/10/24 1031       ECOG Perf Status   ECOG Perf Status Ambulatory and capable of all selfcare but unable to carry out any work activities.  Up and about more than 50% of waking hours      KPS SCALE   KPS % SCORE Cares for self, unable to carry on normal activity or to do active work          Vitals:   04/10/24  1018  BP: 107/76  Pulse: 70  Resp: 15  Temp: 98.2 F (36.8 C)  SpO2: 100%   Filed Weights   04/10/24 1018  Weight: 149 lb 6.4 oz (67.8 kg)    Physical Exam Constitutional:      General: She is not in acute distress.    Appearance: Normal appearance.  HENT:     Head: Normocephalic and atraumatic.  Cardiovascular:     Rate and Rhythm: Normal rate and regular rhythm.  Pulmonary:     Effort: Pulmonary effort is normal.  Abdominal:     General: There is no distension.  Neurological:     General: No focal deficit present.     Mental Status: She is alert and oriented to person, place, and time.  Psychiatric:  Mood and Affect: Mood normal.        Behavior: Behavior normal.        Thought Content: Thought content normal.     LABORATORY DATA:   I have reviewed the data as listed.  Results for orders placed or performed in visit on 04/10/24  Ferritin  Result Value Ref Range   Ferritin 61 11 - 307 ng/mL  CBC with Differential (Cancer Center Only)  Result Value Ref Range   WBC Count 3.4 (L) 4.0 - 10.5 K/uL   RBC 4.99 3.87 - 5.11 MIL/uL   Hemoglobin 12.8 12.0 - 15.0 g/dL   HCT 60.2 63.9 - 53.9 %   MCV 79.6 (L) 80.0 - 100.0 fL   MCH 25.7 (L) 26.0 - 34.0 pg   MCHC 32.2 30.0 - 36.0 g/dL   RDW 86.9 88.4 - 84.4 %   Platelet Count 233 150 - 400 K/uL   nRBC 0.0 0.0 - 0.2 %   Neutrophils Relative % 45 %   Neutro Abs 1.6 (L) 1.7 - 7.7 K/uL   Lymphocytes Relative 44 %   Lymphs Abs 1.5 0.7 - 4.0 K/uL   Monocytes Relative 6 %   Monocytes Absolute 0.2 0.1 - 1.0 K/uL   Eosinophils Relative 4 %   Eosinophils Absolute 0.1 0.0 - 0.5 K/uL   Basophils Relative 1 %   Basophils Absolute 0.0 0.0 - 0.1 K/uL   Immature Granulocytes 0 %   Abs Immature Granulocytes 0.00 0.00 - 0.07 K/uL    RADIOGRAPHIC STUDIES:  No recent pertinent imaging studies available to review.  Orders Placed This Encounter  Procedures   CBC with Differential (Cancer Center Only)    Standing Status:    Future    Expected Date:   08/11/2024    Expiration Date:   11/09/2024   Iron and TIBC    Standing Status:   Future    Expected Date:   08/11/2024    Expiration Date:   11/09/2024   Ferritin    Standing Status:   Future    Expected Date:   08/11/2024    Expiration Date:   11/09/2024     Future Appointments  Date Time Provider Department Center  04/18/2024  3:20 PM Waylan Darice BRAVO, DO [facility] None  04/25/2024  8:30 AM Sharron Wyatt SQUIBB, PsyD [facility] None  06/07/2024 10:30 AM McGreal, Inocente HERO, MD [facility]  08/14/2024 11:00 AM [facility] PHLEBOTOMIST [facility] None  08/14/2024 11:30 AM Cherrell Maybee, Chinita, MD [facility] None     This document was completed utilizing speech recognition software. Grammatical errors, random word insertions, pronoun errors, and incomplete sentences are an occasional consequence of this system due to software limitations, ambient noise, and hardware issues. Any formal questions or concerns about the content, text or information contained within the body of this dictation should be directly addressed to the provider for clarification.

## 2024-04-18 ENCOUNTER — Ambulatory Visit: Admitting: Family Medicine

## 2024-04-18 ENCOUNTER — Encounter: Payer: Self-pay | Admitting: Family Medicine

## 2024-04-18 VITALS — BP 127/76 | HR 76 | Temp 98.0°F | Ht 60.0 in | Wt 148.0 lb

## 2024-04-18 DIAGNOSIS — E663 Overweight: Secondary | ICD-10-CM

## 2024-04-18 DIAGNOSIS — E559 Vitamin D deficiency, unspecified: Secondary | ICD-10-CM | POA: Diagnosis not present

## 2024-04-18 DIAGNOSIS — K219 Gastro-esophageal reflux disease without esophagitis: Secondary | ICD-10-CM | POA: Diagnosis not present

## 2024-04-18 DIAGNOSIS — D509 Iron deficiency anemia, unspecified: Secondary | ICD-10-CM | POA: Diagnosis not present

## 2024-04-18 DIAGNOSIS — Z6828 Body mass index (BMI) 28.0-28.9, adult: Secondary | ICD-10-CM | POA: Diagnosis not present

## 2024-04-18 DIAGNOSIS — F5089 Other specified eating disorder: Secondary | ICD-10-CM | POA: Diagnosis not present

## 2024-04-18 DIAGNOSIS — F3289 Other specified depressive episodes: Secondary | ICD-10-CM | POA: Diagnosis not present

## 2024-04-18 DIAGNOSIS — Z9884 Bariatric surgery status: Secondary | ICD-10-CM | POA: Diagnosis not present

## 2024-04-18 MED ORDER — PANTOPRAZOLE SODIUM 40 MG PO TBEC: 40.0000 mg | DELAYED_RELEASE_TABLET | Freq: Two times a day (BID) | ORAL | 0 refills | Status: DC

## 2024-04-18 MED ORDER — FERROUS GLUCONATE 324 (38 FE) MG PO TABS: 324.0000 mg | ORAL_TABLET | Freq: Every day | ORAL | 1 refills | Status: DC

## 2024-04-18 MED ORDER — BUPROPION HCL ER (XL) 300 MG PO TB24: 300.0000 mg | ORAL_TABLET | Freq: Every day | ORAL | 2 refills | Status: DC

## 2024-04-18 NOTE — Progress Notes (Signed)
 Office: [phone]  /  Fax: [phone]  WEIGHT SUMMARY AND BIOMETRICS  Starting Date: 10/07/22  Starting Weight: 154lb   Weight Lost Since Last Visit: 0lb   Vitals Temp: 98 F (36.7 C) BP: 127/76 Pulse Rate: 76 SpO2: 100 %   Body Composition  Body Fat %: 42.6 % Fat Mass (lbs): 63.4 lbs Muscle Mass (lbs): 81 lbs Total Body Water (lbs): 70.4 lbs Visceral Fat Rating : 8    HPI  Chief Complaint: OBESITY  Morgan [name] is here to discuss her progress with her obesity treatment plan. She is on the following a lower carbohydrate, vegetable and lean protein rich diet plan and states she is following her eating plan approximately 40-50 % of the time. She states she is exercising 0 minutes 0 times per week.  Interval History:  Since last office visit she is up 3 lb She is down 0.4 lb of muscle mass and up 3.8 lb of body fat since last visit She has not yet started Qsymia due to lag from [facility] She has been bloated this week with menstrual food cravings She has not been doing any exercise She has been back to see oncology for IDA (IV iron not ordered) She is taking oral iron daily She has been busy with her family the past few weeks She has had mood changes due to the [facility] patch She still has a lot of fatigue with poor sleep at night She has been seeing Dr Sharron for CBT  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 Body mass index is 28.9 kg/m.  General: She is healthy appearing, cooperative, alert, well developed, and in no acute distress. PSYCH: Has normal mood, affect and thought process.   Lungs: Normal breathing effort, no conversational dyspnea.   ASSESSMENT AND  PLAN  TREATMENT PLAN FOR OBESITY:  Recommended Dietary Goals  Morgan [name] is currently in the action stage of change. As such, her goal is to continue weight management plan. She has agreed to keeping a food journal and adhering to recommended goals of 1200 calories and 80 g of  protein and following a lower carbohydrate, vegetable and lean protein rich diet plan.  Behavioral Intervention  We discussed the following Behavioral Modification Strategies today: increasing lean protein intake to established goals, increasing fiber rich foods, avoiding skipping meals, increasing water intake , work on meal planning and preparation, work on counselling psychologist calories using tracking application, keeping healthy foods at home, practice mindfulness eating and understand the difference between hunger signals and cravings, avoiding temptations and identifying enticing environmental cues, and planning for success.  Additional resources provided today: NA  Recommended Physical Activity Goals  Morgan [name] has been advised to work up to 150 minutes of moderate intensity aerobic activity a week and strengthening exercises 2-3 times per week for cardiovascular health, weight loss maintenance and preservation of muscle mass.   She has agreed to Think about enjoyable ways to increase daily physical activity and overcoming barriers to exercise and Increase physical activity in their day and reduce sedentary time (increase NEAT).  Pharmacotherapy changes for the treatment of obesity:   ASSOCIATED CONDITIONS ADDRESSED TODAY  Iron deficiency anemia, unspecified iron deficiency anemia  type Lab Results  Component Value Date   IRON 94 04/10/2024   TIBC 470 (H) 04/10/2024   FERRITIN 61 04/10/2024  Seen by Dr. Autumn 04/10/2024, on oral iron daily, tolerating well but still complains of fatigue with fairly heavy menses.  She has a follow-up scheduled to see if she will need IV iron in the future. -     Ferrous  Gluconate; Take 1 tablet (324 mg total) by mouth daily with breakfast.  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  She has tried weaning down and had severe dyspepsia.  She is scheduled for follow-up with Dr. Suzann on 06/07/2024 to repeat H. pylori testing.  She completed treatment for H. pylori in the summer.  She is aware of potential negative side effects from long-term PPI use. -     Pantoprazole  Sodium; Take 1 tablet (40 mg total) by mouth 2 (two) times daily.  Dispense: 180 tablet; Refill: 0  Other depression with emotional eating Mood has improved on Wellbutrin  XL 300 mg once daily but she has been sleeping poorly lately.  Stress levels remain high.  She denies adverse side effects from medication and has been seeing Dr. Sharron for CBT. -     buPROPion  HCl ER (XL); Take 1 tablet (300 mg total) by mouth daily.  Dispense: 30 tablet; Refill: 2  Overweight (BMI 25.0-29.9) Stable Goal is to keep body weight around 140 pounds for maintenance Call if any problems or questions with new start Qsymia Patient was counseled on the importance of maintaining healthy lifestyle habits, including balanced nutrition, regular physical activity, and behavioral modifications, while taking antiobesity medication.  Patient verbalized understanding that medication is an adjunct to, not a replacement for, lifestyle changes and that the long-term success and weight maintenance depend on continued adherence to these strategies.  S/P laparoscopic sleeve gastrectomy Continue a bariatric multivitamin once daily Work on lean protein intake with meals and snacks, volume control at mealtime and hydrating well with water outside of mealtime She has room for more regular exercise  BMI 28.0-28.9,adult  Vitamin D  deficiency She is currently on vitamin D  2000 IU  once daily.  Repeat level in December.  She notably cannot take prescription vitamin D  due to gelatin component.    She was informed of the importance of frequent follow up visits to maximize her success with intensive lifestyle modifications for her multiple health conditions.   ATTESTASTION STATEMENTS:  Reviewed by clinician on day of visit: allergies, medications, problem list, medical history, surgical history, family history, social history, and previous encounter notes pertinent to obesity diagnosis.   I have personally spent 31 minutes total time today in preparation, patient care, nutritional counseling and education,  and documentation for this visit, including the following: review of most recent clinical lab tests, prescribing medications/ refilling medications, reviewing medical assistant documentation, review and interpretation of bioimpedence results.     Morgan [name], D.O. DABFM, DABOM [facility] [address] [place], [place] [address] [phone]

## 2024-04-19 ENCOUNTER — Telehealth: Payer: Self-pay | Admitting: *Deleted

## 2024-04-19 NOTE — Telephone Encounter (Signed)
-----   Message from Darice FORBES Haddock sent at 04/18/2024  4:13 PM EDT ----- Regarding: BELT program Luke, We are going to get Cambry into the BELT program.  This is group exercise for patients who have had bariatric surgery.   Can you email Dr Deward Moats : [email or web address] and send her information?  Thank you! If there is a better way to get patients enrolled, please let me know.  Dr Haddock

## 2024-04-19 NOTE — Telephone Encounter (Signed)
 Office notes emailed to [email or web address]. SO patient can join BELT program.

## 2024-04-25 ENCOUNTER — Encounter (INDEPENDENT_AMBULATORY_CARE_PROVIDER_SITE_OTHER): Payer: Self-pay | Admitting: Psychology

## 2024-04-25 ENCOUNTER — Encounter (INDEPENDENT_AMBULATORY_CARE_PROVIDER_SITE_OTHER): Payer: Self-pay

## 2024-04-25 ENCOUNTER — Telehealth (INDEPENDENT_AMBULATORY_CARE_PROVIDER_SITE_OTHER): Payer: Self-pay | Admitting: Psychology

## 2024-04-25 NOTE — Telephone Encounter (Signed)
  Office: [phone]  /  Fax: [phone]  Date of Encounter: April 25, 2024  Time of Encounter: 8:36am Duration of Encounter: ~7.5 minute(s) Provider: Wyatt Fire, PsyD  CONTENT: Morgan [name] presented for today's appointment via MyChart Video Visit. Due to technical issues, this provider called Morgan [name] at 8:40am and the issues were resolved. A brief check-in was conducted. Morgan [name] shared she learned at 7am today that her uncle passed away in [place]. As such, she requested to reschedule today's appointment. No evidence or endorsement of safety concerns. All questions/concerns addressed.   PLAN: Morgan [name] is scheduled for an appointment on 05/02/2024 at 12pm via MyChart Video Visit.

## 2024-04-25 NOTE — Progress Notes (Signed)
 Entered in error

## 2024-05-02 ENCOUNTER — Telehealth (INDEPENDENT_AMBULATORY_CARE_PROVIDER_SITE_OTHER): Admitting: Psychology

## 2024-05-02 DIAGNOSIS — F419 Anxiety disorder, unspecified: Secondary | ICD-10-CM

## 2024-05-02 DIAGNOSIS — F32A Depression, unspecified: Secondary | ICD-10-CM

## 2024-05-02 DIAGNOSIS — F5089 Other specified eating disorder: Secondary | ICD-10-CM

## 2024-05-02 NOTE — Progress Notes (Signed)
  Office: [phone]  /  Fax: [phone]    Date: May 02, 2024  Appointment Start Time: 12:12pm Duration: 31 minutes Provider: Wyatt Fire, Psy.D. Type of Session: Individual Therapy  Location of Patient: Home (private location) Location of Provider: [facility] clinic at [facility] Type of Contact: Telepsychological Visit via MyChart Video Visit  Session Content: Morgan [name] is a 38 y.o. female presenting for a follow-up appointment to address the previously established treatment goal of increasing coping skills.Today's appointment was a telepsychological visit. Morgan [name] provided verbal consent for today's telepsychological appointment and she is aware she is responsible for securing confidentiality on her end of the session. Prior to proceeding with today's appointment, Morgan [name]'s physical location at the time of this appointment was obtained as well a phone number she could be reached at in the event of technical difficulties. Morgan [name] and this provider participated in today's telepsychological service.   This provider conducted a brief check-in. Morgan [name] shared she is still sad regarding her uncle's passing. She further shared since starting Qysmia, she is not the same. Further explored and processed. She described feeling less focused and experiencing frequent wake ups. She was encouraged to contact her prescribing provider today. Discussed the possibility of grief playing a role with the abovementioned concerns. Remainder of today's appointment focused on grief and processing. Of note, this provider shared she will be leaving the practice at the end of this year. Morgan [name] thoughts/feelings were explored and processed. She noted she was already informed during her last OV with Dr. Waylan. Overall, Morgan [name] was receptive to today's appointment as evidenced by openness to sharing, responsiveness to feedback, and willingness to continue engaging in learned skills.  Mental Status Examination:  Appearance:  neat Behavior: appropriate to circumstances Mood: sad Affect: mood congruent Speech: WNL Eye Contact: intermittent Psychomotor Activity: WNL Gait: unable to assess Thought Process: linear, logical, and goal directed and no evidence or endorsement of suicidal, homicidal, and self-harm ideation, plan and intent  Thought Content/Perception: no hallucinations, delusions, bizarre thinking or behavior endorsed or observed Orientation: AAOx4 Memory/Concentration: intact Insight: fair Judgment: fair  Interventions:  Conducted a brief chart review Provided empathic reflections and validation Provided positive reinforcement Employed supportive psychotherapy interventions to facilitate reduced distress and to improve coping skills with identified stressors Discussed termination Psychoeducation provided regarding the grieving process  DSM-5 Diagnosis(es): F50.89 Other Specified Feeding or Eating Disorder, Emotional Eating Behaviors, F41.9 Unspecified Anxiety Disorder, and F32.A Unspecified Depressive Disorder  Treatment Goal & Progress: During the initial appointment with this provider, the following treatment goal was established: increase coping skills. Morgan [name] has demonstrated progress in her goal as evidenced by increased awareness of hunger patterns and increased awareness of triggers for emotional eating behaviors. Morgan [name] also continues to demonstrate willingness to engage in learned skill(s).   Plan: The next appointment is scheduled for 05/16/2024 at 11:30am, which will be via MyChart Video Visit. The next session will focus on working towards the established treatment goal. Morgan [name] was referred by Dr. Waylan to [facility] for therapeutic services; however, Morgan [name] expressed desire to continue with this provider at [facility]. This provider will discuss this further at the next appointment.    Wyatt Fire, PsyD

## 2024-05-02 NOTE — Addendum Note (Signed)
 Addended by: SHARRON WYATT SQUIBB on: 05/02/2024 02:08 PM   Modules accepted: Orders

## 2024-05-11 ENCOUNTER — Encounter: Payer: Self-pay | Admitting: Family Medicine

## 2024-05-11 ENCOUNTER — Ambulatory Visit: Admitting: Family Medicine

## 2024-05-11 VITALS — BP 108/82 | HR 60 | Temp 97.5°F | Ht 60.0 in | Wt 139.0 lb

## 2024-05-11 DIAGNOSIS — G4700 Insomnia, unspecified: Secondary | ICD-10-CM | POA: Diagnosis not present

## 2024-05-11 DIAGNOSIS — F3289 Other specified depressive episodes: Secondary | ICD-10-CM | POA: Diagnosis not present

## 2024-05-11 DIAGNOSIS — F5089 Other specified eating disorder: Secondary | ICD-10-CM | POA: Diagnosis not present

## 2024-05-11 DIAGNOSIS — Z9884 Bariatric surgery status: Secondary | ICD-10-CM | POA: Diagnosis not present

## 2024-05-11 DIAGNOSIS — K219 Gastro-esophageal reflux disease without esophagitis: Secondary | ICD-10-CM | POA: Diagnosis not present

## 2024-05-11 DIAGNOSIS — E663 Overweight: Secondary | ICD-10-CM | POA: Diagnosis not present

## 2024-05-11 DIAGNOSIS — T505X5A Adverse effect of appetite depressants, initial encounter: Secondary | ICD-10-CM

## 2024-05-11 DIAGNOSIS — T887XXA Unspecified adverse effect of drug or medicament, initial encounter: Secondary | ICD-10-CM

## 2024-05-11 DIAGNOSIS — R002 Palpitations: Secondary | ICD-10-CM | POA: Diagnosis not present

## 2024-05-11 DIAGNOSIS — R110 Nausea: Secondary | ICD-10-CM

## 2024-05-11 DIAGNOSIS — Z6827 Body mass index (BMI) 27.0-27.9, adult: Secondary | ICD-10-CM

## 2024-05-11 MED ORDER — PHENTERMINE-TOPIRAMATE ER 7.5-46 MG PO CP24: ORAL_CAPSULE | ORAL | Status: DC

## 2024-05-11 NOTE — Progress Notes (Signed)
 Office: [phone]  /  Fax: [phone]  WEIGHT SUMMARY AND BIOMETRICS  Starting Date: 10/07/22  Starting Weight: 154lb   Weight Lost Since Last Visit: 9lb   Vitals Temp: (!) 97.5 F (36.4 C) BP: 108/82 Pulse Rate: 60 SpO2: 100 %   Body Composition  Body Fat %: 39.6 % Fat Mass (lbs): 55.2 lbs Muscle Mass (lbs): 80 lbs Total Body Water (lbs): 65.6 lbs Visceral Fat Rating : 6   HPI  Chief Complaint: OBESITY  Morgan [name] is here to discuss her progress with her obesity treatment plan. She is on the following a lower carbohydrate, vegetable and lean protein rich diet plan and states she is following her eating plan approximately 70 % of the time. She states she is exercising 0 minutes 0 times per week.  Interval History:  Since last office visit she is down 9 lb She is down 1 lb of muscle mass and down 8.2 lb of body fat She has a net weight loss of 15 lb in 1.5 years of medically supervised weight management She started Qsymia and did move up on Qsymia to 7.5/46 mg daily and has had a lot of SE including heart palpitations, insomnia and nausea She has had more heartburn She has been skipping meals, eating some candy and is not doing any regular exercise  Pharmacotherapy: Qsymia 7.5/46 mg daily  PHYSICAL EXAM:  Blood pressure 108/82, pulse 60, temperature (!) 97.5 F (36.4 C), height 5' (1.524 m), weight 139 lb (63 kg), SpO2 100%. Body mass index is 27.15 kg/m.  General: She is healthy appearing,  cooperative, alert, well developed, and in no acute distress. PSYCH: Has normal mood, affect and thought process.   Lungs: Normal breathing effort, no conversational dyspnea.  ASSESSMENT AND PLAN  TREATMENT PLAN FOR OBESITY:  Recommended Dietary Goals  Morgan [name] is currently in the action stage of change. As such, her goal is to continue weight management plan. She has agreed to the Category 2 Plan.  Behavioral Intervention  We discussed the following Behavioral  Modification Strategies today: increasing lean protein intake to established goals, avoiding skipping meals, increasing water intake , work on tracking and journaling calories using tracking application, work on managing stress, creating time for self-care and relaxation, and continue to practice mindfulness when eating.  Additional resources provided today: NA  Recommended Physical Activity Goals  Morgan [name] has been advised to work up to 150 minutes of moderate intensity aerobic activity a week and strengthening exercises 2-3 times per week for cardiovascular health, weight loss maintenance and preservation of muscle mass.   She has agreed to Think about enjoyable ways to increase daily physical activity and overcoming barriers to exercise and Increase physical activity in their day and reduce sedentary time (increase NEAT).  Pharmacotherapy changes for the treatment of obesity: move Qsymia 7.5/46 mg to every other day  ASSOCIATED CONDITIONS ADDRESSED TODAY  Medication side effect Discussed current issues with Qsymia side effects She agrees to moving Qsymia 7.5/26 mg to every other day and will notify me if side effects do not improve over the next 1 to 2 weeks. Blood pressure and heart rate are within normal limits Encouraged small meals, hydrating well with water and lean protein intake  Other depression with emotional eating Worsening.  Stress levels have been high.  She is in counseling. Look for improving anxiety levels with reducing Qsymia frequency.  Overweight (BMI 25.0-29.9) -     Phentermine -Topiramate ER; 1 capsule po every other day, in the morning  Gastroesophageal reflux disease, unspecified whether esophagitis present -     Phentermine -Topiramate ER; 1 capsule po every other day, in the morning  BMI 27.0-27.9,adult  S/P laparoscopic sleeve gastrectomy Improving and almost at nadir weight of 136 pounds status post vertical sleeve gastrectomy done in 2022 in [place].   She is taking a bariatric multivitamin daily, daily PPI for chronic heartburn and oral iron per hematology.     She was informed of the importance of frequent follow up visits to maximize her success with intensive lifestyle modifications for her multiple health conditions.   ATTESTASTION STATEMENTS:  Reviewed by clinician on day of visit: allergies, medications, problem list, medical history, surgical history, family history, social history, and previous encounter notes pertinent to obesity diagnosis.   I have personally spent 30 minutes total time today in preparation, patient care, nutritional counseling and education,  and documentation for this visit, including the following: review of most recent clinical lab tests, prescribing medications/ refilling medications, reviewing medical assistant documentation, review and interpretation of bioimpedence results.     Morgan [name], D.O. DABFM, DABOM [facility] [address] [place], [place] [address] [phone]

## 2024-05-16 ENCOUNTER — Telehealth (INDEPENDENT_AMBULATORY_CARE_PROVIDER_SITE_OTHER): Admitting: Psychology

## 2024-05-16 ENCOUNTER — Telehealth (INDEPENDENT_AMBULATORY_CARE_PROVIDER_SITE_OTHER): Payer: Self-pay | Admitting: Psychology

## 2024-05-16 NOTE — Telephone Encounter (Signed)
  Office: [phone]  /  Fax: [phone]  Date of Encounter: May 16, 2024  Time of Encounter: 11:32am Duration of Encounter: 2 minute(s) Provider: Wyatt Fire, PsyD  CONTENT: Zoraida presented for today's appointment via MyChart Video Visit. She shared she was parked outside her son's school as the school called to inform her that her son is sick and needs to be picked up. Janny requested to reschedule if possible. No evidence or endorsement of safety concerns. All questions/concerns addressed.   PLAN: Stamatia is scheduled for an appointment on 05/22/2024 at 9:30am.

## 2024-05-16 NOTE — Progress Notes (Unsigned)
  Office: [phone]  /  Fax: [phone]    Date: May 16, 2024  Appointment Start Time: *** Duration: *** minutes Provider: Wyatt Fire, Psy.D. Type of Session: Individual Therapy  Location of Patient: {gbptloc:23249} (private location) Location of Provider: Provider's Home (private office) Type of Contact: Telepsychological Visit via MyChart Video Visit  Session Content: Morgan [name] is a 38 y.o. female presenting for a follow-up appointment to address the previously established treatment goal of increasing coping skills.Today's appointment was a telepsychological visit. Sharnee provided verbal consent for today's telepsychological appointment and she is aware she is responsible for securing confidentiality on her end of the session. Prior to proceeding with today's appointment, Shania's physical location at the time of this appointment was obtained as well a phone number she could be reached at in the event of technical difficulties. [name] and this provider participated in today's telepsychological service.   This provider conducted a brief check-in. *** [name] was receptive to today's appointment as evidenced by openness to sharing, responsiveness to feedback, and {gbreceptiveness:23401}.  Mental Status Examination:  Appearance: {Appearance:22431} Behavior: {Behavior:22445} Mood: {gbmood:21757} Affect: {Affect:22436} Speech: {Speech:22432} Eye Contact: {Eye Contact:22433} Psychomotor Activity: {Motor Activity:22434} Gait: {gbgait:23404} Thought Process: {thought process:22448}  Thought Content/Perception: {disturbances:22451} Orientation: {Orientation:22437} Memory/Concentration: {gbcognition:22449} Insight: {Insight:22446} Judgment: {Insight:22446}  Interventions:  {Interventions for Progress Notes:23405}  DSM-5 Diagnosis(es): {Diagnoses:22752}  Treatment Goal & Progress: During the initial appointment with this provider, the following treatment goal was established:  increase coping skills. [name] has demonstrated progress in her goal as evidenced by {gbtxprogress:22839}. [name] also {gbtxprogress2:22951}.  Plan: The next appointment is scheduled for *** at ***, which will be via MyChart Video Visit. The next session will focus on {Plan for Next Appointment:23400}.   Wyatt Fire, PsyD

## 2024-05-22 ENCOUNTER — Telehealth (INDEPENDENT_AMBULATORY_CARE_PROVIDER_SITE_OTHER): Admitting: Psychology

## 2024-05-22 DIAGNOSIS — F419 Anxiety disorder, unspecified: Secondary | ICD-10-CM | POA: Diagnosis not present

## 2024-05-22 DIAGNOSIS — F5089 Other specified eating disorder: Secondary | ICD-10-CM

## 2024-05-22 DIAGNOSIS — F32A Depression, unspecified: Secondary | ICD-10-CM | POA: Diagnosis not present

## 2024-05-22 NOTE — Progress Notes (Addendum)
  Office: [phone]  /  Fax: [phone]    Date: May 22, 2024  Appointment Start Time: 8:31am Duration: 22 minutes Provider: Wyatt Fire, Psy.D. Type of Session: Individual Therapy  Location of Patient: Parked in car ( address obtained; private location) Location of Provider: Provider's Home (private office) Type of Contact: Telepsychological Visit via MyChart Video Visit  Session Content: Morgan [name] is a 38 y.o. female presenting for a follow-up appointment to address the previously established treatment goal of increasing coping skills. Today's appointment was a telepsychological visit. Morgan [name] provided verbal consent for today's telepsychological appointment and she is aware she is responsible for securing confidentiality on her end of the session. Prior to proceeding with today's appointment, Morgan [name]'s physical location at the time of this appointment was obtained as well a phone number she could be reached at in the event of technical difficulties. Morgan [name] and this provider participated in today's telepsychological service.   This provider conducted a brief check-in. Morgan [name] shared a reduction in side effects from Qysmia, noting I feel the insomnia and anxiety are better. However, she indicated she is still frequently anxious and others have commented on noticing a sad look in [her] eyes. Further explored and processed. Various ongoing stressors were identified. This provider explored recent engagement in self-care. Psychoeducation provided regarding faux self-care and self-care that provides fulfillment. She identified that setting boundaries, especially with her husband, is difficult resulting in her feeling often burnout and overwhelmed.  She acknowledged the aforementioned makes it challenging for her to consistently focus on her well-being goals. As such, for homework, Morgan [name] was encouraged to identify her needs and write them down to discuss further at the next appointment. Overall,   Morgan [name] was receptive to today's appointment as evidenced by openness to sharing, responsiveness to feedback, and willingness to implement discussed strategies .  Mental Status Examination:  Appearance: neat Behavior: appropriate to circumstances Mood: anxious Affect: mood congruent; tearful at times Speech: WNL Eye Contact: appropriate Psychomotor Activity: WNL Gait: unable to assess Thought Process: linear, logical, and goal directed and no evidence or endorsement of suicidal, homicidal, and self-harm ideation, plan and intent  Thought Content/Perception: no hallucinations, delusions, bizarre thinking or behavior endorsed or observed Orientation: AAOx4 Memory/Concentration: intact Insight: good Judgment: good  Interventions:  Conducted a brief chart review Provided empathic reflections and validation Provided positive reinforcement Employed supportive psychotherapy interventions to facilitate reduced distress and to improve coping skills with identified stressors Engaged patient in problem solving Psychoeducation provided regarding self-care  DSM-5 Diagnosis(es): F50.89 Other Specified Feeding or Eating Disorder, Emotional Eating Behaviors, F41.9 Unspecified Anxiety Disorder, and F32.A Unspecified Depressive Disorder  Treatment Goal & Progress: During the initial appointment with this provider, the following treatment goal was established: increase coping skills. Morgan [name] has demonstrated progress in her goal as evidenced by increased awareness of hunger patterns and increased awareness of triggers for emotional eating behaviors. Morgan [name] also continues to demonstrate willingness to engage in learned skill(s).   Plan: The next appointment is scheduled for 05/30/2024 at 11am, which will be via MyChart Video Visit. The next session will focus on working towards the established treatment goal. Morgan [name] is waiting to receive paperwork via physical mail from [facility] to complete and return.    Wyatt Fire, PsyD

## 2024-05-30 ENCOUNTER — Telehealth (INDEPENDENT_AMBULATORY_CARE_PROVIDER_SITE_OTHER): Admitting: Psychology

## 2024-05-30 DIAGNOSIS — F5089 Other specified eating disorder: Secondary | ICD-10-CM | POA: Diagnosis not present

## 2024-05-30 DIAGNOSIS — F419 Anxiety disorder, unspecified: Secondary | ICD-10-CM

## 2024-05-30 DIAGNOSIS — F32A Depression, unspecified: Secondary | ICD-10-CM

## 2024-05-30 NOTE — Progress Notes (Signed)
  Office: [phone]  /  Fax: [phone]    Date: May 30, 2024  Appointment Start Time: 11:07am Duration: 35 minutes Provider: Wyatt Fire, Psy.D. Type of Session: Individual Therapy  Location of Patient: Home (private location) Location of Provider: Provider's Home (private office) Type of Contact: Telepsychological Visit via MyChart Video Visit  Session Content: This provider called [name] at 11:04am as she did not present for today's appointment. She requested an additional two minutes to join the appointment as she was driving. [name] was observed joining shortly after. As such, today's appointment was initiated 7 minutes late.Morgan [name] is a 38 y.o. female presenting for a follow-up appointment to address the previously established treatment goal of increasing coping skills.Today's appointment was a telepsychological visit. Katrinia provided verbal consent for today's telepsychological appointment and she is aware she is responsible for securing confidentiality on her end of the session. Prior to proceeding with today's appointment, [name]'s physical location at the time of this appointment was obtained as well a phone number she could be reached at in the event of technical difficulties. [name] and this provider participated in today's telepsychological service.   This provider conducted a brief check-in. [name] shared she was late today as she had to drop her nephew at school. She further shared, the past week was uneventful. Reviewed content from last appointment. Due to being busy with her children, she disclosed she did not have the opportunity to complete the homework. Thus, today's appointment focused on identifying her needs to improve self-care and overall well-being. She noted difficulty identifying her needs. As such, the miracle question was utilized. Discussed what she currently uses to remember appointments, obligations, responsibilities, etc. 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 Further discussed how the abovementioned can help with reducing engagement in emotional eating behaviors. Overall, [name] was receptive to today's appointment as evidenced by openness to sharing, responsiveness to feedback, and willingness to implement discussed strategies .  Mental Status Examination:  Appearance: neat Behavior: appropriate to circumstances Mood: anxious Affect: mood congruent Speech: WNL Eye Contact: appropriate Psychomotor Activity: WNL Gait: unable to assess Thought Process: linear, logical, and goal directed and no evidence or endorsement of suicidal, homicidal, and self-harm ideation, plan and intent  Thought Content/Perception: no hallucinations, delusions, bizarre thinking or behavior endorsed or observed Orientation: AAOx4 Memory/Concentration: intact Insight: good Judgment: good  Interventions:  Conducted a brief chart review Provided empathic reflections and validation Provided positive reinforcement Employed supportive psychotherapy interventions to facilitate reduced distress and to improve coping skills with identified stressors Engaged patient in problem solving Utilized miracle question  DSM-5 Diagnosis(es): F50.89 Other Specified Feeding or Eating Disorder, Emotional Eating Behaviors, F41.9 Unspecified Anxiety Disorder, and F32.A Unspecified Depressive Disorder  Treatment Goal & Progress: During the initial appointment with this provider, the following treatment goal was established: increase coping skills. Keyonta demonstrated progress in her goal as evidenced by increased awareness of hunger patterns and increased awareness of triggers for emotional eating behaviors. [name] also continues to demonstrate willingness to engage in learned skill(s).   Plan: Today was the last appointment with this provider due to this provider's departure from the practice. [name] is aware and will  be re-establishing care with this provider in the new year at [facility].    Wyatt Fire, PsyD

## 2024-06-06 MED ORDER — PHENTERMINE-TOPIRAMATE ER 7.5-46 MG PO CP24: ORAL_CAPSULE | ORAL | 0 refills | Status: DC

## 2024-06-06 NOTE — Progress Notes (Unsigned)
 [facility] Return Visit   Referring Provider Howell Lunger, DO [address] [place],  [place] [address]  Primary Care Provider Howell Lunger, DO  Patient Profile: Chaniyah Jahr is a 38 y.o. female with a past medical history noteworthy for obesity status post laparoscopic sleeve gastrectomy, vitamin D  deficiency, carpal tunnel syndrome who returns to the [facility] at the request of Dr. Howell for follow-up of the problem(s) listed below: Problem List: Iron deficiency anemia GERD H. Pylori infection x 3; most recently diagnosed by biopsy 07/2023  History of IBS History of hepatitis B   History of Present Illness     Discussed the use of AI scribe software for clinical note transcription with the patient, who gave verbal consent to proceed.  History of Present Illness Suzana is a 38 year old female who returns to the gastroenterology office for follow-up of GERD, H. pylori gastritis, IBS  Current GI Meds  Pantoprazole  40 mg p.o. twice daily Baclofen  10 mg p.o. nightly  Interval History   GERD/H pylori gastritis -- At last visit reported longstanding history of GERD dating back to teenage years -- GERD has been managed over the years with PPIs and famotidine -- In 01/2021 she underwent bariatric surgery with sleeve gastrectomy in [place] -reflux worsened after her bariatric surgery  -- Reported worsening GERD status post pneumonia in fall 2024 -- Increased pantoprazole  to 80 mg p.o. twice daily of her own volition just prior to her last visit -- At last visit I prescribed baclofen  10 mg p.o. nightly which she has found very helpful  -- Reports being diagnosed with H. pylori infection twice in the past -- First treatment regimen -contained metronidazole and she developed symptoms of hypotension and syncope -- Second treatment regimen consisted of amoxicillin  and clarithromycin  -- EGD 08/19/2023 -endoscopically normal, small HH -Path  showed chronic active H. pylori associated gastritis and chronic peptic duodenitis -- Tx'd with rifabutin , amoxicillin , pantoprazole   -- States she is approximately halfway through her H. pylori treatment with rifabutin , amoxicillin  and pantoprazole -delay in treatment due to Ramadan -- Notes that she was starting to feel better around the time of her EGD with the addition of baclofen  -- She had returned to taking pantoprazole  40 mg p.o. twice daily -- Notes worsening symptoms of heartburn and abdominal pain on amoxicillin  and rifabutin -she is hoping this will improve after completing treatment -- In addition to H. pylori antibiotics she is taking pantoprazole  80 mg p.o. every morning, 40 mg p.o. afternoon and 40 mg p.o. every afternoon -- Discussed coordinating a test of cure 4 to 8 weeks after treatment -- Her mother and sister were diagnosed with H. pylori infection many years ago but this resolved after 1 course of treatment  IDA -- Safia also has a longstanding history of anemia that she states dates back to her adolescence/early 15s -- Denies melena, hematochezia, hematemesis -- No menorrhagia-has a copper IUD  -- Labs 09/06/2023 -improved hemoglobin and iron studies -- WBC 3.5, Hgb 13.2, HCT 40.7, platelets 256 -- Iron 37, TIBC 294, ferritin133  -- Has received iron infusions   -- Colonoscopy 08/19/2023 - normal colon and TI - repeat age 89 years -- CTAP 07/2023 - normal  -- Noteworthy that she has had mild leukopenia as well and states that her hematologist queries if there may be a cellular/bone marrow component to her anemia and intermittent leukopenia  HBV -- Records indicate that Shavonte tested positive for hepatitis B in the past -- She relates that  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 pylori associated gastritis and chronic peptic duodenitis  Last Abd CT/CTE/MRE: CTAP 07/2023 - normal  GI Review of Symptoms Significant for GERD. Otherwise negative.  General Review of Systems  Review of systems is significant for the pertinent positives and negatives as listed per the HPI.  Full ROS is otherwise negative.  Past Medical History   Past Medical History:  Diagnosis Date   Anemia    Back pain    Complication of anesthesia    Patient states when given the epidural the numbness went up to her nose.    Edema    GERD (gastroesophageal reflux disease)    H. pylori infection 07/2023   treated with rifabutin , amoxacillin, PPI   Heartburn    Hepatitis    Hepatitis B carrier (HCC)    IBS (irritable bowel syndrome)    Obesity    Osteoarthritis    Palpitations    Pneumonia    Vitamin D  deficiency      Past Surgical History   Past Surgical History:  Procedure Laterality Date   CARPAL TUNNEL RELEASE     gastric balloon  01/28/2021   ocular muscle corrective surgery     SLEEVE GASTROPLASTY  2022     Allergies and Medications   Allergies  Allergen Reactions   Metronidazole Other (See Comments)    Severe hypotension    No outpatient medications have been marked as taking for the 06/07/24 encounter (Appointment) with Suzann Inocente HERO, MD.     Family History   Family History  Problem Relation Age of Onset   Hypertension Mother    Irritable bowel syndrome Mother    Hyperthyroidism Mother    Asthma Mother    Hypertension Father    Hyperlipidemia Father    Irritable bowel syndrome Father    Kidney disease Sister    Hyperthyroidism Sister    Asthma Sister    Asthma Maternal Grandmother     Asthma Son    Alcohol abuse Neg Hx    Arthritis Neg Hx    Birth defects Neg Hx    Cancer Neg Hx    COPD Neg Hx    Depression Neg Hx    Diabetes Neg Hx    Drug abuse Neg Hx    Early death Neg Hx    Hearing loss Neg Hx    Heart disease Neg Hx    Learning disabilities Neg Hx    Mental illness Neg Hx    Mental retardation Neg Hx    Miscarriages / Stillbirths Neg Hx    Stroke Neg Hx    Vision loss Neg Hx    Rectal cancer Neg Hx      Social History   Social History   Tobacco Use   Smoking status: Never    Passive exposure: Never   Smokeless tobacco: Never  Vaping Use   Vaping status: Never Used  Substance Use Topics   Alcohol use: Never   Drug use: Never  Kalii reports that she has never smoked. She has never been exposed to tobacco smoke. She has never used smokeless tobacco. She reports that she does not drink alcohol and does not use drugs.  Vital Signs and Physical Examination   There were no vitals filed for this visit.    General: Well developed, well nourished, no acute distress Head: Normocephalic and atraumatic Eyes: Sclerae anicteric, EOMI Abdomen: Soft, mild tenderness to palpation in the LUQ, non distended. No masses, hepatosplenomegaly or hernias noted. Normal Bowel sounds Rectal: Deferred Musculoskeletal: Symmetrical with no gross deformities   Review of Data  The following data was reviewed at the time of this encounter:  Laboratory Studies      Latest Ref Rng & Units 04/10/2024   10:08 AM 12/07/2023   10:10 AM 09/06/2023   10:35 AM  CBC  WBC 4.0 - 10.5 K/uL 3.4  3.0  3.5   Hemoglobin 12.0 - 15.0 g/dL 87.1  87.7  86.7   Hematocrit 36.0 - 46.0 % 39.7  38.2  40.7   Platelets 150 - 400 K/uL 233  238  256     Lab Results  Component Value Date   LIPASE 23 06/29/2023      Latest Ref Rng & Units 06/29/2023    4:30 PM 04/21/2023   10:40 AM 10/07/2022    8:11 AM  CMP  Glucose 70 - 99 mg/dL 94  73  80   BUN 6 - 20 mg/dL 11  9  13     Creatinine 0.44 - 1.00 mg/dL 9.27  9.38  9.32   Sodium 135 - 145 mmol/L 138  140  137   Potassium 3.5 - 5.1 mmol/L 4.0  4.5  4.6   Chloride 98 - 111 mmol/L 102  103  100   CO2 22 - 32 mmol/L 24  23  19    Calcium 8.9 - 10.3 mg/dL 9.3  9.5  9.6   Total Protein 6.5 - 8.1 g/dL 7.2  6.5  7.3   Total Bilirubin 0.0 - 1.2 mg/dL 1.5  0.3  0.3   Alkaline Phos 38 - 126 U/L 94  61  69   AST 15 - 41 U/L 15  10  13    ALT 0 - 44 U/L 13  11  12     Lab Results  Component Value Date   IRON 94 04/10/2024   TIBC 470 (H) 04/10/2024   FERRITIN 61 04/10/2024   [facility] H. pylori stool antigen 12/2023 negative  Imaging Studies  CTAP 07/30/2023 No acute abdominal or pelvic pathology.  No adenopathy.    CT angio chest 06/30/2023 1. Multifocal pneumonia most prominent along the right upper lobe. 2. Question soft tissue density of the mediastinum that could represent conglomerative lymphadenopathy with limited evaluation due to timing of contrast. Recommend attention on follow-up. 3. Splenomegaly. 4. Question upper abdominal lymphadenopathy. Recommend CT abdomen pelvis with intravenous contrast for further evaluation.   GI Procedures and Studies  EGD/Colonoscopy 07/2023 EGD -gastric sleeve, endoscopically normal, H&H Colonoscopy - normal colon and TI Path: H. pylori chronic gastritis and peptic duodenitis  Patient reports having an EGD in 2022 that showed minor erythema  Clinical Impression  It is my clinical impression that Ms. Korrie Hofbauer is a 38 y.o. female with;  Iron deficiency anemia GERD H.pylori infection x 3; most recently diagnosed by biopsy to 2025 History of IBS History of hepatitis B   Pearlie returns to the office today for follow-up of GERD and H. pylori  infection.  She was initially seen in the office 06/2023 endorsing worsening symptoms of GERD and epigastric abdominal pain.  She had increased her pantoprazole  from 40 mg p.o. twice daily to 80 mg p.o. twice daily.  In response to  her symptoms I added baclofen  10 mg p.o. nightly and scheduled her for an upper endoscopy which yielded evidence of H. pylori gastritis.  Cheridan  reports that she has been diagnosed with H. pylori 3 times.  The first course of treatment consisted of metronidazole to which she had a reaction involving syncope and hypotension.  Her second course of treatment included amoxicillin  and clarithromycin.  She is now on her third course of treatment consisting of amoxicillin  and rifabutin .  She states that her symptoms had been improving with the addition of baclofen  and she has noted exacerbation since embarking on her antibiotics.  We discussed completing her course of antibiotics and performing a test of cure 4 to 8 weeks after completing treatment..  Once she has completed H. pylori treatment she can resume pantoprazole  40 mg p.o. twice daily in conjunction with baclofen .  Reviewed that there is further dose optimization of baclofen  that can occur up to a maximum dose of 20 mg p.o. 3 times daily.  In terms of her iron deficiency anemia, EGD disclosed evidence of H. pylori gastritis which could be contributing to anemia.  Duodenal biopsies were negative for celiac disease -only had evidence of peptic duodenitis.  No findings on colonoscopy to account for anemia.  CTAP was also reassuring.  Her iron parameters and hemoglobin are improved.  She is continuing on ferrous gluconate  and is being followed by hematology.  She notes that she has also had intermittent mild leukopenia and her hematologist is queried if there may be an issue with her cell lines given both leukopenia and anemia.  Janaya has a previous history of hepatitis B infection possibly related to a needlestick in the past.  Serologies performed in 2021 showed that she has hepatitis B surface antigen negative, surface antigen and core antibody positive.  Her laboratory parameters are indicative of resolved infection without ongoing hepatitis B..   Plan   Complete course of H. pylori treatment with rifabutin , amoxicillin  and PPI [employer] stool kit provided today to confirm H. pylori eradication 4 to 8 weeks after completing treatment After completing H. pylori treatment may resume pantoprazole  40 mg p.o. twice daily Continue baclofen  10 mg p.o. nightly -this can be further dose optimized if needed. Ongoing anemia monitoring in conjunction with hematology oncology At next visit I will recommend follow-up labs for monitoring of HBV.    Planned Follow Up 4 months  The patient or caregiver verbalized understanding of the material covered, with no barriers to understanding. All questions were answered. Patient or caregiver is agreeable with the plan outlined above.    It was a pleasure to see Jojo.  If you have any questions or concerns regarding this evaluation, do not hesitate to contact me.  Inocente Hausen, MD [facility]

## 2024-06-07 ENCOUNTER — Other Ambulatory Visit

## 2024-06-07 ENCOUNTER — Ambulatory Visit: Admitting: Pediatrics

## 2024-06-07 ENCOUNTER — Telehealth: Payer: Self-pay

## 2024-06-07 ENCOUNTER — Encounter: Payer: Self-pay | Admitting: Pediatrics

## 2024-06-07 VITALS — BP 140/80 | HR 69 | Ht 60.63 in | Wt 138.4 lb

## 2024-06-07 DIAGNOSIS — K219 Gastro-esophageal reflux disease without esophagitis: Secondary | ICD-10-CM

## 2024-06-07 DIAGNOSIS — Z8619 Personal history of other infectious and parasitic diseases: Secondary | ICD-10-CM

## 2024-06-07 DIAGNOSIS — R1013 Epigastric pain: Secondary | ICD-10-CM

## 2024-06-07 DIAGNOSIS — D509 Iron deficiency anemia, unspecified: Secondary | ICD-10-CM | POA: Diagnosis not present

## 2024-06-07 DIAGNOSIS — Z8719 Personal history of other diseases of the digestive system: Secondary | ICD-10-CM | POA: Diagnosis not present

## 2024-06-07 DIAGNOSIS — A048 Other specified bacterial intestinal infections: Secondary | ICD-10-CM

## 2024-06-07 DIAGNOSIS — R12 Heartburn: Secondary | ICD-10-CM | POA: Diagnosis not present

## 2024-06-07 DIAGNOSIS — G8929 Other chronic pain: Secondary | ICD-10-CM

## 2024-06-07 DIAGNOSIS — B1910 Unspecified viral hepatitis B without hepatic coma: Secondary | ICD-10-CM

## 2024-06-07 LAB — HEPATIC FUNCTION PANEL
ALT: 16 U/L (ref 3–35)
AST: 11 U/L (ref 5–37)
Albumin: 4.3 g/dL (ref 3.5–5.2)
Alkaline Phosphatase: 40 U/L (ref 39–117)
Bilirubin, Direct: 0.1 mg/dL (ref 0.1–0.3)
Total Bilirubin: 0.5 mg/dL (ref 0.2–1.2)
Total Protein: 6.9 g/dL (ref 6.0–8.3)

## 2024-06-07 LAB — GAMMA GT: GGT: 8 U/L (ref 7–51)

## 2024-06-07 MED ORDER — VOQUEZNA 20 MG PO TABS: 1.0000 | ORAL_TABLET | Freq: Every day | ORAL | Status: DC

## 2024-06-07 NOTE — Patient Instructions (Addendum)
 It has been recommended to you by your physician that you have a(n) PH Impendency Manometry completed.  We did not schedule the procedure today. We will contact you once it is scheduled.   Your provider has requested that you go to the basement level for lab work before leaving today. Press B on the elevator. The lab is located at the first door on the left as you exit the elevator.  Due to recent changes in healthcare laws, you may see the results of your imaging and laboratory studies on MyChart before your provider has had a chance to review them.  We understand that in some cases there may be results that are confusing or concerning to you. Not all laboratory results come back in the same time frame and the provider may be waiting for multiple results in order to interpret others.  Please give us  48 hours in order for your provider to thoroughly review all the results before contacting the office for clarification of your results.   We have given you samples of the following medication to take: Voquezna  20 mg once daily.    Thank you for entrusting me with your care and for choosing [facility], Dr. Inocente Hausen  _______________________________________________________  If your blood pressure at your visit was 140/90 or greater, please contact your primary care physician to follow up on this.  _______________________________________________________  If you are age 39 or older, your body mass index should be between 23-30. Your Body mass index is 26.47 kg/m. If this is out of the aforementioned range listed, please consider follow up with your Primary Care Provider.  If you are age 39 or younger, your body mass index should be between 19-25. Your Body mass index is 26.47 kg/m. If this is out of the aformentioned range listed, please consider follow up with your Primary Care Provider.   ________________________________________________________  The [facility] providers would like  to encourage you to use MYCHART to communicate with providers for non-urgent requests or questions.  Due to long hold times on the telephone, sending your provider a message by [facility] may be a faster and more efficient way to get a response.  Please allow 48 business hours for a response.  Please remember that this is for non-urgent requests.  _______________________________________________________  [facility] is using a team-based approach to care.  Your team is made up of your doctor and two to three APPS. Our APPS (Nurse Practitioners and Physician Assistants) work with your physician to ensure care continuity for you. They are fully qualified to address your health concerns and develop a treatment plan. They communicate directly with your gastroenterologist to care for you. Seeing the Advanced Practice Practitioners on your physician's team can help you by facilitating care more promptly, often allowing for earlier appointments, access to diagnostic testing, procedures, and other specialty referrals.

## 2024-06-07 NOTE — Telephone Encounter (Signed)
 Patient was scheduled for pH impedence and manometry on 09/20/24 at 12:30 pm.

## 2024-06-08 ENCOUNTER — Telehealth: Payer: Self-pay

## 2024-06-08 NOTE — Telephone Encounter (Signed)
-----   Message from Inocente Hausen, MD sent at 06/07/2024  5:03 PM EST ----- Regarding: FW: pH Impedance on/off Tx Hi [name] -  Regarding the pH/impedance/manometry we scheduled today for [name] -per Dr. Trenna recommendations below please see if she would be willing to try holding her antacid therapy before the study.  If she feels it will be too difficult we can do the studies while on antacid therapy but will need to note that when interpreting the results.  Thanks,  Inocente ----- Message ----- From: Shila Gustav GAILS, MD Sent: 06/07/2024   4:25 PM EST To: Inocente CHRISTELLA Hausen, MD Subject: RE: pH Impedance on/off Tx                     If she can tolerate being off PPI, will be helpful to differentiate if this is  functional heartburn or if she truly has uncontrolled significant acid reflux.  If you feel she will not be able to tolerate it then we can do pH impedance on PPI to see if she is having any breakthrough acid reflux episodes on PPI ----- Message ----- From: Hausen Inocente CHRISTELLA, MD Sent: 06/07/2024   2:57 PM EST To: Kavitha Nandigam V, MD Subject: pH Impedance on/off Tx                         Hi Veena -  I am referring [name] for pH/impedance testing due to symptoms of heartburn, reflux and epigastric pain despite trials of various PPI therapies.  She self escalated her doses of PPI to pantoprazole  80 mg p.o. twice daily at one point!  Treated and eradicated H. pylori and now trialing a PCAB.  Wanted to see what your thoughts are about performing pH and impedance on her off of treatment.  Ideally she would be off of her medications for 7 days before testing but I am not sure she will be able to hold them that long.  Since she has symptoms while on PPI I do think it would be reasonable to perform testing while on treatment to see if her breakthrough symptoms are truly acid/nonacid reflux?  Thanks for input you may have.  Inocente

## 2024-06-08 NOTE — Telephone Encounter (Signed)
 Dr. Suzann, were you able to confirm if Gwenna can stay on her PPI prior to her procedure.  She is scheduled 09/20/24.

## 2024-06-08 NOTE — Telephone Encounter (Signed)
 I spoke to Morgan [name] and I reviewed the procedure instructions with her.  She said that she will try holding the medication for 7 days.    She asked if the Voquezna  can cause nausea and a metallic taste.  I advised her that both can be caused by the medication but usually goes away.  She asked what she can take for the nausea and the metallic taste.

## 2024-06-08 NOTE — Telephone Encounter (Signed)
 Disregard message.  Instructions noted in a different note.

## 2024-06-08 NOTE — Addendum Note (Signed)
 Addended by: WILLIEMAE JOLA PARAS on: 06/08/2024 04:09 PM   Modules accepted: Orders

## 2024-06-09 NOTE — Telephone Encounter (Signed)
 Left message for patient to call back

## 2024-06-10 ENCOUNTER — Ambulatory Visit: Payer: Self-pay | Admitting: Pediatrics

## 2024-06-10 LAB — HEPATITIS B DNA, ULTRAQUANTITATIVE, PCR
Hepatitis B DNA: NOT DETECTED [IU]/mL
Hepatitis B virus DNA: NOT DETECTED {Log_IU}/mL

## 2024-06-10 LAB — HEPATITIS B SURFACE ANTIBODY,QUALITATIVE: Hep B S Ab: REACTIVE — AB

## 2024-06-10 LAB — HEPATITIS B SURFACE ANTIGEN: Hepatitis B Surface Ag: NONREACTIVE

## 2024-06-10 LAB — HEPATITIS B CORE ANTIBODY, TOTAL: Hep B Core Total Ab: REACTIVE — AB

## 2024-06-13 NOTE — Telephone Encounter (Signed)
 Spoke with patient & she has been on Voquenza for 1 week now. States her reflux symptoms have improved, however the medication does make her nauseous & has a metallic taste. She has tried taking it at different times of the day & also with/without food. She says it is tolerable, and she is going to try & give it another week to see if symptoms resolve. Advised her to give us  a call next week with a symptom update. Pt verbalized all understanding.

## 2024-06-21 ENCOUNTER — Ambulatory Visit: Admitting: Family Medicine

## 2024-06-21 ENCOUNTER — Encounter: Payer: Self-pay | Admitting: Family Medicine

## 2024-06-21 ENCOUNTER — Other Ambulatory Visit (HOSPITAL_BASED_OUTPATIENT_CLINIC_OR_DEPARTMENT_OTHER): Payer: Self-pay

## 2024-06-21 ENCOUNTER — Encounter: Payer: Self-pay | Admitting: Oncology

## 2024-06-21 VITALS — BP 112/74 | HR 85 | Ht 60.0 in | Wt 137.0 lb

## 2024-06-21 DIAGNOSIS — Z6826 Body mass index (BMI) 26.0-26.9, adult: Secondary | ICD-10-CM | POA: Diagnosis not present

## 2024-06-21 DIAGNOSIS — F3289 Other specified depressive episodes: Secondary | ICD-10-CM

## 2024-06-21 DIAGNOSIS — E663 Overweight: Secondary | ICD-10-CM | POA: Diagnosis not present

## 2024-06-21 DIAGNOSIS — F5089 Other specified eating disorder: Secondary | ICD-10-CM

## 2024-06-21 DIAGNOSIS — Z9884 Bariatric surgery status: Secondary | ICD-10-CM | POA: Diagnosis not present

## 2024-06-21 DIAGNOSIS — D509 Iron deficiency anemia, unspecified: Secondary | ICD-10-CM | POA: Diagnosis not present

## 2024-06-21 DIAGNOSIS — K219 Gastro-esophageal reflux disease without esophagitis: Secondary | ICD-10-CM

## 2024-06-21 MED ORDER — FERROUS GLUCONATE 324 (38 FE) MG PO TABS
324.0000 mg | ORAL_TABLET | Freq: Every day | ORAL | 1 refills | Status: AC
  Filled 2024-06-21: qty 30, 30d supply, fill #0

## 2024-06-21 MED ORDER — BUPROPION HCL ER (XL) 300 MG PO TB24
300.0000 mg | ORAL_TABLET | Freq: Every day | ORAL | 2 refills | Status: AC
  Filled 2024-06-21: qty 30, 30d supply, fill #0

## 2024-06-21 NOTE — Progress Notes (Signed)
 "  Office: [phone]  /  Fax: [phone]  WEIGHT SUMMARY AND BIOMETRICS  Starting Date: 10/07/22  Starting Weight: 154lb   Weight Lost Since Last Visit: 2lb   Vitals BP: 112/74 Pulse Rate: 85 SpO2: 100 %   Body Composition  Body Fat %: 40.2 % Fat Mass (lbs): 55 lbs Muscle Mass (lbs): 77.8 lbs Total Body Water (lbs): 68 lbs Visceral Fat Rating : 7     HPI  Chief Complaint: OBESITY  Durene is here to discuss her progress with her obesity treatment plan. She is on the following a lower carbohydrate, vegetable and lean protein rich diet plan and states she is following her eating plan approximately 30 % of the time. She states she is exercising 0 minutes 0 times per week.   Interval History:  Since last office visit she is down 2 lb Muscle mass reduced 2.2 lb and body fat has reduced 0.2 lb She did move Qsymia  7.5/46 mg to every other day with less palpitations She does feel like Qsymia  was affecting mood and sleep She has not had time to get to the gym and has not been consistent with walking She has been studying for board exam She has good control around sweets She has a net weight loss of 11 lb in 1.5 years She is doing well with weight loss post VGS with a 136 lb nadir weight She struggles to balance priorities with her family and self care  Pharmacotherapy: none (ran out of Qsymia  2 weeks ago)  PHYSICAL EXAM:  Blood pressure 112/74, pulse 85, height 5' (1.524 m), weight 137 lb (62.1 kg), SpO2 100%. Body mass index is 26.76 kg/m.  General: She is overweight, cooperative, alert, well developed, and in no acute distress. PSYCH: Has normal mood, affect and thought process.   Lungs: Normal breathing effort, no conversational dyspnea.   ASSESSMENT AND PLAN  TREATMENT PLAN FOR OBESITY:  Recommended Dietary Goals  Revonda is currently in the action stage of change. As such, her goal is to continue weight management plan. She has agreed to following a  lower carbohydrate, vegetable and lean protein rich diet plan.  Behavioral Intervention  We discussed the following Behavioral Modification Strategies today: increasing lean protein intake to established goals, increasing fiber rich foods, increasing water intake , work on meal planning and preparation, keeping healthy foods at home, continue to practice mindfulness when eating, and planning for success.  Additional resources provided today: NA  Recommended Physical Activity Goals  Stephaney has been advised to work up to 150 minutes of moderate intensity aerobic activity a week and strengthening exercises 2-3 times per week for cardiovascular health, weight loss maintenance and preservation of muscle mass.   She has agreed to [employer] strengthening exercises with a goal of 2-3 sessions a week  and Increase and monitor steps for a goal of 10,000 per day Has a lot of room for improvement with regular exercise to help maintain body weight  Pharmacotherapy changes for the treatment of obesity: d/c Qsymia   ASSOCIATED CONDITIONS ADDRESSED TODAY  S/P laparoscopic sleeve gastrectomy Congratulated her on achieving a healthy body weight and reminded her that she does not need additional weight loss.  Continue to work on small portions, lean protein with meals and drinking water between meals  Overweight (BMI 25.0-29.9) Improving Reviewed maintenance plan of weight of weight loss  Gastroesophageal reflux disease, unspecified whether esophagitis present Seeing Dr Suzann and is off pantoprazole , on Voquezna  daily with plans for pH probe  Other depression with emotional eating -     buPROPion  HCl ER (XL); Take 1 tablet (300 mg total) by mouth daily.  Dispense: 30 tablet; Refill: 2 Improving on medication + CBT  Iron deficiency anemia, unspecified iron deficiency anemia type -     Ferrous Gluconate ; Take 1 tablet (324 mg total) by mouth daily with breakfast.  Dispense: 30 tablet; Refill: 1 Has f/u  with heme/ onc scheduled Heart palpitations have improved on RX iron daily     She was informed of the importance of frequent follow up visits to maximize her success with intensive lifestyle modifications for her multiple health conditions.   ATTESTASTION STATEMENTS:  Reviewed by clinician on day of visit: allergies, medications, problem list, medical history, surgical history, family history, social history, and previous encounter notes pertinent to obesity diagnosis.   I have personally spent 30 minutes total time today in preparation, patient care, nutritional counseling and education,  and documentation for this visit, including the following: review of most recent clinical lab tests, prescribing medications/ refilling medications, reviewing medical assistant documentation, review and interpretation of bioimpedence results.     Darice Haddock, D.O. DABFM, DABOM [facility] [address] [place], [place] [address] [phone] "

## 2024-06-23 ENCOUNTER — Other Ambulatory Visit (HOSPITAL_BASED_OUTPATIENT_CLINIC_OR_DEPARTMENT_OTHER): Payer: Self-pay

## 2024-06-23 ENCOUNTER — Other Ambulatory Visit: Payer: Self-pay

## 2024-06-23 ENCOUNTER — Other Ambulatory Visit (HOSPITAL_COMMUNITY): Payer: Self-pay

## 2024-06-23 ENCOUNTER — Encounter: Payer: Self-pay | Admitting: Pediatrics

## 2024-06-23 MED ORDER — VOQUEZNA 20 MG PO TABS
1.0000 | ORAL_TABLET | Freq: Every day | ORAL | 0 refills | Status: AC
  Filled 2024-06-23: qty 30, 30d supply, fill #0
  Filled 2024-07-24: qty 30, 30d supply, fill #1

## 2024-06-23 MED ORDER — VOQUEZNA 20 MG PO TABS: 1.0000 | ORAL_TABLET | Freq: Every day | ORAL | 0 refills | Status: DC

## 2024-06-23 NOTE — Telephone Encounter (Signed)
 Inbound call from patient requesting to get a refill on the voquenza. Patient is wanting to know if she will be prescribed this medication or will she need to get more samples. Patient is requesting a call back .Please advise.

## 2024-06-24 ENCOUNTER — Encounter: Payer: Self-pay | Admitting: Oncology

## 2024-06-24 ENCOUNTER — Other Ambulatory Visit (HOSPITAL_BASED_OUTPATIENT_CLINIC_OR_DEPARTMENT_OTHER): Payer: Self-pay

## 2024-06-26 ENCOUNTER — Telehealth: Payer: Self-pay

## 2024-06-26 ENCOUNTER — Other Ambulatory Visit (HOSPITAL_COMMUNITY): Payer: Self-pay

## 2024-06-26 NOTE — Telephone Encounter (Signed)
 PA request has been Submitted. New Encounter has been or will be created for follow up. For additional info see Pharmacy Prior Auth telephone encounter from 06-26-2024.

## 2024-06-26 NOTE — Telephone Encounter (Signed)
 Pharmacy Patient Advocate Encounter   Received notification from Patient Advice Request messages that prior authorization for Voquezna  20MG  tablets is required/requested.   Insurance verification completed.   The patient is insured through [employer].   Per test claim: PA required; PA submitted to above mentioned insurance via Latent Key/confirmation #/EOC [id number] Status is pending

## 2024-06-26 NOTE — Telephone Encounter (Signed)
 Pharmacy Patient Advocate Encounter  Received notification from [employer] that Prior Authorization for Voquezna  20MG  tablets has been APPROVED from 06-26-2024 to 06-26-2025. Ran test claim, Copay is $4.00 for a 30 day supply. This test claim was processed through [facility]- copay amounts may vary at other pharmacies due to pharmacy/plan contracts, or as the patient moves through the different stages of their insurance plan.   PA #/Case ID/Reference #: [id number]

## 2024-06-27 ENCOUNTER — Telehealth: Payer: Self-pay | Admitting: Pediatrics

## 2024-06-27 NOTE — Telephone Encounter (Signed)
 Inbound call from patient stating she needs medication voquezna  refilled Please advise  Thank you

## 2024-06-29 ENCOUNTER — Other Ambulatory Visit: Payer: Self-pay

## 2024-06-29 ENCOUNTER — Other Ambulatory Visit (HOSPITAL_BASED_OUTPATIENT_CLINIC_OR_DEPARTMENT_OTHER): Payer: Self-pay

## 2024-06-29 NOTE — Telephone Encounter (Signed)
 Call from pt regarding Voquezna  20MG  tablets not being sent to pharmacy. Pt expressed she has tried numerous attempts to contact clinical team with no avail. Pt stated she is now experiencing more GI pains and acid reflux. Informed pt that prior auth was completed. Pt requesting call to discuss more details. Please advise. Thank you.

## 2024-06-29 NOTE — Telephone Encounter (Signed)
 Spoke with pharmacy & they reviewed medication and stated it has been approved, and patient can pick it up tomorrow. Pt made aware. Pt has a supply on pantoprazole , and has been taking this in the meantime until supply comes in.

## 2024-06-29 NOTE — Telephone Encounter (Signed)
 Called & discussed medication with patient as this was previously sent in last week. Approval for auth was received 1/5, however patient states pharmacy told her today they still needed it. Advised her I would call pharmacy & give her a call back.

## 2024-07-10 ENCOUNTER — Telehealth (INDEPENDENT_AMBULATORY_CARE_PROVIDER_SITE_OTHER): Payer: Self-pay | Admitting: Psychology

## 2024-07-10 ENCOUNTER — Ambulatory Visit: Admitting: Psychology

## 2024-07-10 DIAGNOSIS — F330 Major depressive disorder, recurrent, mild: Secondary | ICD-10-CM | POA: Diagnosis not present

## 2024-07-10 NOTE — Telephone Encounter (Signed)
" ° °                                                            Main Office Phone: [phone] Fax: [phone]  Date of Call: July 10, 2024  Duration of Call: ~1 minute(s) Provider:Asaad Gulley Sharron, PsyD  CONTENT:  This provider called Morgan [name] to check-in as she did not present for today's in-person appointment. She explained she was on her way, noting she thought the appointment was at [facility].  No evidence or endorsement of any safety concerns. All questions/concerns addressed.   PLAN: Morgan [name] is scheduled for an appointment today at 11am in person.    "

## 2024-07-10 NOTE — Progress Notes (Signed)
 "                                                              Main Office Phone: [phone] Fax: [phone]   Date: July 10, 2024    Name: Morgan [name] MRN: [id number] Appointment Start Time: 11:22am Duration: 43 minutes Type of Session: Intake for Individual Therapy  Type of Contact: In Person  Informed Consent: The provider's role was explained to Morgan [name]. The provider reviewed and discussed issues of confidentiality, privacy, and limits therein (e.g., reporting obligations). In addition to verbal informed consent, written informed consent for psychological services was obtained prior to the initial appointment. Since the clinic is not a 24/7 crisis center, mental health emergency resources were shared and this  provider explained MyChart, e-mail, voicemail, and/or other messaging systems should be utilized only for non-emergency reasons. This provider also explained that information obtained during appointments will be securely placed in [facility] medical record. Morgan [name] verbally consented to proceed. All questions/concerns related to new patient paperwork were addressed.   Chief Complaint/HPI: Morgan [name] was previously seen by this provider at [facility] to address engagement in disordered eating behaviors. The last appointment was on 05/30/2024.  During today's appointment, Morgan [name] reported she feels she is on edge all the time, which she internalizes. 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 Morgan [name] further shared feeling sensitive to everything, adding she feels her son's health and bullying are her fault. She further endorsed experiencing symptoms of anxiety and depression. She described experiencing worry regarding the well-being of her children and husband, noting it started in the past year. She described experiencing situational anxiety. She further  discussed periods of depression throughout her life, but noted a belief this current episode is worse.  Mental Status Examination:  Appearance: neat Behavior: appropriate to circumstances Mood: depressed  Affect: mood congruent; tearful Speech: WNL Eye Contact: intermittent Psychomotor Activity: WNL Gait: unable to assess Thought Process: linear, logical, and goal directed and denies suicidal, homicidal, and self-harm ideation, plan and intent  Thought Content/Perception: no hallucinations, delusions, bizarre thinking or behavior endorsed or observed Orientation: AAOx4 Memory/Concentration: intact Insight/Judgment: fair  Family & Psychosocial History: Morgan [name] reported she is married and she has three boys (ages 83, 64, and 5). 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 She further explained she started studying for her boards again, noting it is not easy, but she has a good study partner. Additionally, Morgan [name] shared her highest level of education obtained is a bachelor's degree in medicine and surgery. 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 Currently, Morgan [name] social support system consists of her study partner, sister in law, best friend, sister in [place], mother, father, and husband. Moreover, Christyne stated she resides with her husband, children, and father. Furthermore, she noted the following hobbies: drawing/arts and crafts. She identifies with the following religion: Muslim. She identified the following strengths: caring, self-aware, detail oriented, people oriented, visual learning, ability to analyze, and empathic. She identified the following barriers: limited support from husband.  Medical History:  Past Medical History:  Diagnosis Date   Anemia    Back pain    Complication of anesthesia  Patient states when given the epidural the numbness went up to her nose.    Edema    GERD (gastroesophageal reflux disease)    H. pylori  infection 07/2023   treated with rifabutin , amoxacillin, PPI   Heartburn    Hepatitis    Hepatitis B carrier (HCC)    IBS (irritable bowel syndrome)    Obesity    Osteoarthritis    Palpitations    Pneumonia    Vitamin D  deficiency     Past Surgical History:  Procedure Laterality Date   CARPAL TUNNEL RELEASE     gastric balloon  01/28/2021   ocular muscle corrective surgery     SLEEVE GASTROPLASTY  2022   Current Outpatient Medications  Medication Sig Dispense Refill   acetaminophen  (TYLENOL ) 500 MG tablet Take 500-1,000 mg by mouth every 6 (six) hours as needed for moderate pain (pain score 4-6).     baclofen  (LIORESAL ) 10 MG tablet Take 1 tablet (10 mg total) by mouth at bedtime. (Patient not taking: Reported on 06/21/2024) 90 tablet 3   buPROPion  (WELLBUTRIN  XL) 300 MG 24 hr tablet Take 1 tablet (300 mg total) by mouth daily. 30 tablet 2   cetirizine  (ZYRTEC ) 10 MG tablet Take 1 tablet (10 mg total) by mouth daily. 30 tablet 11   ferrous gluconate  (FERGON) 324 MG tablet Take 1 tablet (324 mg total) by mouth daily with breakfast. 30 tablet 1   fluticasone  (FLONASE ) 50 MCG/ACT nasal spray Place 2 sprays into both nostrils daily. 16 g 6   Multiple Vitamin (MULTIVITAMIN) tablet Take 1 tablet by mouth daily.     ondansetron  (ZOFRAN ) 4 MG tablet Take 1 tablet (4 mg total) by mouth every 8 (eight) hours as needed for nausea or vomiting. 20 tablet 0   sucralfate  (CARAFATE ) 1 GM/10ML suspension Take 10 mLs (1 g total) by mouth 4 (four) times daily. 420 mL 3   TWIRLA 120-30 MCG/24HR PTWK Apply 1 patch topically once a week.     Vonoprazan Fumarate  (VOQUEZNA ) 20 MG TABS Take 1 tablet by mouth daily. 90 tablet 0   No current facility-administered medications for this visit.    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 She previously met with this provider to address disordered eating behaviors with [facility], but denied hx of  any other mental heath treatment.  She reported there is no history of psychotropic medications. Alayla reported there is no history of hospitalizations for psychiatric concerns. Morgan [name] denied a family history of mental health/substance abuse related concerns. Furthermore, Morgan [name] reported there is no history of trauma including psychological, physical , and sexual abuse, as well as neglect; however, she described her mother as super protective and controlling.   Morgan [name] denied illicit/recreational substance use. Morgan [name] indicated she is not experiencing the following: hallucinations and delusions, paranoia, symptoms of mania, and panic attacks. She also denied history of and current suicidal ideation, plan, and intent; history of and current homicidal ideation, plan, and intent; and history of and current engagement in self-harm. While future psychiatric events cannot be accurately predicted, the patient does not currently require acute inpatient psychiatric care and does not currently meet Morgan [name]  involuntary commitment criteria.  Legal History: Morgan [name] reported there is no history of legal involvement.   Structured Assessments Results: The Patient Health Questionnaire-9 (PHQ-9) is a self-report measure that assesses symptoms and severity of depression over the course of the last two weeks. Morgan [name] obtained a score  of 5 suggesting mild depression. Morgan [name] finds the endorsed symptoms to be somewhat difficult. [0= Not at all; 1= Several days; 2= More than half the days; 3= Nearly every day] Little interest or pleasure in doing things 1  Feeling down, depressed, or hopeless 1  Trouble falling or staying asleep, or sleeping too much 1  Feeling tired or having little energy 1  Poor appetite or overeating 0  Feeling bad about yourself --- or that you are a failure or have let yourself or your family down 1  Trouble concentrating on things, such as reading the newspaper or watching television 0  Moving or  speaking so slowly that other people could have noticed? Or the opposite --- being so fidgety or restless that you have been moving around a lot more than usual 0  Thoughts that you would be better off dead or hurting yourself in some way 0  PHQ-9 Score 5    The Generalized Anxiety Disorder-7 (GAD-7) is a brief self-report measure that assesses symptoms of anxiety over the course of the last two weeks. Morgan [name] obtained a score of 6 suggesting mild anxiety. Morgan [name] finds the endorsed symptoms to be somewhat difficult. [0= Not at all; 1= Several days; 2= Over half the days; 3= Nearly every day] Feeling nervous, anxious, on edge 2  Not being able to stop or control worrying 1  Worrying too much about different things 0  Trouble relaxing 0  Being so restless that it's hard to sit still 0  Becoming easily annoyed or irritable 1  Feeling afraid as if something awful might happen 1  GAD-7 Score 6   Interventions:  Conducted a chart review Verbally administered Food & Mood questionnaire to assess various behaviors related to emotional eating Provided emphatic reflections and validation Conducted a risk assessment Conducted a clinical interview   Provisional DSM-5 Diagnosis(es): Morgan [name] endorsed experiencing anxiety and depression related symptoms. She described current anxiety as situational as it relates to her family's well-being. She discussed experiencing depression throughout her life. As such, the following diagnosis was assigned: F33.0 Major Depressive Disorder, Recurrent Episode, Mild, With Anxious Distress  Plan: Morgan [name] appears able and willing to participate as evidenced engagement in reciprocal conversation and asking questions as needed for clarification. The next appointment is scheduled for 07/24/2024 at 9am, which will be in person. For homework, Morgan [name] was encouraged to reflect on goal(s) for treatment, as a treatment plan will be developed at the next appointment. This provider will regularly  review the treatment plan and medical chart to keep informed of status changes.    Wyatt SHAUNNA Fire, PsyD        [1]  Allergies Allergen Reactions   Metronidazole Other (See Comments)    Severe hypotension   "

## 2024-07-14 ENCOUNTER — Other Ambulatory Visit (HOSPITAL_BASED_OUTPATIENT_CLINIC_OR_DEPARTMENT_OTHER): Payer: Self-pay

## 2024-07-14 MED ORDER — ETONOGESTREL-ETHINYL ESTRADIOL 0.12-0.015 MG/24HR VA RING
VAGINAL_RING | VAGINAL | 3 refills | Status: AC
  Filled 2024-07-14: qty 3, 84d supply, fill #0

## 2024-07-18 ENCOUNTER — Other Ambulatory Visit (HOSPITAL_BASED_OUTPATIENT_CLINIC_OR_DEPARTMENT_OTHER): Payer: Self-pay

## 2024-07-18 NOTE — Progress Notes (Signed)
" °  °                              Main Office Phone: [phone] Fax: [phone]   Behavioral Health Treatment Plan Name: Sritha Chauncey [facility] Insurance: [employer] PREPAID HEALTH PLAN (ID: [id number]; Group #: [id number])  MRN: [id number] Treatment Plan Development Date: July 18, 2024    Strengths: self-awareness; willingness to process past events   Supports: Study partner, friends, sister (resides in [place]), husband, and parents (reside in [place])   Client Statement of Needs: Vernel reported a desire to learn how to place safe boundaries for [herself].   Treatment Level: Outpatient individual therapeutic services    Client Treatment Preferences: Meigan described a desire to have an opportunity to process events and aspects of her life she feels she is unable to discuss with others in a non-judgmental and empathic manner. She noted a preference of scheduling appointments every two weeks based on her schedule and responsibilities.   Diagnosis: F33.0 Major Depressive Disorder, Recurrent Episode, Mild, With Anxious Distress   Symptoms: Challenges with attention, concentration, and forgetfulness; depressed mood; low motivation; fatigue; sleep disturbances    Goal:  Reduce symptoms of depression and improve day-to-day functioning    Objectives (Target Date For All Objectives: 07/24/2025):  Describe experiences with depression including impact on functioning and attempts to resolve Identify, challenge, and replace thoughts and beliefs that support depression Learn and implement coping strategies, such as mindfulness, thought defusion, problem-solving, radical acceptance, opposite action    Progress Documentation: Initial treatment plan     Interventions: Eclectic therapeutic approach utilizing techniques from Cognitive Behavioral Therapy (CBT), Client Centered Therapy, Dialectical Behavior Therapy (CBT), Interpersonal Therapy, and Acceptance and Commitment Therapy (ACT);  therapeutic approach will include various interventions, such as validation, reflections, mindfulness, thought defusion, cognitive reframing, boundary setting, values assessment, psychoeducation, motivational interviewing, relaxation training, goal setting, problem solving   Expected duration of treatment: Evaluate after 12 months of treatment    Party responsible for implementation of interventions: Nataliya (patient) and Kayelee Herbig P Karmello Abercrombie, PsyD. (Provider)   This plan has been reviewed and created by the following participants: Katilynn (patient) and Casaundra Takacs P Oriyah Lamphear, PsyD. (Provider)   A new plan will be created at least every 12 months.   The patient fully participated in the development of treatment plan with the provider and verbally consents to such treatment.    Patient Treatment Plan Signature Obtained: Lisl provided verbal approval of this treatment plan and provided verbal consent to proceed with services. This treatment plan will be sent to [facility] electronically to be signed and returned prior to the next appointment.      Wyatt SHAUNNA Fire, PsyD      "

## 2024-07-19 ENCOUNTER — Other Ambulatory Visit (HOSPITAL_BASED_OUTPATIENT_CLINIC_OR_DEPARTMENT_OTHER): Payer: Self-pay

## 2024-07-19 MED ORDER — DOXYCYCLINE HYCLATE 100 MG PO TABS
100.0000 mg | ORAL_TABLET | Freq: Two times a day (BID) | ORAL | 0 refills | Status: AC
  Filled 2024-07-19: qty 14, 7d supply, fill #0

## 2024-07-24 ENCOUNTER — Ambulatory Visit: Admitting: Psychology

## 2024-07-24 DIAGNOSIS — F330 Major depressive disorder, recurrent, mild: Secondary | ICD-10-CM

## 2024-07-24 NOTE — Progress Notes (Signed)
" ° ° °                                                                           Main Office Phone: [phone] Fax: [phone]  Date: July 24, 2024  Name: Morgan [name] MRN: [id number] Appointment Start Time: 9:03am Duration: 56 minutes Type of Session: Individual Therapy  Location of Patient: Home Location of Provider: Provider's Home (private office) Type of Contact: Telepsychological Visit via [employer] (video and audio capabilities)  Session Content: Today's appointment was a telepsychological visit. Morgan [name] provided verbal consent for today's telepsychological appointment and she is aware she is responsible for securing confidentiality on her end of the session. Prior to proceeding with today's appointment, Morgan [name]'s physical location at the time of this appointment was obtained as well a phone number she could be reached at in the event of technical difficulties. Morgan [name] and this provider participated in today's telepsychological service.   This provider conducted a brief check-in and established an agenda for today's appointment. Morgan [name] and this provider collaborated on developing a treatment plan. Discussed diagnosis. Morgan [name] provided verbal approval of this treatment plan and provided verbal consent to proceed with services. This treatment plan will be signed by Morgan [name] after this appointment. Moreover, Morgan [name] shared about recent stressors (e.g., remote school), adding she was crying and described feeling guilty for her current emotional status. Further explored and associated thoughts and feelings were processed. Overall, Morgan [name] was receptive to today's appointment as evidenced by openness to sharing and responsiveness to feedback.  Mental Status Examination:  Appearance: neat Behavior: appropriate to circumstances Mood: depressed Affect: mood congruent Speech: WNL Eye Contact: appropriate Psychomotor Activity: appropriate Gait: unable to assess Thought Process: linear, logical, and  goal directed and no evidence or endorsement of suicidal, homicidal, and self-harm ideation, plan and intent  Thought Content/Perception: no hallucinations, delusions, bizarre thinking or behavior endorsed or observed Orientation: AAOx4 Memory/Concentration: intact Insight/Judgment: fair  Interventions:  Conducted a brief chart review Provided empathic reflections and validation Processed thoughts and feelings Developed a treatment plan Challenged unhelpful thinking patterns  Provided positive reinforcement   DSM-5 Diagnosis(es): F33.0 Major Depressive Disorder, Recurrent Episode, Mild, With Anxious Distress  Treatment Goal(s) & Progress: Progress is limited as treatment was recently initiated and the treatment plan was developed during today's appointment. Treatment plan is attached to this encounter.  Plan: The next appointment is scheduled for 08/07/2024 at 9am, which will be in person. The next session will focus on working towards the established treatment goal. Morgan [name] will be sent the treatment plan signature page electronically, which will be signed and returned prior to the next appointment.     Wyatt SHAUNNA Fire, PsyD "

## 2024-07-24 NOTE — Addendum Note (Signed)
 Addended by: SHARRON WYATT SQUIBB on: 07/24/2024 11:13 AM   Modules accepted: Level of Service

## 2024-07-27 ENCOUNTER — Other Ambulatory Visit (HOSPITAL_BASED_OUTPATIENT_CLINIC_OR_DEPARTMENT_OTHER): Payer: Self-pay

## 2024-08-02 ENCOUNTER — Ambulatory Visit: Admitting: Family Medicine

## 2024-08-07 ENCOUNTER — Ambulatory Visit: Admitting: Psychology

## 2024-08-14 ENCOUNTER — Inpatient Hospital Stay: Admitting: Oncology

## 2024-08-14 ENCOUNTER — Inpatient Hospital Stay

## 2024-09-20 ENCOUNTER — Encounter (HOSPITAL_COMMUNITY): Payer: Self-pay

## 2024-09-20 ENCOUNTER — Ambulatory Visit (HOSPITAL_COMMUNITY): Admit: 2024-09-20 | Admitting: Pediatrics
